# Patient Record
Sex: Female | Born: 1935 | Race: White | Hispanic: No | Smoking: Never smoker
Health system: Southern US, Community
[De-identification: ages and names within clinical notes are randomized; demographics above are authoritative.]

## PROBLEM LIST (undated history)

## (undated) DIAGNOSIS — R928 Other abnormal and inconclusive findings on diagnostic imaging of breast: Secondary | ICD-10-CM

## (undated) DIAGNOSIS — M1990 Unspecified osteoarthritis, unspecified site: Secondary | ICD-10-CM

## (undated) DIAGNOSIS — I219 Acute myocardial infarction, unspecified: Secondary | ICD-10-CM

## (undated) DIAGNOSIS — M810 Age-related osteoporosis without current pathological fracture: Secondary | ICD-10-CM

## (undated) HISTORY — DX: Other abnormal and inconclusive findings on diagnostic imaging of breast: R92.8

## (undated) HISTORY — PX: APPENDECTOMY: SHX54

## (undated) HISTORY — DX: Unspecified osteoarthritis, unspecified site: M19.90

## (undated) HISTORY — PX: CARDIAC CATHETERIZATION: SHX172

## (undated) HISTORY — DX: Age-related osteoporosis without current pathological fracture: M81.0

---

## 2004-08-12 ENCOUNTER — Ambulatory Visit: Payer: Self-pay | Admitting: Internal Medicine

## 2005-09-19 ENCOUNTER — Ambulatory Visit: Payer: Self-pay | Admitting: Internal Medicine

## 2006-11-15 ENCOUNTER — Ambulatory Visit: Payer: Self-pay | Admitting: Internal Medicine

## 2007-11-19 ENCOUNTER — Ambulatory Visit: Payer: Self-pay | Admitting: Internal Medicine

## 2008-11-19 ENCOUNTER — Ambulatory Visit: Payer: Self-pay | Admitting: Internal Medicine

## 2009-11-23 ENCOUNTER — Ambulatory Visit: Payer: Self-pay | Admitting: Internal Medicine

## 2010-06-26 DIAGNOSIS — R928 Other abnormal and inconclusive findings on diagnostic imaging of breast: Secondary | ICD-10-CM

## 2010-06-26 HISTORY — DX: Other abnormal and inconclusive findings on diagnostic imaging of breast: R92.8

## 2010-08-31 ENCOUNTER — Ambulatory Visit: Payer: Self-pay | Admitting: Ophthalmology

## 2010-12-07 ENCOUNTER — Ambulatory Visit: Payer: Self-pay | Admitting: Internal Medicine

## 2010-12-08 ENCOUNTER — Ambulatory Visit: Payer: Self-pay | Admitting: Internal Medicine

## 2011-06-12 ENCOUNTER — Ambulatory Visit: Payer: Self-pay | Admitting: Surgery

## 2011-06-27 HISTORY — PX: BREAST BIOPSY: SHX20

## 2011-07-03 ENCOUNTER — Ambulatory Visit: Payer: Self-pay | Admitting: Surgery

## 2011-07-04 LAB — PATHOLOGY REPORT

## 2012-02-01 ENCOUNTER — Ambulatory Visit: Payer: Self-pay | Admitting: Internal Medicine

## 2013-02-03 ENCOUNTER — Ambulatory Visit: Payer: Self-pay | Admitting: Internal Medicine

## 2013-11-07 ENCOUNTER — Emergency Department: Payer: Self-pay | Admitting: Emergency Medicine

## 2014-02-24 DIAGNOSIS — M1990 Unspecified osteoarthritis, unspecified site: Secondary | ICD-10-CM | POA: Insufficient documentation

## 2015-03-04 ENCOUNTER — Other Ambulatory Visit: Payer: Self-pay | Admitting: Family Medicine

## 2015-03-04 DIAGNOSIS — Z1231 Encounter for screening mammogram for malignant neoplasm of breast: Secondary | ICD-10-CM

## 2015-03-04 DIAGNOSIS — M810 Age-related osteoporosis without current pathological fracture: Secondary | ICD-10-CM | POA: Insufficient documentation

## 2015-03-10 ENCOUNTER — Other Ambulatory Visit: Payer: Self-pay | Admitting: Family Medicine

## 2015-03-10 ENCOUNTER — Ambulatory Visit
Admission: RE | Admit: 2015-03-10 | Discharge: 2015-03-10 | Disposition: A | Discharge status: Home / Self Care | Payer: Medicare Other | Source: Ambulatory Visit | Attending: Family Medicine | Admitting: Family Medicine

## 2015-03-10 DIAGNOSIS — Z1231 Encounter for screening mammogram for malignant neoplasm of breast: Secondary | ICD-10-CM | POA: Diagnosis not present

## 2015-03-11 HISTORY — PX: SKIN BIOPSY: SHX1

## 2015-03-24 ENCOUNTER — Encounter: Payer: Self-pay | Admitting: General Surgery

## 2015-03-24 ENCOUNTER — Telehealth: Payer: Self-pay | Admitting: General Surgery

## 2015-03-24 ENCOUNTER — Ambulatory Visit (INDEPENDENT_AMBULATORY_CARE_PROVIDER_SITE_OTHER): Payer: Medicare Other | Admitting: General Surgery

## 2015-03-24 VITALS — BP 194/80 | HR 88 | Temp 88.0°F | Ht 66.0 in | Wt 125.0 lb

## 2015-03-24 DIAGNOSIS — C439 Malignant melanoma of skin, unspecified: Secondary | ICD-10-CM

## 2015-03-24 DIAGNOSIS — Z01818 Encounter for other preprocedural examination: Secondary | ICD-10-CM

## 2015-03-24 NOTE — Telephone Encounter (Signed)
Pt advised of pre op date/time and sx date in the office by the Nurse. Sx: 03/26/15 with Dr Faythe Ghee excision right upper back. Pre op: 03/25/15 @11 :00am.--office.

## 2015-03-24 NOTE — Progress Notes (Signed)
Patient ID: Tanya Roy, female   DOB: 1928/12/09, 81 y.o.   MRN: [id number] CC: Melanoma HPI Tanya Roy is a 81 y.o. female presents to clinic for evaluation of a left upper back melanoma. Patient reports that she is in a usual state of good health until her annual follow-up with her primary care doctor discovered a area of concern of her left upper back. 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 She is otherwise in excellent health. She denies any chronic medications other than over-the-counter multivitamin. She's never had any heart disease. Her only recent medical misadventure was a hip fracture which did not require surgery. She denies any fevers, chills, nausea, vomiting, chest pain, shortness of breath, or any other malady. She tolerated the office excisional biopsy without any difficulty.  HPI  Past Medical History  Diagnosis Date  . Osteoporosis   . Arthritis   . Abnormal mammogram of right breast 2012    Microcalcifications    Past Surgical History  Procedure Laterality Date  . Skin biopsy  03/11/2015    Back  . Breast biopsy Right 2013    stereo, neg  . Appendectomy      Family History  Problem Relation Age of Onset  . Tuberculosis Mother   . Heart disease Father     Social History Social History  Substance Use Topics  . Smoking status: Never Smoker   . Smokeless tobacco: None  . Alcohol Use: No    No Known Allergies  Current Outpatient Prescriptions  Medication Sig Dispense Refill  . Multiple Vitamin (MULTIVITAMIN) tablet Take 1 tablet by mouth daily.     No current facility-administered medications for this visit.     Review of Systems A multi-point review of systems was asked and was negative except for the following positive findings: See history of present illness  Physical Exam Blood pressure 194/80, pulse 88, temperature 88  F (31.1 C), temperature source Oral, height 5\' 6"  (1.676 m), weight 56.7 kg (125 lb). CONSTITUTIONAL: No acute distress. EYES: Pupils are equal, round, and reactive to light, Sclera are non-icteric. EARS, NOSE, MOUTH AND THROAT: The oropharynx is clear. The oral mucosa is pink and moist. Hearing is intact to voice. LYMPH NODES:  Lymph nodes in the neck are normal. RESPIRATORY:  Lungs are clear. There is normal respiratory effort, with equal breath sounds bilaterally, and without pathologic use of accessory muscles. CARDIOVASCULAR: Heart is regular without murmurs, gallops, or rubs. GI: The abdomen is soft, nontender, and nondistended. There are no palpable masses. There is no hepatosplenomegaly. There are normal bowel sounds in all quadrants. GU: Rectal deferred.   MUSCULOSKELETAL: Normal muscle strength and tone. No cyanosis or edema.   SKIN: Left upper back with an obvious prior excisional biopsy site. There is a large scab and no evidence of prior attempted closure. The margins around the scab are a pigmented brown in every direction. There are multiple other moles on her back that he does not have any concerning features. They are round, small, with consistent coloring and borders. NEUROLOGIC: Motor and sensation is grossly normal. Cranial nerves are grossly intact. PSYCH:  Oriented to person, place and time. Affect is normal.  Data Reviewed I personally reviewed the pathology report provided by the primary care doctor shows a melanoma in situ with a positive lateral margin. The specimen was not orientated per the pathologist there for  which margin is impossible to distract. The maximum depth of invasion is 0.16 mm. I have personally reviewed the patient's imaging, laboratory findings and medical records.    Assessment    81 year old female with melanoma in situ and a positive margin.    Plan    Discussed with the patient and her niece diagnosis of melanoma in situ and the positive  margin from her excisional biopsy. Discussed given that we cannot say laterality the entirety of the previous excision sites to be reexcised. The procedure was discussed in detail including that it would be safer and easier the patient be performed in the operating room rather than clinic. There is open and available operating time on this Friday is also an opening in my clinic to allow to do this.  The procedure of wide local excision for margins was discussed in detail to include the risks, benefits, alternatives. Patient and her niece both voiced understanding wished proceed. We will perform preoperative workup today with plans for excisional biopsy on Friday.     Time spent with the patient was 30 minutes, with more than 50% of the time spent in face-to-face education, counseling and care coordination.     Clayburn Pert 03/24/2015, 11:14 AM

## 2015-03-24 NOTE — Patient Instructions (Signed)
Angie will be contacting to let you know when your surgery will be. If you have any questions or concerns before your surgery, please give [place] a call.

## 2015-03-25 ENCOUNTER — Encounter
Admission: RE | Admit: 2015-03-25 | Discharge: 2015-03-25 | Disposition: A | Discharge status: Home / Self Care | Payer: Medicare Other | Source: Ambulatory Visit | Attending: General Surgery | Admitting: General Surgery

## 2015-03-25 DIAGNOSIS — M1990 Unspecified osteoarthritis, unspecified site: Secondary | ICD-10-CM | POA: Diagnosis not present

## 2015-03-25 DIAGNOSIS — D0361 Melanoma in situ of right upper limb, including shoulder: Secondary | ICD-10-CM | POA: Diagnosis not present

## 2015-03-25 DIAGNOSIS — Z8249 Family history of ischemic heart disease and other diseases of the circulatory system: Secondary | ICD-10-CM | POA: Diagnosis not present

## 2015-03-25 DIAGNOSIS — C439 Malignant melanoma of skin, unspecified: Secondary | ICD-10-CM | POA: Diagnosis present

## 2015-03-25 DIAGNOSIS — Z79899 Other long term (current) drug therapy: Secondary | ICD-10-CM | POA: Diagnosis not present

## 2015-03-25 DIAGNOSIS — D2261 Melanocytic nevi of right upper limb, including shoulder: Secondary | ICD-10-CM | POA: Diagnosis not present

## 2015-03-25 DIAGNOSIS — M810 Age-related osteoporosis without current pathological fracture: Secondary | ICD-10-CM | POA: Diagnosis not present

## 2015-03-25 DIAGNOSIS — Z836 Family history of other diseases of the respiratory system: Secondary | ICD-10-CM | POA: Diagnosis not present

## 2015-03-25 DIAGNOSIS — R920 Mammographic microcalcification found on diagnostic imaging of breast: Secondary | ICD-10-CM | POA: Diagnosis not present

## 2015-03-25 LAB — PROTIME-INR
INR: 1.06
Prothrombin Time: 14 seconds (ref 11.4–15.0)

## 2015-03-25 LAB — CBC WITH DIFFERENTIAL/PLATELET
BASOS ABS: 0 10*3/uL (ref 0–0.1)
BASOS PCT: 1 %
Eosinophils Absolute: 0.1 10*3/uL (ref 0–0.7)
Eosinophils Relative: 2 %
HEMATOCRIT: 41.3 % (ref 35.0–47.0)
HEMOGLOBIN: 13.9 g/dL (ref 12.0–16.0)
Lymphocytes Relative: 19 %
Lymphs Abs: 1.2 10*3/uL (ref 1.0–3.6)
MCH: 32.9 pg (ref 26.0–34.0)
MCHC: 33.7 g/dL (ref 32.0–36.0)
MCV: 97.7 fL (ref 80.0–100.0)
Monocytes Absolute: 0.5 10*3/uL (ref 0.2–0.9)
Monocytes Relative: 9 %
NEUTROS ABS: 4.6 10*3/uL (ref 1.4–6.5)
NEUTROS PCT: 71 %
Platelets: 177 10*3/uL (ref 150–440)
RBC: 4.23 MIL/uL (ref 3.80–5.20)
RDW: 12.9 % (ref 11.5–14.5)
WBC: 6.5 10*3/uL (ref 3.6–11.0)

## 2015-03-25 LAB — BASIC METABOLIC PANEL
ANION GAP: 5 (ref 5–15)
BUN: 20 mg/dL (ref 6–20)
CALCIUM: 8.9 mg/dL (ref 8.9–10.3)
CO2: 27 mmol/L (ref 22–32)
Chloride: 105 mmol/L (ref 101–111)
Creatinine, Ser: 0.86 mg/dL (ref 0.44–1.00)
Glucose, Bld: 102 mg/dL — ABNORMAL HIGH (ref 65–99)
Potassium: 3.9 mmol/L (ref 3.5–5.1)
Sodium: 137 mmol/L (ref 135–145)

## 2015-03-25 NOTE — OR Nursing (Signed)
Ekg called to Dr Ronelle Nigh and ok to proceed

## 2015-03-25 NOTE — Patient Instructions (Signed)
  Your procedure is scheduled on: 03/26/15 Report to Day Surgery. [facility]OND FLOOR To find out your arrival time please call [phone] between 1PM - 3PM on 03/25/15.  Remember: Instructions that are not followed completely may result in serious medical risk, up to and including death, or upon the discretion of your surgeon and anesthesiologist your surgery may need to be rescheduled.    __X__ 1. Do not eat food or drink liquids after midnight. No gum chewing or hard candies.     _X___ 2. No Alcohol for 24 hours before or after surgery.   ____ 3. Bring all medications with you on the day of surgery if instructed.    __X__ 4. Notify your doctor if there is any change in your medical condition     (cold, fever, infections).     Do not wear jewelry, make-up, hairpins, clips or nail polish.  Do not wear lotions, powders, or perfumes. You may wear deodorant.  Do not shave 48 hours prior to surgery. Men may shave face and neck.  Do not bring valuables to the hospital.    [facility] is not responsible for any belongings or valuables.               Contacts, dentures or bridgework may not be worn into surgery.  Leave your suitcase in the car. After surgery it may be brought to your room.  For patients admitted to the hospital, discharge time is determined by your                treatment team.   Patients discharged the day of surgery will not be allowed to drive home.   Please read over the following fact sheets that you were given:   Surgical Site Infection Prevention   ____ Take these medicines the morning of surgery with A SIP OF WATER:    1.   2.   3.   4.  5.  6.  ____ Fleet Enema (as directed)   __X__ Use CHG Soap as directed  ____ Use inhalers on the day of surgery  ____ Stop metformin 2 days prior to surgery    ____ Take 1/2 of usual insulin dose the night before surgery and none on the morning of surgery.   ____ Stop Coumadin/Plavix/aspirin on  ____  Stop Anti-inflammatories on    ____ Stop supplements until after surgery.    ____ Bring C-Pap to the hospital.

## 2015-03-26 ENCOUNTER — Ambulatory Visit
Admission: RE | Admit: 2015-03-26 | Discharge: 2015-03-26 | Disposition: A | Discharge status: Home / Self Care | Payer: Medicare Other | Source: Ambulatory Visit | Attending: General Surgery | Admitting: General Surgery

## 2015-03-26 ENCOUNTER — Ambulatory Visit: Payer: Medicare Other | Admitting: *Deleted

## 2015-03-26 ENCOUNTER — Encounter: Payer: Self-pay | Admitting: *Deleted

## 2015-03-26 ENCOUNTER — Encounter
Admission: RE | Disposition: A | Discharge status: Home / Self Care | Payer: Self-pay | Source: Ambulatory Visit | Attending: General Surgery

## 2015-03-26 DIAGNOSIS — D0361 Melanoma in situ of right upper limb, including shoulder: Secondary | ICD-10-CM | POA: Diagnosis not present

## 2015-03-26 DIAGNOSIS — R920 Mammographic microcalcification found on diagnostic imaging of breast: Secondary | ICD-10-CM | POA: Insufficient documentation

## 2015-03-26 DIAGNOSIS — M1990 Unspecified osteoarthritis, unspecified site: Secondary | ICD-10-CM | POA: Insufficient documentation

## 2015-03-26 DIAGNOSIS — Z8249 Family history of ischemic heart disease and other diseases of the circulatory system: Secondary | ICD-10-CM | POA: Insufficient documentation

## 2015-03-26 DIAGNOSIS — D2261 Melanocytic nevi of right upper limb, including shoulder: Secondary | ICD-10-CM | POA: Insufficient documentation

## 2015-03-26 DIAGNOSIS — M810 Age-related osteoporosis without current pathological fracture: Secondary | ICD-10-CM | POA: Insufficient documentation

## 2015-03-26 DIAGNOSIS — Z79899 Other long term (current) drug therapy: Secondary | ICD-10-CM | POA: Insufficient documentation

## 2015-03-26 DIAGNOSIS — D0362 Melanoma in situ of left upper limb, including shoulder: Secondary | ICD-10-CM | POA: Diagnosis not present

## 2015-03-26 DIAGNOSIS — Z836 Family history of other diseases of the respiratory system: Secondary | ICD-10-CM | POA: Insufficient documentation

## 2015-03-26 HISTORY — PX: MELANOMA EXCISION: SHX5266

## 2015-03-26 SURGERY — EXCISION, MELANOMA
Anesthesia: Monitor Anesthesia Care | Site: Back | Laterality: Right | Wound class: Clean

## 2015-03-26 MED ORDER — CHLORHEXIDINE GLUCONATE 4 % EX LIQD: 1.0000 "application " | Freq: Once | CUTANEOUS | Status: DC

## 2015-03-26 MED ORDER — BUPIVACAINE HCL 0.5 % IJ SOLN
INTRAMUSCULAR | Status: DC | PRN
  Administered 2015-03-26: 10 mL

## 2015-03-26 MED ORDER — LACTATED RINGERS IV SOLN
INTRAVENOUS | Status: DC
  Administered 2015-03-26: 13:00:00 via INTRAVENOUS

## 2015-03-26 MED ORDER — BUPIVACAINE HCL (PF) 0.5 % IJ SOLN
INTRAMUSCULAR | Status: AC
Start: 2015-03-26 — End: 2015-03-26
  Filled 2015-03-26: qty 30

## 2015-03-26 MED ORDER — CEFAZOLIN SODIUM-DEXTROSE 2-3 GM-% IV SOLR: 2.0000 g | INTRAVENOUS | Status: DC

## 2015-03-26 MED ORDER — BUPIVACAINE-EPINEPHRINE (PF) 0.25% -1:200000 IJ SOLN
INTRAMUSCULAR | Status: AC
  Filled 2015-03-26: qty 30

## 2015-03-26 MED ORDER — CEFAZOLIN SODIUM-DEXTROSE 2-3 GM-% IV SOLR
INTRAVENOUS | Status: AC
  Filled 2015-03-26: qty 50

## 2015-03-26 MED ORDER — LIDOCAINE HCL 1 % IJ SOLN
INTRAMUSCULAR | Status: DC | PRN
  Administered 2015-03-26: 10 mL

## 2015-03-26 MED ORDER — FENTANYL CITRATE (PF) 100 MCG/2ML IJ SOLN: 25.0000 ug | INTRAMUSCULAR | Status: DC | PRN

## 2015-03-26 MED ORDER — CEFAZOLIN SODIUM-DEXTROSE 2-3 GM-% IV SOLR
2.0000 g | INTRAVENOUS | Status: AC
  Administered 2015-03-26: 2 g via INTRAVENOUS

## 2015-03-26 MED ORDER — HYDROCODONE-ACETAMINOPHEN 5-325 MG PO TABS: 1.0000 | ORAL_TABLET | Freq: Four times a day (QID) | ORAL | Status: DC | PRN

## 2015-03-26 MED ORDER — FAMOTIDINE 20 MG PO TABS
20.0000 mg | ORAL_TABLET | Freq: Once | ORAL | Status: AC
  Administered 2015-03-26: 20 mg via ORAL

## 2015-03-26 MED ORDER — DEXAMETHASONE SODIUM PHOSPHATE 4 MG/ML IJ SOLN
8.0000 mg | Freq: Once | INTRAMUSCULAR | Status: DC | PRN
  Filled 2015-03-26: qty 2

## 2015-03-26 MED ORDER — LIDOCAINE HCL (PF) 1 % IJ SOLN
INTRAMUSCULAR | Status: AC
  Filled 2015-03-26: qty 30

## 2015-03-26 MED ORDER — FAMOTIDINE 20 MG PO TABS
ORAL_TABLET | ORAL | Status: AC
  Administered 2015-03-26: 20 mg via ORAL
  Filled 2015-03-26: qty 1

## 2015-03-26 MED ORDER — MIDAZOLAM HCL 2 MG/2ML IJ SOLN
INTRAMUSCULAR | Status: DC | PRN
  Administered 2015-03-26 (×2): 1 mg via INTRAVENOUS

## 2015-03-26 MED ORDER — FENTANYL CITRATE (PF) 100 MCG/2ML IJ SOLN
INTRAMUSCULAR | Status: DC | PRN
  Administered 2015-03-26 (×2): 50 ug via INTRAVENOUS

## 2015-03-26 SURGICAL SUPPLY — 27 items
BLADE SURG 15 STRL LF DISP TIS (BLADE) ×1 IMPLANT
BLADE SURG 15 STRL SS (BLADE) ×2
CHLORAPREP W/TINT 26ML (MISCELLANEOUS) ×3 IMPLANT
DRAIN PENROSE 1/4X12 LTX (DRAIN) IMPLANT
DRAPE LAPAROTOMY 100X77 ABD (DRAPES) ×3 IMPLANT
DRAPE SHEET LG 3/4 BI-LAMINATE (DRAPES) ×3 IMPLANT
ELECT CAUTERY BLADE 6.4 (BLADE) ×3 IMPLANT
GLOVE BIO SURGEON STRL SZ8 (GLOVE) ×3 IMPLANT
GOWN STRL REUS W/ TWL LRG LVL3 (GOWN DISPOSABLE) ×2 IMPLANT
GOWN STRL REUS W/TWL LRG LVL3 (GOWN DISPOSABLE) ×4
LABEL OR SOLS (LABEL) IMPLANT
MARGIN MAP 10MM (MISCELLANEOUS) IMPLANT
NEEDLE HYPO 22GX1.5 SAFETY (NEEDLE) ×3 IMPLANT
NS IRRIG 500ML POUR BTL (IV SOLUTION) ×3 IMPLANT
PACK BASIN MINOR ARMC (MISCELLANEOUS) ×3 IMPLANT
PAD ABD DERMACEA PRESS 5X9 (GAUZE/BANDAGES/DRESSINGS) IMPLANT
PAD GROUND ADULT SPLIT (MISCELLANEOUS) ×3 IMPLANT
SPONGE LAP 18X18 5 PK (GAUZE/BANDAGES/DRESSINGS) ×3 IMPLANT
SUT ETHILON 2 0 FS 18 (SUTURE) ×6 IMPLANT
SUT ETHILON 3-0 FS-10 30 BLK (SUTURE)
SUT SILK 2 0 SH (SUTURE) ×3 IMPLANT
SUT VIC AB 2-0 CT2 27 (SUTURE) IMPLANT
SUT VIC AB 3-0 SH 27 (SUTURE)
SUT VIC AB 3-0 SH 27X BRD (SUTURE) IMPLANT
SUTURE EHLN 3-0 FS-10 30 BLK (SUTURE) IMPLANT
SYR BULB EAR ULCER 3OZ GRN STR (SYRINGE) IMPLANT
SYRINGE 10CC LL (SYRINGE) ×3 IMPLANT

## 2015-03-26 NOTE — Anesthesia Preprocedure Evaluation (Signed)
Anesthesia Evaluation  Patient identified by MRN, date of birth, ID band Patient awake    Reviewed: Allergy & Precautions, NPO status , Patient's Chart, lab work & pertinent test results  Airway Mallampati: II  TM Distance: >3 FB Neck ROM: Limited    Dental  (+) Partial Lower   Pulmonary    Pulmonary exam normal        Cardiovascular Exercise Tolerance: Good negative cardio ROS Normal cardiovascular exam     Neuro/Psych    GI/Hepatic negative GI ROS,   Endo/Other    Renal/GU      Musculoskeletal  (+) Arthritis , Osteoarthritis,  Hx of osteoporosis.   Abdominal (+)  Abdomen: soft.    Peds  Hematology   Anesthesia Other Findings   Reproductive/Obstetrics                             Anesthesia Physical Anesthesia Plan  ASA: II  Anesthesia Plan: MAC   Post-op Pain Management:    Induction: Intravenous  Airway Management Planned: Nasal Cannula and Simple Face Mask  Additional Equipment:   Intra-op Plan:   Post-operative Plan:   Informed Consent:   Plan Discussed with: CRNA  Anesthesia Plan Comments: (If excision is not wide appropriate for MAC local, prone--if needed we can provide GA.)        Anesthesia Quick Evaluation

## 2015-03-26 NOTE — Anesthesia Postprocedure Evaluation (Signed)
  Anesthesia Post-op Note  Patient: Tanya Roy  Procedure(s) Performed: Procedure(s): MELANOMA EXCISION (Right)  Anesthesia type:MAC  Patient location: PACU  Post pain: Pain level controlled  Post assessment: Post-op Vital signs reviewed, Patient's Cardiovascular Status Stable, Respiratory Function Stable, Patent Airway and No signs of Nausea or vomiting  Post vital signs: Reviewed and stable  Last Vitals:  Filed Vitals:   03/26/15 1417  BP: 148/67  Pulse: 65  Temp:   Resp: 16    Level of consciousness: awake, alert  and patient cooperative  Complications: No apparent anesthesia complications

## 2015-03-26 NOTE — Transfer of Care (Signed)
Immediate Anesthesia Transfer of Care Note  Patient: Tanya Roy  Procedure(s) Performed: Procedure(s): MELANOMA EXCISION (Right)  Patient Location: PACU  Anesthesia Type:MAC  Level of Consciousness: awake, alert  and oriented  Airway & Oxygen Therapy: Patient Spontanous Breathing and Patient connected to nasal cannula oxygen  Post-op Assessment: Report given to RN and Post -op Vital signs reviewed and stable  Post vital signs: Reviewed and stable  Last Vitals:  Filed Vitals:   03/26/15 1400  BP: 140/87  Pulse: 70  Temp: 36.9 C  Resp: 15    Complications: No apparent anesthesia complications

## 2015-03-26 NOTE — Op Note (Signed)
Wide Local Excision of Melanoma Site  Pre-operative Diagnosis: Melanoma In-situ  Post-operative Diagnosis: Same  Surgeon: Juanda Crumble T. Adonis Huguenin, MD FACS  Anesthesia: Moderate Sedation with Local Anesthetic  Assistant:none  Procedure Details  The patient was seen again in the Holding Room. The benefits, complications, treatment options, and expected outcomes were discussed with the patient. The risks of bleeding, infection, and possible need for further surgery were reviewed with the patient. The likelihood of improving the patient's symptoms with return to their baseline status is good.  The patient and/or family concurred with the proposed plan, giving informed consent.  The patient was taken to Operating Room, identified as Tanya Roy and the procedure verified.  A Time Out was held and the above information confirmed.  Prior to the induction of anesthesia, antibiotic prophylaxis was administered. VTE prophylaxis was in place. Moderate Sedation was then administered and tolerated well. After the induction, the back was prepped with Chloraprep and draped in the sterile fashion. The patient was positioned in the prone position.   The prior melanoma excision site was marked with a 5 mm margin in all directions. Once the entirety of the specimen was measured the transverse direction was then doubled and with for a 2-1 ratio. Once this was done the entire area was localized with a 50-50 mixture of 1% lidocaine and 0.5% Marcaine then using a 15 blade scalpel marked skin site was incised down to the subcutaneous fat and sharply removed from all the deeper tissues. The specimen was then marked with a silk suture long lateral and short superior. The area was made hemostatic with direct electrocautery. The entire operative site was then re-localized the aforementioned local anesthetic. Subcutaneous flaps were then created at the center of the incision site to ensure that the skin edges would meet in the  middle. Then using interrupted 2-0 nylon suture in a vertical mattress fashion the skin was closed with ease.  A sterile dressing of gauze and tape was placed over this. Patient tolerated procedure well was awoken from monitored anesthesia and transferred to the PACU in good condition.  Findings:   Prior Melanoma site   Estimated Blood Loss: 20         Drains: none         Specimens: left shoulder melanoma           Complications: none              Charles T. Adonis Huguenin, MD, FACS

## 2015-03-26 NOTE — Interval H&P Note (Signed)
History and Physical Interval Note:  03/26/2015 12:46 PM  Tanya Roy  has presented today for surgery, with the diagnosis of Melanoma In-Situ  The various methods of treatment have been discussed with the patient and family. After consideration of risks, benefits and other options for treatment, the patient has consented to  Procedure(s): MELANOMA EXCISION (N/A) as a surgical intervention .  The patient's history has been reviewed, patient examined, no change in status, stable for surgery.  I have reviewed the patient's chart and labs.  Questions were answered to the patient's satisfaction.     Clayburn Pert

## 2015-03-26 NOTE — H&P (View-Only) (Signed)
Patient ID: [name], female   DOB: 1929-06-09, 81 y.o.   MRN: [id number] CC: Melanoma HPI Tanya Roy is a 81 y.o. female presents to clinic for evaluation of a left upper back melanoma. Patient reports that she is in a usual state of good health until her annual follow-up with her primary care doctor discovered a area of concern of her left upper back. 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 She is otherwise in excellent health. She denies any chronic medications other than over-the-counter multivitamin. She's never had any heart disease. Her only recent medical misadventure was a hip fracture which did not require surgery. She denies any fevers, chills, nausea, vomiting, chest pain, shortness of breath, or any other malady. She tolerated the office excisional biopsy without any difficulty.  HPI  Past Medical History  Diagnosis Date  . Osteoporosis   . Arthritis   . Abnormal mammogram of right breast 2012    Microcalcifications    Past Surgical History  Procedure Laterality Date  . Skin biopsy  03/11/2015    Back  . Breast biopsy Right 2013    stereo, neg  . Appendectomy      Family History  Problem Relation Age of Onset  . Tuberculosis Mother   . Heart disease Father     Social History Social History  Substance Use Topics  . Smoking status: Never Smoker   . Smokeless tobacco: None  . Alcohol Use: No    No Known Allergies  Current Outpatient Prescriptions  Medication Sig Dispense Refill  . Multiple Vitamin (MULTIVITAMIN) tablet Take 1 tablet by mouth daily.     No current facility-administered medications for this visit.     Review of Systems A multi-point review of systems was asked and was negative except for the following positive findings: See history of present illness  Physical Exam Blood pressure 194/80, pulse 88, temperature 88  F (31.1 C), temperature source Oral, height 5\' 6"  (1.676 m), weight 56.7 kg (125 lb). CONSTITUTIONAL: No acute distress. EYES: Pupils are equal, round, and reactive to light, Sclera are non-icteric. EARS, NOSE, MOUTH AND THROAT: The oropharynx is clear. The oral mucosa is pink and moist. Hearing is intact to voice. LYMPH NODES:  Lymph nodes in the neck are normal. RESPIRATORY:  Lungs are clear. There is normal respiratory effort, with equal breath sounds bilaterally, and without pathologic use of accessory muscles. CARDIOVASCULAR: Heart is regular without murmurs, gallops, or rubs. GI: The abdomen is soft, nontender, and nondistended. There are no palpable masses. There is no hepatosplenomegaly. There are normal bowel sounds in all quadrants. GU: Rectal deferred.   MUSCULOSKELETAL: Normal muscle strength and tone. No cyanosis or edema.   SKIN: Left upper back with an obvious prior excisional biopsy site. There is a large scab and no evidence of prior attempted closure. The margins around the scab are a pigmented brown in every direction. There are multiple other moles on her back that he does not have any concerning features. They are round, small, with consistent coloring and borders. NEUROLOGIC: Motor and sensation is grossly normal. Cranial nerves are grossly intact. PSYCH:  Oriented to person, place and time. Affect is normal.  Data Reviewed I personally reviewed the pathology report provided by the primary care doctor shows a melanoma in situ with a positive lateral margin. The specimen was not orientated per the pathologist there for  which margin is impossible to distract. The maximum depth of invasion is 0.16 mm. I have personally reviewed the patient's imaging, laboratory findings and medical records.    Assessment    81 year old female with melanoma in situ and a positive margin.    Plan    Discussed with the patient and her niece diagnosis of melanoma in situ and the positive  margin from her excisional biopsy. Discussed given that we cannot say laterality the entirety of the previous excision sites to be reexcised. The procedure was discussed in detail including that it would be safer and easier the patient be performed in the operating room rather than clinic. There is open and available operating time on this Friday is also an opening in my clinic to allow to do this.  The procedure of wide local excision for margins was discussed in detail to include the risks, benefits, alternatives. Patient and her niece both voiced understanding wished proceed. We will perform preoperative workup today with plans for excisional biopsy on Friday.     Time spent with the patient was 30 minutes, with more than 50% of the time spent in face-to-face education, counseling and care coordination.     Tanya Roy 03/24/2015, 11:14 AM

## 2015-03-26 NOTE — Brief Op Note (Signed)
03/26/2015  1:54 PM  PATIENT:  [name]  81 y.o. female  PRE-OPERATIVE DIAGNOSIS:  Melanoma In-Situ  POST-OPERATIVE DIAGNOSIS:  Melanoma In-Situ  PROCEDURE:  Procedure(s): MELANOMA EXCISION (Right)  SURGEON:  Surgeon(s) and Role:    * Clayburn Pert, MD - Primary  PHYSICIAN ASSISTANT:   ASSISTANTS: none   ANESTHESIA:   MAC  EBL:  Total I/O In: 50 [I.V.:50] Out: -   BLOOD ADMINISTERED:none  DRAINS: none   LOCAL MEDICATIONS USED:  MARCAINE   , XYLOCAINE  and Amount: 20 ml  SPECIMEN:  Excision of prior melanoma site  DISPOSITION OF SPECIMEN:  PATHOLOGY  COUNTS:  YES  TOURNIQUET:  * No tourniquets in log *  DICTATION: .Dragon Dictation  PLAN OF CARE: Discharge to home after PACU  PATIENT DISPOSITION:  PACU - hemodynamically stable.   Delay start of Pharmacological VTE agent (>24hrs) due to surgical blood loss or risk of bleeding: not applicable

## 2015-04-01 LAB — SURGICAL PATHOLOGY

## 2015-04-06 ENCOUNTER — Telehealth: Payer: Self-pay

## 2015-04-06 NOTE — Telephone Encounter (Addendum)
Received phone call from patient's niece upset that she has not heard results or patient's pathology from Melanoma surgery. I explained that this was reviewed by the physician and will be reviewed with patient at follow-up appointment. She states that she will be attending her follow-up appointment alone.   Reviewed pathology over the phone but explained that I am not a physician and cannot give any recommendations for anything further needed.  I explained that I would like to move up her appointment with Dr. Adonis Huguenin tomorrow so that she can speak with him, she is unable to get patient to an appointment in [facility] office at all and this is why she is seeing Dr. Marina Gravel in [place] on 04/14/15.  She would like for Dr. Adonis Huguenin to know "that we dropped the ball on this and he needs to call her to speak with her about results and recommendations." I explained that I would get in touch with Dr. Adonis Huguenin and the Physician or someone from our office would be returning the phone call.

## 2015-04-06 NOTE — Telephone Encounter (Signed)
Notified Dr. Adonis Huguenin. He will call patient's niece tomorrow when he is in clinic.

## 2015-04-07 ENCOUNTER — Telehealth: Payer: Self-pay | Admitting: General Surgery

## 2015-04-07 NOTE — Telephone Encounter (Signed)
Called niece and apologized for not calling last week due to the delay in results from the biopsy.  Discussed pathology results in detail in that it showed melanoma in situ with a 4-55mm clear margin in all directions. Discussed that all that remains to do is to remove the sutures and then to continue observation of all of her skin to watch for more melanoma formations or local recurrence.  She voiced understanding and states that she will bring her Aunt to clinic next week for a wound check and suture removal.  Clayburn Pert, MD [facility]

## 2015-04-09 ENCOUNTER — Telehealth: Payer: Self-pay | Admitting: Surgery

## 2015-04-09 NOTE — Telephone Encounter (Signed)
Patient left voice message that she had a phone call from our office.

## 2015-04-09 NOTE — Telephone Encounter (Signed)
Called patient back and asked how I could help her. She stated that she had seen that somebody from our office had contacted her. I told her that she might of received a call to remind her of her appointment for Wednesday 04/14/2015 with Dr. Marina Gravel at 1:45 PM. Patient repeated the appointment information and stated that she will come to her appointment. I also confirmed with her that it was in the [place] office at the [employer] building. Patient understood.

## 2015-04-14 ENCOUNTER — Encounter: Payer: Self-pay | Admitting: Surgery

## 2015-04-14 ENCOUNTER — Encounter (INDEPENDENT_AMBULATORY_CARE_PROVIDER_SITE_OTHER): Payer: Self-pay

## 2015-04-14 ENCOUNTER — Ambulatory Visit (INDEPENDENT_AMBULATORY_CARE_PROVIDER_SITE_OTHER): Payer: Medicare Other | Admitting: Surgery

## 2015-04-14 VITALS — BP 187/81 | HR 64 | Temp 97.5°F | Ht 66.0 in | Wt 128.0 lb

## 2015-04-14 DIAGNOSIS — C439 Malignant melanoma of skin, unspecified: Secondary | ICD-10-CM

## 2015-04-14 NOTE — Progress Notes (Signed)
Surgery clinic.  She is here for suture removal following wide local excision of right upper back melanoma in situ. Pathology was discussed with her by Dr. Adonis Huguenin over the telephone.  Physical examination her incision is healed nicely. Sutures were removed.  We will see her back in the office as needed I encouraged her to continue routine dermatological evaluations.

## 2015-04-14 NOTE — Patient Instructions (Signed)
Please give [place] a call if you have any questions or concerns.  Please continue to see your Dermatologist for your yearly check-ups.

## 2016-03-06 ENCOUNTER — Other Ambulatory Visit: Payer: Self-pay | Admitting: Family Medicine

## 2016-03-06 DIAGNOSIS — Z1231 Encounter for screening mammogram for malignant neoplasm of breast: Secondary | ICD-10-CM

## 2016-03-23 ENCOUNTER — Ambulatory Visit
Admission: RE | Admit: 2016-03-23 | Discharge: 2016-03-23 | Disposition: A | Discharge status: Home / Self Care | Payer: Medicare Other | Source: Ambulatory Visit | Attending: Family Medicine | Admitting: Family Medicine

## 2016-03-23 DIAGNOSIS — Z1231 Encounter for screening mammogram for malignant neoplasm of breast: Secondary | ICD-10-CM | POA: Diagnosis present

## 2016-08-29 ENCOUNTER — Inpatient Hospital Stay
Admission: EM | Admit: 2016-08-29 | Discharge: 2016-09-01 | DRG: 481 | Disposition: A | Discharge status: Skilled Nursing Facility | Payer: Medicare Other | Attending: Internal Medicine | Admitting: Internal Medicine

## 2016-08-29 ENCOUNTER — Emergency Department: Payer: Medicare Other

## 2016-08-29 ENCOUNTER — Inpatient Hospital Stay: Payer: Medicare Other | Admitting: Anesthesiology

## 2016-08-29 ENCOUNTER — Inpatient Hospital Stay: Payer: Medicare Other

## 2016-08-29 ENCOUNTER — Encounter
Admission: EM | Disposition: A | Discharge status: Skilled Nursing Facility | Payer: Self-pay | Source: Home / Self Care | Attending: Internal Medicine

## 2016-08-29 ENCOUNTER — Encounter: Payer: Self-pay | Admitting: Emergency Medicine

## 2016-08-29 DIAGNOSIS — M810 Age-related osteoporosis without current pathological fracture: Secondary | ICD-10-CM | POA: Diagnosis present

## 2016-08-29 DIAGNOSIS — W19XXXA Unspecified fall, initial encounter: Secondary | ICD-10-CM

## 2016-08-29 DIAGNOSIS — D62 Acute posthemorrhagic anemia: Secondary | ICD-10-CM | POA: Diagnosis not present

## 2016-08-29 DIAGNOSIS — Z8781 Personal history of (healed) traumatic fracture: Secondary | ICD-10-CM

## 2016-08-29 DIAGNOSIS — S72141A Displaced intertrochanteric fracture of right femur, initial encounter for closed fracture: Principal | ICD-10-CM | POA: Diagnosis present

## 2016-08-29 DIAGNOSIS — Z419 Encounter for procedure for purposes other than remedying health state, unspecified: Secondary | ICD-10-CM

## 2016-08-29 DIAGNOSIS — S52501A Unspecified fracture of the lower end of right radius, initial encounter for closed fracture: Secondary | ICD-10-CM | POA: Diagnosis present

## 2016-08-29 DIAGNOSIS — M1990 Unspecified osteoarthritis, unspecified site: Secondary | ICD-10-CM | POA: Diagnosis present

## 2016-08-29 DIAGNOSIS — Z79899 Other long term (current) drug therapy: Secondary | ICD-10-CM | POA: Diagnosis not present

## 2016-08-29 DIAGNOSIS — W010XXA Fall on same level from slipping, tripping and stumbling without subsequent striking against object, initial encounter: Secondary | ICD-10-CM | POA: Diagnosis present

## 2016-08-29 DIAGNOSIS — Y92014 Private driveway to single-family (private) house as the place of occurrence of the external cause: Secondary | ICD-10-CM | POA: Diagnosis not present

## 2016-08-29 DIAGNOSIS — Z7982 Long term (current) use of aspirin: Secondary | ICD-10-CM

## 2016-08-29 DIAGNOSIS — R030 Elevated blood-pressure reading, without diagnosis of hypertension: Secondary | ICD-10-CM | POA: Diagnosis not present

## 2016-08-29 DIAGNOSIS — Z9889 Other specified postprocedural states: Secondary | ICD-10-CM

## 2016-08-29 DIAGNOSIS — S72001A Fracture of unspecified part of neck of right femur, initial encounter for closed fracture: Secondary | ICD-10-CM

## 2016-08-29 DIAGNOSIS — S72009A Fracture of unspecified part of neck of unspecified femur, initial encounter for closed fracture: Secondary | ICD-10-CM

## 2016-08-29 HISTORY — PX: FEMUR IM NAIL: SHX1597

## 2016-08-29 HISTORY — PX: ORIF WRIST FRACTURE: SHX2133

## 2016-08-29 LAB — CBC
HEMATOCRIT: 40.3 % (ref 35.0–47.0)
HEMOGLOBIN: 13.9 g/dL (ref 12.0–16.0)
MCH: 33.1 pg (ref 26.0–34.0)
MCHC: 34.6 g/dL (ref 32.0–36.0)
MCV: 95.7 fL (ref 80.0–100.0)
Platelets: 178 10*3/uL (ref 150–440)
RBC: 4.21 MIL/uL (ref 3.80–5.20)
RDW: 13.2 % (ref 11.5–14.5)
WBC: 7.1 10*3/uL (ref 3.6–11.0)

## 2016-08-29 LAB — BASIC METABOLIC PANEL
Anion gap: 7 (ref 5–15)
BUN: 22 mg/dL — AB (ref 6–20)
CHLORIDE: 105 mmol/L (ref 101–111)
CO2: 24 mmol/L (ref 22–32)
Calcium: 8.7 mg/dL — ABNORMAL LOW (ref 8.9–10.3)
Creatinine, Ser: 0.92 mg/dL (ref 0.44–1.00)
GFR calc Af Amer: >60 mL/min (ref >60)
GFR calc non Af Amer: 55 mL/min — ABNORMAL LOW (ref >60)
GLUCOSE: 112 mg/dL — AB (ref 65–99)
POTASSIUM: 3.9 mmol/L (ref 3.5–5.1)
SODIUM: 136 mmol/L (ref 135–145)

## 2016-08-29 LAB — PROTIME-INR
INR: 1.01
PROTHROMBIN TIME: 13.3 s (ref 11.4–15.2)

## 2016-08-29 LAB — TYPE AND SCREEN
ABO/RH(D): O POS
ANTIBODY SCREEN: NEGATIVE

## 2016-08-29 LAB — APTT: aPTT: 27 seconds (ref 24–36)

## 2016-08-29 SURGERY — INSERTION, INTRAMEDULLARY ROD, FEMUR
Anesthesia: General | Laterality: Right

## 2016-08-29 MED ORDER — ADULT MULTIVITAMIN W/MINERALS CH
1.0000 | ORAL_TABLET | Freq: Every day | ORAL | Status: DC
  Administered 2016-08-30 – 2016-09-01 (×3): 1 via ORAL
  Filled 2016-08-29 (×4): qty 1

## 2016-08-29 MED ORDER — CEFAZOLIN IN D5W 1 GM/50ML IV SOLN
1.0000 g | Freq: Once | INTRAVENOUS | Status: AC
  Administered 2016-08-29: 1 g via INTRAVENOUS

## 2016-08-29 MED ORDER — ROCURONIUM BROMIDE 100 MG/10ML IV SOLN
INTRAVENOUS | Status: DC | PRN
  Administered 2016-08-29: 5 mg via INTRAVENOUS

## 2016-08-29 MED ORDER — LIDOCAINE HCL (PF) 2 % IJ SOLN
INTRAMUSCULAR | Status: AC
  Filled 2016-08-29: qty 2

## 2016-08-29 MED ORDER — NEOMYCIN-POLYMYXIN B GU 40-200000 IR SOLN
Status: DC | PRN
  Administered 2016-08-29: 4 mL

## 2016-08-29 MED ORDER — FENTANYL CITRATE (PF) 100 MCG/2ML IJ SOLN
INTRAMUSCULAR | Status: DC | PRN
  Administered 2016-08-29 (×4): 25 ug via INTRAVENOUS

## 2016-08-29 MED ORDER — SODIUM CHLORIDE 0.9 % IV SOLN
INTRAVENOUS | Status: DC | PRN
  Administered 2016-08-29: 16:00:00 via INTRAVENOUS

## 2016-08-29 MED ORDER — LIDOCAINE HCL (CARDIAC) 20 MG/ML IV SOLN
INTRAVENOUS | Status: DC | PRN
  Administered 2016-08-29: 60 mg via INTRAVENOUS

## 2016-08-29 MED ORDER — ONDANSETRON HCL 4 MG/2ML IJ SOLN
INTRAMUSCULAR | Status: DC | PRN
  Administered 2016-08-29: 4 mg via INTRAVENOUS

## 2016-08-29 MED ORDER — SUCCINYLCHOLINE CHLORIDE 20 MG/ML IJ SOLN
INTRAMUSCULAR | Status: DC | PRN
  Administered 2016-08-29: 100 mg via INTRAVENOUS

## 2016-08-29 MED ORDER — EPHEDRINE SULFATE 50 MG/ML IJ SOLN
INTRAMUSCULAR | Status: DC | PRN
  Administered 2016-08-29: 10 mg via INTRAVENOUS
  Administered 2016-08-29: 5 mg via INTRAVENOUS
  Administered 2016-08-29: 10 mg via INTRAVENOUS

## 2016-08-29 MED ORDER — PHENYLEPHRINE HCL 10 MG/ML IJ SOLN
INTRAMUSCULAR | Status: DC | PRN
  Administered 2016-08-29: 100 ug via INTRAVENOUS

## 2016-08-29 MED ORDER — ONDANSETRON HCL 4 MG/2ML IJ SOLN: 4.0000 mg | Freq: Four times a day (QID) | INTRAMUSCULAR | Status: DC | PRN

## 2016-08-29 MED ORDER — SODIUM CHLORIDE 0.9 % IV SOLN
Freq: Once | INTRAVENOUS | Status: AC
  Administered 2016-08-29: 15:00:00 via INTRAVENOUS

## 2016-08-29 MED ORDER — SUCCINYLCHOLINE CHLORIDE 20 MG/ML IJ SOLN
INTRAMUSCULAR | Status: AC
  Filled 2016-08-29: qty 1

## 2016-08-29 MED ORDER — BISACODYL 10 MG RE SUPP: 10.0000 mg | Freq: Every day | RECTAL | Status: DC | PRN

## 2016-08-29 MED ORDER — HEPARIN SODIUM (PORCINE) 5000 UNIT/ML IJ SOLN: 5000.0000 [IU] | Freq: Three times a day (TID) | INTRAMUSCULAR | Status: DC

## 2016-08-29 MED ORDER — FENTANYL CITRATE (PF) 100 MCG/2ML IJ SOLN
25.0000 ug | Freq: Once | INTRAMUSCULAR | Status: AC
  Administered 2016-08-29: 25 ug via INTRAVENOUS
  Filled 2016-08-29: qty 2

## 2016-08-29 MED ORDER — PROPOFOL 10 MG/ML IV BOLUS
INTRAVENOUS | Status: DC | PRN
  Administered 2016-08-29: 90 mg via INTRAVENOUS

## 2016-08-29 MED ORDER — ONDANSETRON HCL 4 MG PO TABS: 4.0000 mg | ORAL_TABLET | Freq: Four times a day (QID) | ORAL | Status: DC | PRN

## 2016-08-29 MED ORDER — CEFAZOLIN SODIUM-DEXTROSE 2-4 GM/100ML-% IV SOLN
2.0000 g | Freq: Four times a day (QID) | INTRAVENOUS | Status: AC
  Administered 2016-08-29 – 2016-08-30 (×3): 2 g via INTRAVENOUS
  Filled 2016-08-29 (×4): qty 100

## 2016-08-29 MED ORDER — ACETAMINOPHEN 325 MG PO TABS
650.0000 mg | ORAL_TABLET | Freq: Four times a day (QID) | ORAL | Status: DC | PRN
Start: 2016-08-29 — End: 2016-09-01

## 2016-08-29 MED ORDER — ONDANSETRON HCL 4 MG/2ML IJ SOLN
INTRAMUSCULAR | Status: AC
  Filled 2016-08-29: qty 2

## 2016-08-29 MED ORDER — ENOXAPARIN SODIUM 40 MG/0.4ML ~~LOC~~ SOLN
40.0000 mg | SUBCUTANEOUS | Status: DC
  Administered 2016-08-30 – 2016-09-01 (×3): 40 mg via SUBCUTANEOUS
  Filled 2016-08-29 (×3): qty 0.4

## 2016-08-29 MED ORDER — PROPOFOL 10 MG/ML IV BOLUS
INTRAVENOUS | Status: AC
  Filled 2016-08-29: qty 20

## 2016-08-29 MED ORDER — OXYCODONE-ACETAMINOPHEN 5-325 MG PO TABS: 1.0000 | ORAL_TABLET | ORAL | Status: DC | PRN

## 2016-08-29 MED ORDER — MENTHOL 3 MG MT LOZG
1.0000 | LOZENGE | OROMUCOSAL | Status: DC | PRN
  Filled 2016-08-29: qty 9

## 2016-08-29 MED ORDER — SODIUM CHLORIDE 0.9 % IV SOLN
INTRAVENOUS | Status: DC
  Administered 2016-08-29 – 2016-08-30 (×2): via INTRAVENOUS

## 2016-08-29 MED ORDER — MAGNESIUM CITRATE PO SOLN
1.0000 | Freq: Once | ORAL | Status: DC | PRN
  Filled 2016-08-29 (×2): qty 296

## 2016-08-29 MED ORDER — DOCUSATE SODIUM 100 MG PO CAPS
100.0000 mg | ORAL_CAPSULE | Freq: Two times a day (BID) | ORAL | Status: DC
  Administered 2016-08-29 – 2016-09-01 (×6): 100 mg via ORAL
  Filled 2016-08-29 (×5): qty 1

## 2016-08-29 MED ORDER — METHOCARBAMOL 1000 MG/10ML IJ SOLN
500.0000 mg | Freq: Four times a day (QID) | INTRAVENOUS | Status: DC | PRN
  Filled 2016-08-29: qty 5

## 2016-08-29 MED ORDER — MAGNESIUM HYDROXIDE 400 MG/5ML PO SUSP
30.0000 mL | Freq: Every day | ORAL | Status: DC | PRN
  Administered 2016-08-30 (×2): 30 mL via ORAL
  Filled 2016-08-29 (×3): qty 30

## 2016-08-29 MED ORDER — EPHEDRINE SULFATE 50 MG/ML IJ SOLN
INTRAMUSCULAR | Status: AC
  Filled 2016-08-29: qty 1

## 2016-08-29 MED ORDER — ROCURONIUM BROMIDE 50 MG/5ML IV SOLN
INTRAVENOUS | Status: AC
  Filled 2016-08-29: qty 1

## 2016-08-29 MED ORDER — ASPIRIN EC 81 MG PO TBEC
81.0000 mg | DELAYED_RELEASE_TABLET | Freq: Every day | ORAL | Status: DC
  Administered 2016-08-29 – 2016-09-01 (×4): 81 mg via ORAL
  Filled 2016-08-29 (×4): qty 1

## 2016-08-29 MED ORDER — METOCLOPRAMIDE HCL 10 MG PO TABS: 5.0000 mg | ORAL_TABLET | Freq: Three times a day (TID) | ORAL | Status: DC | PRN

## 2016-08-29 MED ORDER — ONDANSETRON HCL 4 MG/2ML IJ SOLN: 4.0000 mg | Freq: Once | INTRAMUSCULAR | Status: DC | PRN

## 2016-08-29 MED ORDER — FENTANYL CITRATE (PF) 100 MCG/2ML IJ SOLN
INTRAMUSCULAR | Status: AC
  Filled 2016-08-29: qty 2

## 2016-08-29 MED ORDER — ACETAMINOPHEN 650 MG RE SUPP: 650.0000 mg | Freq: Four times a day (QID) | RECTAL | Status: DC | PRN

## 2016-08-29 MED ORDER — FENTANYL CITRATE (PF) 100 MCG/2ML IJ SOLN: 25.0000 ug | INTRAMUSCULAR | Status: DC | PRN

## 2016-08-29 MED ORDER — PHENOL 1.4 % MT LIQD
1.0000 | OROMUCOSAL | Status: DC | PRN
  Filled 2016-08-29: qty 177

## 2016-08-29 MED ORDER — SEVOFLURANE IN SOLN
RESPIRATORY_TRACT | Status: AC
  Filled 2016-08-29: qty 250

## 2016-08-29 MED ORDER — DEXAMETHASONE SODIUM PHOSPHATE 10 MG/ML IJ SOLN
INTRAMUSCULAR | Status: AC
  Filled 2016-08-29: qty 1

## 2016-08-29 MED ORDER — MORPHINE SULFATE (PF) 2 MG/ML IV SOLN: 1.0000 mg | INTRAVENOUS | Status: DC | PRN

## 2016-08-29 MED ORDER — VITAMIN D (ERGOCALCIFEROL) 1.25 MG (50000 UNIT) PO CAPS
50000.0000 [IU] | ORAL_CAPSULE | ORAL | Status: DC
  Administered 2016-08-29: 50000 [IU] via ORAL
  Filled 2016-08-29: qty 1

## 2016-08-29 MED ORDER — DEXAMETHASONE SODIUM PHOSPHATE 10 MG/ML IJ SOLN
INTRAMUSCULAR | Status: DC | PRN
  Administered 2016-08-29: 5 mg via INTRAVENOUS

## 2016-08-29 MED ORDER — ALUM & MAG HYDROXIDE-SIMETH 200-200-20 MG/5ML PO SUSP: 30.0000 mL | ORAL | Status: DC | PRN

## 2016-08-29 MED ORDER — METOCLOPRAMIDE HCL 5 MG/ML IJ SOLN: 5.0000 mg | Freq: Three times a day (TID) | INTRAMUSCULAR | Status: DC | PRN

## 2016-08-29 MED ORDER — ZOLPIDEM TARTRATE 5 MG PO TABS: 5.0000 mg | ORAL_TABLET | Freq: Every evening | ORAL | Status: DC | PRN

## 2016-08-29 MED ORDER — METHOCARBAMOL 500 MG PO TABS
500.0000 mg | ORAL_TABLET | Freq: Four times a day (QID) | ORAL | Status: DC | PRN
  Administered 2016-08-30 – 2016-08-31 (×3): 500 mg via ORAL
  Filled 2016-08-29 (×3): qty 1

## 2016-08-29 MED ORDER — OXYCODONE HCL 5 MG PO TABS
5.0000 mg | ORAL_TABLET | ORAL | Status: DC | PRN
  Administered 2016-08-29 – 2016-08-30 (×2): 5 mg via ORAL
  Administered 2016-08-30: 10 mg via ORAL
  Administered 2016-08-30 – 2016-08-31 (×2): 5 mg via ORAL
  Administered 2016-09-01: 10 mg via ORAL
  Filled 2016-08-29: qty 2
  Filled 2016-08-29: qty 1
  Filled 2016-08-29: qty 2
  Filled 2016-08-29 (×3): qty 1

## 2016-08-29 MED ORDER — DOCUSATE SODIUM 100 MG PO CAPS
100.0000 mg | ORAL_CAPSULE | Freq: Two times a day (BID) | ORAL | Status: DC | PRN
  Administered 2016-08-31: 100 mg via ORAL
  Filled 2016-08-29: qty 1

## 2016-08-29 MED ORDER — CEFAZOLIN SODIUM 1 G IJ SOLR
INTRAMUSCULAR | Status: AC
  Filled 2016-08-29: qty 10

## 2016-08-29 SURGICAL SUPPLY — 64 items
BANDAGE ACE 4X5 VEL STRL LF (GAUZE/BANDAGES/DRESSINGS) ×3 IMPLANT
BANDAGE ELASTIC 4 LF NS (GAUZE/BANDAGES/DRESSINGS) ×3 IMPLANT
BIT DRILL 2.5X4 QC (BIT) ×3 IMPLANT
BIT DRILL 4.3MMS DISTAL GRDTED (BIT) ×1 IMPLANT
BNDG ESMARK 4X12 TAN STRL LF (GAUZE/BANDAGES/DRESSINGS) ×3 IMPLANT
CANISTER SUCT 1200ML W/VALVE (MISCELLANEOUS) ×3 IMPLANT
CHLORAPREP W/TINT 26ML (MISCELLANEOUS) ×3 IMPLANT
CUFF TOURN 18 STER (MISCELLANEOUS) ×3 IMPLANT
DRAPE FLUOR MINI C-ARM 54X84 (DRAPES) ×3 IMPLANT
DRAPE SHEET LG 3/4 BI-LAMINATE (DRAPES) ×3 IMPLANT
DRAPE SURG 17X11 SM STRL (DRAPES) ×3 IMPLANT
DRAPE U-SHAPE 47X51 STRL (DRAPES) ×3 IMPLANT
DRILL 4.3MMS DISTAL GRADUATED (BIT) ×3
DRSG OPSITE POSTOP 3X4 (GAUZE/BANDAGES/DRESSINGS) ×9 IMPLANT
ELECT CAUTERY BLADE 6.4 (BLADE) ×3 IMPLANT
ELECT REM PT RETURN 9FT ADLT (ELECTROSURGICAL) ×3
ELECTRODE REM PT RTRN 9FT ADLT (ELECTROSURGICAL) ×1 IMPLANT
GAUZE PETRO XEROFOAM 1X8 (MISCELLANEOUS) ×6 IMPLANT
GAUZE SPONGE 4X4 12PLY STRL (GAUZE/BANDAGES/DRESSINGS) ×3 IMPLANT
GLOVE BIOGEL PI IND STRL 9 (GLOVE) ×1 IMPLANT
GLOVE BIOGEL PI INDICATOR 9 (GLOVE) ×2
GLOVE SURG SYN 9.0  PF PI (GLOVE) ×4
GLOVE SURG SYN 9.0 PF PI (GLOVE) ×2 IMPLANT
GOWN SRG 2XL LVL 4 RGLN SLV (GOWNS) ×1 IMPLANT
GOWN STRL NON-REIN 2XL LVL4 (GOWNS) ×2
GOWN STRL REUS W/ TWL LRG LVL3 (GOWN DISPOSABLE) ×1 IMPLANT
GOWN STRL REUS W/TWL LRG LVL3 (GOWN DISPOSABLE) ×2
GUIDEPIN VERSANAIL DSP 3.2X444 ×3 IMPLANT
GUIDEWIRE BALL NOSE 80CM (WIRE) ×3 IMPLANT
HFN RH 130 DEG 9MM X 360MM (Nail) ×3 IMPLANT
IV NS 500ML (IV SOLUTION) ×2
IV NS 500ML BAXH (IV SOLUTION) ×1 IMPLANT
K-WIRE 1.6 (WIRE) ×2
K-WIRE FX5X1.6XNS BN SS (WIRE) ×1
KIT RM TURNOVER STRD PROC AR (KITS) ×3 IMPLANT
KWIRE FX5X1.6XNS BN SS (WIRE) ×1 IMPLANT
MAT BLUE FLOOR 46X72 FLO (MISCELLANEOUS) ×3 IMPLANT
NEEDLE FILTER BLUNT 18X 1/2SAF (NEEDLE) ×2
NEEDLE FILTER BLUNT 18X1 1/2 (NEEDLE) ×1 IMPLANT
NS IRRIG 500ML POUR BTL (IV SOLUTION) ×3 IMPLANT
PACK EXTREMITY ARMC (MISCELLANEOUS) ×3 IMPLANT
PACK HIP COMPR (MISCELLANEOUS) ×3 IMPLANT
PAD CAST CTTN 4X4 STRL (SOFTGOODS) ×1 IMPLANT
PADDING CAST COTTON 4X4 STRL (SOFTGOODS) ×2
PEG SUBCHONDRAL SMOOTH 2.0X14 (Peg) ×3 IMPLANT
PEG SUBCHONDRAL SMOOTH 2.0X16 (Peg) ×3 IMPLANT
PEG SUBCHONDRAL SMOOTH 2.0X20 (Peg) ×6 IMPLANT
PEG SUBCHONDRAL SMOOTH 2.0X22 (Peg) ×6 IMPLANT
PLATE SHORT 21.6X48.9 NRRW RT (Plate) ×3 IMPLANT
SCREW BONE CORTICAL 5.0X50 (Screw) ×3 IMPLANT
SCREW CORT 3.5X10 LNG (Screw) ×9 IMPLANT
SCREW LAG 10.5MMX105MM HFN (Screw) ×3 IMPLANT
SPLINT CAST 1 STEP 3X12 (MISCELLANEOUS) ×3 IMPLANT
STAPLER SKIN PROX 35W (STAPLE) ×3 IMPLANT
STOCKINETTE STRL 4IN 9604848 (GAUZE/BANDAGES/DRESSINGS) ×3 IMPLANT
SUT ETHILON 4-0 (SUTURE) ×2
SUT ETHILON 4-0 FS2 18XMFL BLK (SUTURE) ×1
SUT VIC AB 1 CT1 36 (SUTURE) ×3 IMPLANT
SUT VIC AB 2-0 CT1 (SUTURE) ×3 IMPLANT
SUT VICRYL 3-0 27IN (SUTURE) ×3 IMPLANT
SUTURE ETHLN 4-0 FS2 18XMF BLK (SUTURE) ×1 IMPLANT
SYR 3ML LL SCALE MARK (SYRINGE) ×3 IMPLANT
SYRINGE 10CC LL (SYRINGE) ×3 IMPLANT
TAPE MICROFOAM 4IN (TAPE) ×3 IMPLANT

## 2016-08-29 NOTE — ED Triage Notes (Signed)
Fell in yard while picking up trash cans just prior to coming in.  Says she laid there for about 20 minutes.  She is wet from rain and skin feels cold .  She is alert and oriented.

## 2016-08-29 NOTE — Op Note (Signed)
08/29/2016  6:16 PM  PATIENT:  Tanya Roy  83 y.o. female  PRE-OPERATIVE DIAGNOSIS:  Right hip fracture, and right wrist fracture intertrochanteric right hip fracture, extra-articular right distal radius fracture  POST-OPERATIVE DIAGNOSIS:  Right hip fracture, and right wrist fracture  PROCEDURE:  Procedure(s): INTRAMEDULLARY (IM) NAIL FEMORAL (Right) OPEN REDUCTION INTERNAL FIXATION (ORIF) WRIST FRACTURE (Right)  SURGEON: Laurene Footman, MD  ASSISTANTS: None  ANESTHESIA:   general  EBL:  Total I/O In: 800 [I.V.:800] Out: 400 [Urine:250; Blood:150]  BLOOD ADMINISTERED:none  DRAINS: none   LOCAL MEDICATIONS USED:  NONE  SPECIMEN:  No Specimen  DISPOSITION OF SPECIMEN:  N/A  COUNTS:  YES  TOURNIQUET:   22 minutes at 250 mmHg for right upper extremity  IMPLANTS: Biomet affixes 9 x 3 60 right rod with 105 mm lag screw and 50 mm interlocking screw, short narrow right DVR plate with multiple pegs and screws  DICTATION: .Dragon Dictation patient was brought the operating room and after adequate anesthesia was obtained the right foot was placed in the traction boot with gentle traction applied left leg in the well-leg holder on the fracture table C-arm was brought in and good reduction was obtained with this maneuver. The hip was then prepped and draped using a Barrier drape method and after appropriate patient identification and timeout procedures, a small proximal incision was made and a guide were inserted into the tip the trochanter. Proximal reaming was carried out followed by placement of a long guidewire and reaming at 11 mm gave chatter in the mid shaft. A 9 x 3 60 rod was then obtained and inserted down the canal to the appropriate depth. A small lateral incision was made and the guidewires inserted into the center center position into the head measurements made off of this were 105 and it was drilled to 105 followed by the placement of 105 mm lag screw. At this point  traction was released and compression applied. The locking screw proximally was then tightened and a quarter turn loosening to allow for further compression. Insertion handle was removed and AP lateral imaging saved. Going distally with appropriate laterals obtained of the distal screw holes through the slotted hole a small Sigel drill holes made through a small incision followed by measuring and placing the 5.0 cortical screw for distal interlocking of the wounds were then thoroughly irrigated the deep fascia repaired with #1 Vicryl 2-0 Vicryl subcutaneously and skin staples Xeroform and honeycomb dressings applied. The left leg was taken out of the well-leg holder and the right foot out of traction with post removed and the patient repositioned for ORIF of the wrist. A tourniquet was applied to the upper arm and the arm prepped draped in sterile fashion with repeat timeout procedure being carried out. Tourniquet was raised to fingertrap traction applied to the index and middle fingers. Incision was carried out over the FCR tendon sheath and after incising the tendon sheath the tendon was retracted radially protect the radial artery and vessels. The pronator was then elevated and the fracture reduced with use of a freer elevator. With the fracture in near anatomic alignment a short narrow DVR plate was applied and 3 cortical screws placed. With the wrist held in a flexed position the distal peg holes were filled drilling measuring and placing the smooth pegs this gave essentially anatomic alignment to the distal radius without penetration into the joint. After all peg holes were filled the tourniquet was let down and the wound irrigated  the wound closed with 3-0 Vicryl subcutaneously and 4-0 nylon Xeroform 4 x 4's web roll and a ulnar gutter splint were then applied followed by an Ace wrap  PLAN OF CARE: Continue as inpatient  PATIENT DISPOSITION:  PACU - hemodynamically stable.

## 2016-08-29 NOTE — Anesthesia Preprocedure Evaluation (Signed)
Anesthesia Evaluation  Patient identified by MRN, date of birth, ID band Patient awake    Reviewed: Allergy & Precautions, H&P , NPO status , Patient's Chart, lab work & pertinent test results, reviewed documented beta blocker date and time   Airway Mallampati: II  TM Distance: >3 FB Neck ROM: full    Dental  (+) Teeth Intact   Pulmonary neg pulmonary ROS,    Pulmonary exam normal        Cardiovascular negative cardio ROS Normal cardiovascular exam Rhythm:regular Rate:Normal     Neuro/Psych negative neurological ROS  negative psych ROS   GI/Hepatic negative GI ROS, Neg liver ROS,   Endo/Other  negative endocrine ROS  Renal/GU negative Renal ROS  negative genitourinary   Musculoskeletal   Abdominal   Peds  Hematology negative hematology ROS (+)   Anesthesia Other Findings Past Medical History: 2012: Abnormal mammogram of right breast     Comment: Microcalcifications No date: Arthritis No date: Osteoporosis Past Surgical History: No date: APPENDECTOMY 2013: BREAST BIOPSY Right     Comment: stereo, neg 03/26/2015: MELANOMA EXCISION Right     Comment: Procedure: MELANOMA EXCISION;  Surgeon:               Clayburn Pert, MD;  Location: [facility] ORS;                Service: General;  Laterality: Right; 03/11/2015: SKIN BIOPSY     Comment: Back BMI    Body Mass Index:  22.60 kg/m     Reproductive/Obstetrics negative OB ROS                             Anesthesia Physical Anesthesia Plan  ASA: II and emergent  Anesthesia Plan: General ETT   Post-op Pain Management:    Induction:   Airway Management Planned:   Additional Equipment:   Intra-op Plan:   Post-operative Plan:   Informed Consent: I have reviewed the patients History and Physical, chart, labs and discussed the procedure including the risks, benefits and alternatives for the proposed anesthesia with the patient or  authorized representative who has indicated his/her understanding and acceptance.   Dental Advisory Given  Plan Discussed with: CRNA  Anesthesia Plan Comments:         Anesthesia Quick Evaluation

## 2016-08-29 NOTE — ED Provider Notes (Signed)
[facility] Emergency Department Provider Note  ____________________________________________  Time seen: Approximately 12:02 PM  I have reviewed the triage vital signs and the nursing notes.   HISTORY  Chief Complaint Fall; Hip Pain; and Wrist Pain   HPI AFTIN LA is a 83 y.o. female the history of osteoporosis who presents for evaluation of right wrist and hip pain status post mechanical fall. Patient reports that she was moving her trashcan when she slipped and fell  As it is wet outside due to rain. She was unable to stand up due to pain in her right hip. Patient does not take any blood thinners. No prior hip fractures. She was on the floor for 20 minutes until her neighbor saw her and called the ambulance. Patient is complaining of pain in her right wrist and right hip worse with movement and palpation that has been constant since her fall. Pain is mild at rest. Patient does not remember if she hit her head. She denies LOC. Patient denies headache, chest pain, palpitations, shortness of breath, dizziness, back pain, abdominal pain both preceding or after the fall.  Past Medical History:  Diagnosis Date  . Abnormal mammogram of right breast 2012   Microcalcifications  . Arthritis   . Osteoporosis     Patient Active Problem List   Diagnosis Date Noted  . Melanoma of skin ([place]) 03/24/2015  . OP (osteoporosis) 03/04/2015  . Arthritis, degenerative 02/24/2014    Past Surgical History:  Procedure Laterality Date  . APPENDECTOMY    . BREAST BIOPSY Right 2013   stereo, neg  . MELANOMA EXCISION Right 03/26/2015   Procedure: MELANOMA EXCISION;  Surgeon: Clayburn Pert, MD;  Location: [facility] ORS;  Service: General;  Laterality: Right;  . SKIN BIOPSY  03/11/2015   Back    Prior to Admission medications   Medication Sig Start Date End Date Taking? Authorizing Provider  aspirin 325 MG EC tablet Take 325 mg by mouth daily as needed for pain.   Yes  Historical Provider, MD  Vitamin D, Ergocalciferol, (DRISDOL) 50000 units CAPS capsule Take 50,000 Units by mouth every 7 (seven) days.   Yes Historical Provider, MD  aspirin EC 81 MG tablet Take 81 mg by mouth daily.    Historical Provider, MD  Multiple Vitamin (MULTIVITAMIN) tablet Take 1 tablet by mouth daily.    Historical Provider, MD    Allergies Patient has no known allergies.  Family History  Problem Relation Age of Onset  . Tuberculosis Mother   . Heart disease Father   . Breast cancer Neg Hx     Social History Social History  Substance Use Topics  . Smoking status: Never Smoker  . Smokeless tobacco: Never Used  . Alcohol use No    Review of Systems Constitutional: Negative for fever. Eyes: Negative for visual changes. ENT: Negative for facial injury or neck injury Cardiovascular: Negative for chest injury. Respiratory: Negative for shortness of breath. Negative for chest wall injury. Gastrointestinal: Negative for abdominal pain or injury. Genitourinary: Negative for dysuria. Musculoskeletal: Negative for back injury. + R wrist and R hip pain Skin: Negative for laceration/abrasions. Neurological: Negative for head injury.   ____________________________________________   PHYSICAL EXAM:  VITAL SIGNS: ED Triage Vitals [08/29/16 1140]  Enc Vitals Group     BP (!) 193/83     Pulse Rate 74     Resp 16     Temp 97.4 F (36.3 C)     Temp Source Oral  SpO2 96 %     Weight 140 lb (63.5 kg)     Height 5\' 6"  (1.676 m)     Head Circumference      Peak Flow      Pain Score      Pain Loc      Pain Edu?      Excl. in [place]?    Full spinal precautions maintained throughout the trauma exam. Constitutional: Alert and oriented. No acute distress. Does not appear intoxicated. HEENT Head: Normocephalic and atraumatic. Face: No facial bony tenderness. Stable midface Ears: No hemotympanum bilaterally. No Battle sign Eyes: No eye injury. PERRL. No raccoon  eyes Nose: Nontender. No epistaxis. No rhinorrhea Mouth/Throat: Mucous membranes are moist. No oropharyngeal blood. No dental injury. Airway patent without stridor. Normal voice. Neck: no C-collar in place. No midline c-spine tenderness.  Cardiovascular: Normal rate, regular rhythm. Normal and symmetric distal pulses are present in all extremities. Pulmonary/Chest: Chest wall is stable and nontender to palpation/compression. Normal respiratory effort. Breath sounds are normal. No crepitus.  Abdominal: Soft, nontender, non distended. Musculoskeletal: Obvious deformity and tenderness to palpation of the right wrist. Nontender with full range of motion of right elbow and right shoulder. Patient is tender to palpation on the proximal lateral right femur with no obvious deformity. Nontender with normal full range of motion in left extremities. No thoracic or lumbar midline spinal tenderness. Pelvis is stable. Skin: Skin is warm, dry and intact. No abrasions or contutions. Psychiatric: Speech and behavior are appropriate. Neurological: Normal speech and language. Moves all extremities to command. No gross focal neurologic deficits are appreciated.  Glascow Coma Score: 4 - Opens eyes on own 6 - Follows simple motor commands 5 - Alert and oriented GCS: 15   ____________________________________________   LABS (all labs ordered are listed, but only abnormal results are displayed)  Labs Reviewed  BASIC METABOLIC PANEL - Abnormal; Notable for the following:       Result Value   Glucose, Bld 112 (*)    BUN 22 (*)    Calcium 8.7 (*)    GFR calc non Af Amer 55 (*)    All other components within normal limits  CBC  PROTIME-INR  APTT  TYPE AND SCREEN  TYPE AND SCREEN   ____________________________________________  EKG  none  ____________________________________________  RADIOLOGY  CT head and c-spine: Negative  XR R wrist: 1. Nondisplaced fracture of the distal radial metaphysis. Mild  apex volar angulation with minimal dorsal displacement. 2. Nondisplaced fracture of the distal ulnar metaphysis.   XR hip: 1. Acute and comminuted intertrochanteric fracture affects the proximal right femur. ____________________________________________   PROCEDURES  Procedure(s) performed: None Procedures Critical Care performed:  None ____________________________________________   INITIAL IMPRESSION / ASSESSMENT AND PLAN / ED COURSE  83 y.o. female the history of osteoporosis who presents for evaluation of right wrist and hip pain status post mechanical fall. She has an obvious deformity and tenderness to palpation of her right wrist and also tenderness to palpation of the right proximal femur. We'll pursue with x-rays and CT head and cervical spine. No other injuries seen on exam and history. Patient will given IM fentanyl for the pain.  Clinical Course as of Aug 30 1403  Tue Aug 29, 2016  1402 X-ray concerning for distal radius fracture and right hip fracture. Discussed with Dr. Rudene Christians who will admit patient to his service. Patient and her family have been updated. Surgical labs have been done. Foley catheter has been placed.  [  CV]    Clinical Course User Index [CV] Rudene Re, MD    Pertinent labs & imaging results that were available during my care of the patient were reviewed by me and considered in my medical decision making (see chart for details).    ____________________________________________   FINAL CLINICAL IMPRESSION(S) / ED DIAGNOSES  Final diagnoses:  Fall, initial encounter  Closed fracture of right hip, initial encounter ([place])  Closed fracture of distal end of right radius, unspecified fracture morphology, initial encounter      NEW MEDICATIONS STARTED DURING THIS VISIT:  New Prescriptions   No medications on file     Note:  This document was prepared using Dragon voice recognition software and may include unintentional dictation  errors.    Rudene Re, MD 08/29/16 [id number]

## 2016-08-29 NOTE — ED Notes (Signed)
Patient has obvious deformity to right wrist and obvious shortening and rotation to right leg.

## 2016-08-29 NOTE — Consult Note (Signed)
Active 83 year old suffered a fall in her home earlier today. Denies loss of consciousness. 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 She is known to have osteoporosis. She fell onto her right side and fracturing her right hip and distal radius  On exam her right leg is slightly shortened and actually rotated with poor pulses with skin intact. She is able flex extend the toes. Skin around the hip is normal  On right wrist exam she has a silver-fork deformity with intact circumflex radial artery pulse and sensation.   Radiographs show comminuted distal radius fracture significant dorsal displacement, right intertrochanteric fracture with varus deformity and apparent osteopenia without osteoarthritis  Impression is right intertrochanteric hip fracture and right distal radius fracture  Plan is for ORIF later today since she has been nothing by mouth,  both wrist and hip with IM nail for hip and volar plate for wrist. Risks benefits possible complications discussed

## 2016-08-29 NOTE — Anesthesia Postprocedure Evaluation (Signed)
Anesthesia Post Note  Patient: GLORY WANN  Procedure(s) Performed: Procedure(s) (LRB): INTRAMEDULLARY (IM) NAIL FEMORAL (Right) OPEN REDUCTION INTERNAL FIXATION (ORIF) WRIST FRACTURE (Right)  Patient location during evaluation: PACU Anesthesia Type: General Level of consciousness: awake and alert Pain management: pain level controlled Vital Signs Assessment: post-procedure vital signs reviewed and stable Respiratory status: spontaneous breathing, nonlabored ventilation, respiratory function stable and patient connected to nasal cannula oxygen Cardiovascular status: blood pressure returned to baseline and stable Postop Assessment: no signs of nausea or vomiting Anesthetic complications: no     Last Vitals:  Vitals:   08/29/16 1820 08/29/16 1830  BP: 122/63   Pulse: 74 87  Resp: (!) 21 14  Temp: 36.8 C     Last Pain:  Vitals:   08/29/16 1820  TempSrc:   PainSc: Asleep                 Molli Barrows

## 2016-08-29 NOTE — Anesthesia Post-op Follow-up Note (Cosign Needed)
Anesthesia QCDR form completed.        

## 2016-08-29 NOTE — Transfer of Care (Signed)
Immediate Anesthesia Transfer of Care Note  Patient: Tanya Roy  Procedure(s) Performed: Procedure(s): INTRAMEDULLARY (IM) NAIL FEMORAL (Right) OPEN REDUCTION INTERNAL FIXATION (ORIF) WRIST FRACTURE (Right)  Patient Location: PACU  Anesthesia Type:General  Level of Consciousness: sedated and patient cooperative  Airway & Oxygen Therapy: Patient Spontanous Breathing and Patient connected to face mask oxygen  Post-op Assessment: Report given to RN and Post -op Vital signs reviewed and stable  Post vital signs: Reviewed and stable  Last Vitals:  Vitals:   08/29/16 1430 08/29/16 1500  BP: (!) 165/87 (!) 170/79  Pulse: 74 75  Resp: 16 20  Temp:      Last Pain:  Vitals:   08/29/16 1140  TempSrc: Oral         Complications: No apparent anesthesia complications

## 2016-08-29 NOTE — H&P (Signed)
[place] [facility] [place] NAME: Tanya Roy    MR#:  [id number]  DATE OF BIRTH:  [birth year]  DATE OF ADMISSION:  08/29/2016  PRIMARY CARE PHYSICIAN: BABAOFF, Caryl Bis, MD   REQUESTING/REFERRING PHYSICIAN: Alfred Levins  CHIEF COMPLAINT:   Chief Complaint  Patient presents with  . Fall  . Hip Pain  . Wrist Pain    HISTORY OF PRESENT ILLNESS: Tanya Roy  is a 83 y.o. female with a known history of Osteoporosis, arthritis-lives at home alone and walks without any support unable to take care of herself. She walks around the house and when she go for the grocery shopping's and she denies any associated shortness of breath or chest pain while doing that. Today while taking her trash can out because it was a rainy day she slept on her driveway and fell down with injuries to her right wrist and hip and so came to emergency room, noted to have fracture on her right wrist and her right hip. Orthopedics have seen her and planning to take her ORIF for later today.  PAST MEDICAL HISTORY:   Past Medical History:  Diagnosis Date  . Abnormal mammogram of right breast 2012   Microcalcifications  . Arthritis   . Osteoporosis     PAST SURGICAL HISTORY: Past Surgical History:  Procedure Laterality Date  . APPENDECTOMY    . BREAST BIOPSY Right 2013   stereo, neg  . MELANOMA EXCISION Right 03/26/2015   Procedure: MELANOMA EXCISION;  Surgeon: Clayburn Pert, MD;  Location: [facility] ORS;  Service: General;  Laterality: Right;  . SKIN BIOPSY  03/11/2015   Back    SOCIAL HISTORY:  Social History  Substance Use Topics  . Smoking status: Never Smoker  . Smokeless tobacco: Never Used  . Alcohol use No    FAMILY HISTORY:  Family History  Problem Relation Age of Onset  . Tuberculosis Mother   . Heart disease Father   . Breast cancer Neg Hx     DRUG ALLERGIES: No Known Allergies  REVIEW OF SYSTEMS:   CONSTITUTIONAL: No fever, fatigue or weakness.   EYES: No blurred or double vision.  EARS, NOSE, AND THROAT: No tinnitus or ear pain.  RESPIRATORY: No cough, shortness of breath, wheezing or hemoptysis.  CARDIOVASCULAR: No chest pain, orthopnea, edema.  GASTROINTESTINAL: No nausea, vomiting, diarrhea or abdominal pain.  GENITOURINARY: No dysuria, hematuria.  ENDOCRINE: No polyuria, nocturia,  HEMATOLOGY: No anemia, easy bruising or bleeding SKIN: No rash or lesion. MUSCULOSKELETAL: Pain in right wrist and hip joints. NEUROLOGIC: No tingling, numbness, weakness.  PSYCHIATRY: No anxiety or depression.   MEDICATIONS AT HOME:  Prior to Admission medications   Medication Sig Start Date End Date Taking? Authorizing Provider  aspirin 325 MG EC tablet Take 325 mg by mouth daily as needed for pain.   Yes Historical Provider, MD  Vitamin D, Ergocalciferol, (DRISDOL) 50000 units CAPS capsule Take 50,000 Units by mouth every 7 (seven) days.   Yes Historical Provider, MD  aspirin EC 81 MG tablet Take 81 mg by mouth daily.    Historical Provider, MD  Multiple Vitamin (MULTIVITAMIN) tablet Take 1 tablet by mouth daily.    Historical Provider, MD      PHYSICAL EXAMINATION:   VITAL SIGNS: Blood pressure (!) 170/79, pulse 75, temperature 97.4 F (36.3 C), temperature source Oral, resp. rate 20, height 5\' 6"  (1.676 m), weight 63.5 kg (140 lb), SpO2 98 %.  GENERAL:  83 y.o.-year-old patient  lying in the bed with no acute distress.  EYES: Pupils equal, round, reactive to light and accommodation. No scleral icterus. Extraocular muscles intact.  HEENT: Head atraumatic, normocephalic. Oropharynx and nasopharynx clear.  NECK:  Supple, no jugular venous distention. No thyroid enlargement, no tenderness.  LUNGS: Normal breath sounds bilaterally, no wheezing, rales,rhonchi or crepitation. No use of accessory muscles of respiration.  CARDIOVASCULAR: S1, S2 normal. No murmurs, rubs, or gallops.  ABDOMEN: Soft, nontender, nondistended. Bowel sounds present.  No organomegaly or mass.  EXTREMITIES: No pedal edema, cyanosis, or clubbing. He nebulizer dressing present in right wrist and tender right hip on movement. NEUROLOGIC: Cranial nerves II through XII are intact. Muscle strength 5/5 in all extremities except right lower extremity where she has pain. Sensation intact. Gait not checked.  PSYCHIATRIC: The patient is alert and oriented x 3.  SKIN: No obvious rash, lesion, or ulcer.   LABORATORY PANEL:   CBC  Recent Labs Lab 08/29/16 1228  WBC 7.1  HGB 13.9  HCT 40.3  PLT 178  MCV 95.7  MCH 33.1  MCHC 34.6  RDW 13.2   ------------------------------------------------------------------------------------------------------------------  Chemistries   Recent Labs Lab 08/29/16 1228  NA 136  K 3.9  CL 105  CO2 24  GLUCOSE 112*  BUN 22*  CREATININE 0.92  CALCIUM 8.7*   ------------------------------------------------------------------------------------------------------------------ estimated creatinine clearance is 41.9 mL/min (by C-G formula based on SCr of 0.92 mg/dL). ------------------------------------------------------------------------------------------------------------------ No results for input(s): TSH, T4TOTAL, T3FREE, THYROIDAB in the last 72 hours.  Invalid input(s): FREET3   Coagulation profile  Recent Labs Lab 08/29/16 1228  INR 1.01   ------------------------------------------------------------------------------------------------------------------- No results for input(s): DDIMER in the last 72 hours. -------------------------------------------------------------------------------------------------------------------  Cardiac Enzymes No results for input(s): CKMB, TROPONINI, MYOGLOBIN in the last 168 hours.  Invalid input(s): CK ------------------------------------------------------------------------------------------------------------------ Invalid input(s):  POCBNP  ---------------------------------------------------------------------------------------------------------------  Urinalysis No results found for: COLORURINE, APPEARANCEUR, LABSPEC, PHURINE, GLUCOSEU, HGBUR, BILIRUBINUR, KETONESUR, PROTEINUR, UROBILINOGEN, NITRITE, LEUKOCYTESUR   RADIOLOGY: Dg Chest 1 View  Result Date: 08/29/2016 CLINICAL DATA:  Post fall, fell in yard while picking the trash can EXAM: CHEST 1 VIEW COMPARISON:  None. FINDINGS: Cardiomediastinal silhouette is unremarkable. Mild thoracic dextroscoliosis. Diffuse osteopenia. Probable chronic mild interstitial prominence bilaterally. No infiltrate or pulmonary edema. IMPRESSION: No active disease. Mild hyperinflation. Probable chronic mild interstitial prominence. Mild thoracic dextroscoliosis. Diffuse osteopenia. Electronically Signed   By: Lahoma Crocker M.D.   On: 08/29/2016 13:28   Dg Wrist Complete Right  Result Date: 08/29/2016 CLINICAL DATA:  Status post fall. EXAM: RIGHT WRIST - COMPLETE 3+ VIEW COMPARISON:  None. FINDINGS: Nondisplaced fracture of the distal radial metaphysis. Mild apex volar angulation with minimal dorsal displacement. Nondisplaced fracture of the distal ulnar metaphysis. No other fracture or dislocation. Generalized osteopenia. Soft tissue swelling along the volar aspect of the right wrist. IMPRESSION: 1. Nondisplaced fracture of the distal radial metaphysis. Mild apex volar angulation with minimal dorsal displacement. 2. Nondisplaced fracture of the distal ulnar metaphysis. Electronically Signed   By: Kathreen Devoid   On: 08/29/2016 13:22   Ct Head Wo Contrast  Result Date: 08/29/2016 CLINICAL DATA:  Fall in yard today.  Initial encounter. EXAM: CT HEAD WITHOUT CONTRAST CT CERVICAL SPINE WITHOUT CONTRAST TECHNIQUE: Multidetector CT imaging of the head and cervical spine was performed following the standard protocol without intravenous contrast. Multiplanar CT image reconstructions of the cervical spine were  also generated. COMPARISON:  None. FINDINGS: CT HEAD FINDINGS Brain: No evidence of acute infarction, hemorrhage, hydrocephalus, extra-axial collection or mass  lesion/mass effect. Vascular: No hyperdense vessel or unexpected calcification. Skull: Negative for fracture. Lucency in the high left parietal bone is likely hemangioma. Sinuses/Orbits: No acute finding. CT CERVICAL SPINE FINDINGS Alignment: No traumatic malalignment. Skull base and vertebrae: Negative for fracture Soft tissues and spinal canal: No prevertebral fluid or swelling. No visible canal hematoma. Disc levels: Diffuse cervical spine degeneration. Multilevel facet spurring. C3-4 mild to moderate spinal stenosis. Upper chest: Negative IMPRESSION: No evidence of acute intracranial or cervical spine injury. Electronically Signed   By: Monte Fantasia M.D.   On: 08/29/2016 12:56   Ct Cervical Spine Wo Contrast  Result Date: 08/29/2016 CLINICAL DATA:  Fall in yard today.  Initial encounter. EXAM: CT HEAD WITHOUT CONTRAST CT CERVICAL SPINE WITHOUT CONTRAST TECHNIQUE: Multidetector CT imaging of the head and cervical spine was performed following the standard protocol without intravenous contrast. Multiplanar CT image reconstructions of the cervical spine were also generated. COMPARISON:  None. FINDINGS: CT HEAD FINDINGS Brain: No evidence of acute infarction, hemorrhage, hydrocephalus, extra-axial collection or mass lesion/mass effect. Vascular: No hyperdense vessel or unexpected calcification. Skull: Negative for fracture. Lucency in the high left parietal bone is likely hemangioma. Sinuses/Orbits: No acute finding. CT CERVICAL SPINE FINDINGS Alignment: No traumatic malalignment. Skull base and vertebrae: Negative for fracture Soft tissues and spinal canal: No prevertebral fluid or swelling. No visible canal hematoma. Disc levels: Diffuse cervical spine degeneration. Multilevel facet spurring. C3-4 mild to moderate spinal stenosis. Upper chest:  Negative IMPRESSION: No evidence of acute intracranial or cervical spine injury. Electronically Signed   By: Monte Fantasia M.D.   On: 08/29/2016 12:56   Dg Hip Unilat W Or Wo Pelvis 2-3 Views Right  Result Date: 08/29/2016 CLINICAL DATA:  Fall. EXAM: DG HIP (WITH OR WITHOUT PELVIS) 2-3V RIGHT COMPARISON:  None. FINDINGS: There is an acute and comminuted intertrochanteric fracture involving the proximal right femur. Medial angulation of the distal fracture fragments noted. Chronic fracture deformity involving the left pubic bone identified. IMPRESSION: 1. Acute and comminuted intertrochanteric fracture affects the proximal right femur. Electronically Signed   By: Kerby Moors M.D.   On: 08/29/2016 13:25    EKG: Orders placed or performed during the hospital encounter of 03/25/15  . EKG test  . EKG test    IMPRESSION AND PLAN:  * Right hip fracture   Patient does not have any other medical issues and she has very good functional status without any chest pain or shortness of breath at her baseline.   She is medically stable with low cardiovascular risk for hip surgery.   Pain management and DVT prophylaxis as per orthopedic surgery.  * Right wrist fracture   Management as per orthopedic surgery.  * Osteoporosis   Continue calcium and vitamin D.  * Hypertension   This is secondary to pain, continue monitoring.  All the records are reviewed and case discussed with ED provider. Management plans discussed with the patient, family and they are in agreement.  CODE STATUS: Full code Code Status History    This patient does not have a recorded code status. Please follow your organizational policy for patients in this situation.     Multiple family members were present in the room.  TOTAL TIME TAKING CARE OF THIS PATIENT: 45 minutes.    Vaughan Basta M.D on 08/29/2016   Between 7am to 6pm - Pager - [phone]  After 6pm go to [email or web address] - password [email or web address] [facility]  Office  [phone]  CC:  Primary care physician; BABAOFF, Caryl Bis, MD   Note: This dictation was prepared with Dragon dictation along with smaller phrase technology. Any transcriptional errors that result from this process are unintentional.

## 2016-08-30 ENCOUNTER — Encounter: Payer: Self-pay | Admitting: Orthopedic Surgery

## 2016-08-30 LAB — CBC
HCT: 34.4 % — ABNORMAL LOW (ref 35.0–47.0)
Hemoglobin: 11.6 g/dL — ABNORMAL LOW (ref 12.0–16.0)
MCH: 32.4 pg (ref 26.0–34.0)
MCHC: 33.7 g/dL (ref 32.0–36.0)
MCV: 96.2 fL (ref 80.0–100.0)
PLATELETS: 181 10*3/uL (ref 150–440)
RBC: 3.58 MIL/uL — AB (ref 3.80–5.20)
RDW: 13 % (ref 11.5–14.5)
WBC: 12.5 10*3/uL — AB (ref 3.6–11.0)

## 2016-08-30 LAB — BASIC METABOLIC PANEL
Anion gap: 8 (ref 5–15)
BUN: 22 mg/dL — ABNORMAL HIGH (ref 6–20)
CALCIUM: 8.1 mg/dL — AB (ref 8.9–10.3)
CO2: 23 mmol/L (ref 22–32)
CREATININE: 0.84 mg/dL (ref 0.44–1.00)
Chloride: 106 mmol/L (ref 101–111)
GFR calc non Af Amer: >60 mL/min (ref >60)
Glucose, Bld: 170 mg/dL — ABNORMAL HIGH (ref 65–99)
Potassium: 4.4 mmol/L (ref 3.5–5.1)
SODIUM: 137 mmol/L (ref 135–145)

## 2016-08-30 NOTE — NC FL2 (Signed)
[place] LEVEL OF CARE SCREENING TOOL     IDENTIFICATION  Patient Name: Tanya Roy Birthdate: 1929/02/20 Sex: female Admission Date (Current Location): 08/29/2016  [place] and [place] Number:  Engineering geologist and Address:  [facility], [address], [place], [place] [address]      Provider Number: [id number]  Attending Physician Name and Address:  Epifanio Lesches, MD  Relative Name and Phone Number:       Current Level of Care: Hospital Recommended Level of Care: [place] Prior Approval Number:    Date Approved/Denied:   PASRR Number:  [id number])  Discharge Plan: SNF    Current Diagnoses: Patient Active Problem List   Diagnosis Date Noted  . Hip fracture ([place]) 08/29/2016  . Melanoma of skin ([place]) 03/24/2015  . OP (osteoporosis) 03/04/2015  . Arthritis, degenerative 02/24/2014    Orientation RESPIRATION BLADDER Height & Weight     Self, Time, Situation, Place  Normal Continent Weight: 140 lb (63.5 kg) Height:  5\' 6"  (167.6 cm)  BEHAVIORAL SYMPTOMS/MOOD NEUROLOGICAL BOWEL NUTRITION STATUS   (none)  (none) Continent Diet (Regular Diet )  AMBULATORY STATUS COMMUNICATION OF NEEDS Skin   Extensive Assist Verbally Surgical wounds (Incision: right hip and right wrist. )                       Personal Care Assistance Level of Assistance  Bathing, Feeding, Dressing Bathing Assistance: Limited assistance Feeding assistance: Independent Dressing Assistance: Limited assistance     Functional Limitations Info  Sight, Hearing, Speech Sight Info: Adequate Hearing Info: Adequate Speech Info: Adequate    SPECIAL CARE FACTORS FREQUENCY  PT (By licensed PT), OT (By licensed OT)     PT Frequency:  (5) OT Frequency:  (5)            Contractures      Additional Factors Info  Code Status, Allergies Code Status Info:  (Full Code. ) Allergies Info:  (No Known Allergies. )            Current Medications (08/30/2016):  This is the current hospital active medication list Current Facility-Administered Medications  Medication Dose Route Frequency Provider Last Rate Last Dose  . acetaminophen (TYLENOL) tablet 650 mg  650 mg Oral Q6H PRN Hessie Knows, MD       Or  . acetaminophen (TYLENOL) suppository 650 mg  650 mg Rectal Q6H PRN Hessie Knows, MD      . alum & mag hydroxide-simeth (MAALOX/MYLANTA) 200-200-20 MG/5ML suspension 30 mL  30 mL Oral Q4H PRN Hessie Knows, MD      . aspirin EC tablet 81 mg  81 mg Oral Daily Vaughan Basta, MD   81 mg at 08/30/16 0859  . bisacodyl (DULCOLAX) suppository 10 mg  10 mg Rectal Daily PRN Hessie Knows, MD      . docusate sodium (COLACE) capsule 100 mg  100 mg Oral BID PRN Vaughan Basta, MD      . docusate sodium (COLACE) capsule 100 mg  100 mg Oral BID Hessie Knows, MD   100 mg at 08/30/16 0859  . enoxaparin (LOVENOX) injection 40 mg  40 mg Subcutaneous Q24H Hessie Knows, MD   40 mg at 08/30/16 0858  . magnesium citrate solution 1 Bottle  1 Bottle Oral Once PRN Hessie Knows, MD      . magnesium hydroxide (MILK OF MAGNESIA) suspension 30 mL  30 mL Oral Daily PRN Hessie Knows, MD  30 mL at 08/30/16 0859  . menthol-cetylpyridinium (CEPACOL) lozenge 3 mg  1 lozenge Oral PRN Hessie Knows, MD       Or  . phenol [facility]) mouth spray 1 spray  1 spray Mouth/Throat PRN Hessie Knows, MD      . methocarbamol (ROBAXIN) tablet 500 mg  500 mg Oral Q6H PRN Hessie Knows, MD       Or  . methocarbamol (ROBAXIN) 500 mg in dextrose 5 % 50 mL IVPB  500 mg Intravenous Q6H PRN Hessie Knows, MD      . metoCLOPramide (REGLAN) tablet 5-10 mg  5-10 mg Oral Q8H PRN Hessie Knows, MD       Or  . metoCLOPramide (REGLAN) injection 5-10 mg  5-10 mg Intravenous Q8H PRN Hessie Knows, MD      . morphine 2 MG/ML injection 1 mg  1 mg Intravenous Q1H PRN Hessie Knows, MD      . multivitamin with minerals tablet 1 tablet  1 tablet Oral Daily Vaughan Basta, MD   1 tablet at 08/30/16 0946  . ondansetron (ZOFRAN) tablet 4 mg  4 mg Oral Q6H PRN Hessie Knows, MD       Or  . ondansetron [facility]) injection 4 mg  4 mg Intravenous Q6H PRN Hessie Knows, MD      . oxyCODONE (Oxy IR/ROXICODONE) immediate release tablet 5-10 mg  5-10 mg Oral Q4H PRN Hessie Knows, MD   5 mg at 08/30/16 1100  . Vitamin D (Ergocalciferol) (DRISDOL) capsule 50,000 Units  50,000 Units Oral Q7 days Vaughan Basta, MD   50,000 Units at 08/29/16 2209  . zolpidem (AMBIEN) tablet 5 mg  5 mg Oral QHS PRN Hessie Knows, MD         Discharge Medications: Please see discharge summary for a list of discharge medications.  Relevant Imaging Results:  Relevant Lab Results:   Additional Information  (SSN: [id number])  Kaisyn Reinhold, Veronia Beets, LCSW

## 2016-08-30 NOTE — Progress Notes (Signed)
Physical Therapy Treatment Patient Details Name: Tanya Roy MRN: [id number] DOB: Jan 21, 1929 Today's Date: 08/30/2016    History of Present Illness Pt is a pleasant 83 yo female, admitted to [facility] following a fall when taking out her trash, imaging showed a R distal radius fracture and R intertrochanteric hip fracture, s/p ORIF of R distal radius and R  femur (08/29/16), PMH includes Osteoporosis and arthritis.     PT Comments    Pt awake and willing to participate in PT treatment. She demonstrated some minor cognitive concerns, displayed some difficulty recalling precautions and events, required constant reminders for NWBing for R wrist and cuing for standing up. Worked on improving transfers and bed mobility this afternoon but pt was limited in activity this due to increased  R hip pain and drop in blood pressure with positioning. Blood pressure assessed in supine (131/67), sitting (145/85) and after standing (104/63); she became nauseas when transferring to standing and requested to return to sitting. Pt currently requires mod to max assist for functional mobility and requires frequent cuing and reminders to follow NWBing precautions of the R wrist. Pt will benefit from skilled PT to correct deficits and improve overall function, recommend she transition to STR following hospital stay.   Follow Up Recommendations  SNF     Equipment Recommendations  Rolling walker with 5" wheels;Other (comment) (R platform RW)    Recommendations for Other Services       Precautions / Restrictions Precautions Precautions: Fall Restrictions Weight Bearing Restrictions: Yes RUE Weight Bearing: Non weight bearing RLE Weight Bearing: Weight bearing as tolerated Other Position/Activity Restrictions: NWBing of the R wrist, WBAT R hip    Mobility  Bed Mobility Overal bed mobility: Needs Assistance Bed Mobility: Supine to Sit;Sit to Supine     Supine to sit: Mod assist Sit to supine: Max  assist;+2 for physical assistance   General bed mobility comments: frequent cuing for following NWBing precuations for R Wrist during bed mobility, assistance advancing R LE and scooting to EOB   Transfers Overall transfer level: Needs assistance Equipment used: Rolling walker (2 wheeled) Transfers: Sit to/from Stand Sit to Stand: Max assist         General transfer comment: Pt required frequent cuing for hand placement and NWBing of R wrist, max assist to stand, complained of nausea when standing, increased pain in R hip on second attempt w/ increased symptoms of nausea   Ambulation/Gait             General Gait Details: not attempted pt displayed nausea and dizziness in standing    Stairs            Wheelchair Mobility    Modified Rankin (Stroke Patients Only)       Balance Overall balance assessment: Needs assistance Sitting-balance support: Feet supported;Single extremity supported Sitting balance-Leahy Scale: Fair Sitting balance - Comments: sitting limited secondary to dizziness, required intermittent min to mod assist to maintain upright posture  Postural control: Posterior lean;Left lateral lean Standing balance support: Bilateral upper extremity supported Standing balance-Leahy Scale: Poor Standing balance comment: max assist to maintain standing and cuing for NWBing of R wrist, poor standing tolerance secondary to pain and nausea                     Cognition Arousal/Alertness: Awake/alert Behavior During Therapy: WFL for tasks assessed/performed Overall Cognitive Status: Impaired/Different from baseline Area of Impairment: Following commands;Memory     Memory: Decreased recall of precautions Following  Commands: Follows one step commands consistently;Follows multi-step commands inconsistently       General Comments: pt requires frequent cuing and reminders for maintaining precautions and technique for transfers, tends to easily forget      Exercises General Exercises - Lower Extremity Ankle Circles/Pumps: AROM;Strengthening;Both;10 reps;Supine Quad Sets: AROM;Strengthening;Right;10 reps;Supine Gluteal Sets: AROM;Strengthening;Both;10 reps;Supine Long Arc Quad: AROM;Right;Strengthening;Seated Heel Slides: Strengthening;Right;AAROM;10 reps;Supine (min assist to perform w/ decreased range) Hip ABduction/ADduction: Strengthening;Both;10 reps;AAROM;Supine;Right (min assist for hip abd, ) Straight Leg Raises: AAROM;Strengthening;Right;10 reps;Supine (min assist required)    General Comments        Pertinent Vitals/Pain Pain Assessment: Faces Pain Score: 2  Faces Pain Scale: Hurts whole lot Pain Location: R hip  Pain Descriptors / Indicators: Aching;Grimacing Pain Intervention(s): Monitored during session;Limited activity within patient's tolerance;Repositioned    Home Living Family/patient expects to be discharged to:: Private residence Living Arrangements: Alone Available Help at Discharge: Family;Available PRN/intermittently Type of Home: House Home Access: Stairs to enter Entrance Stairs-Rails: Right;Left Home Layout: One level Home Equipment: Cane - single point;Hand held Tourist information centre manager - 2 wheels;Bedside commode Additional Comments: home equipment was her husband's who pased away approx 2 years ago    Prior Function Level of Independence: Independent      Comments: Pt driving at baseline, independent in ADLs and IADLs, housekeeper for cleaning floors and hires person for yard work otherwise indep with home mgt tasks; no additional falls reports    PT Goals (current goals can now be found in the care plan section) Acute Rehab PT Goals Patient Stated Goal: To get stronger and return home PT Goal Formulation: With patient Time For Goal Achievement: 09/13/16 Potential to Achieve Goals: Fair Progress towards PT goals: Progressing toward goals    Frequency    BID      PT Plan Current plan  remains appropriate    Co-evaluation             End of Session Equipment Utilized During Treatment: Gait belt Activity Tolerance: Patient limited by pain;Other (comment) (nausea and dizziness) Patient left: in bed;with call bell/phone within reach;with SCD's reapplied;with bed alarm set;with family/visitor present Nurse Communication: Mobility status;Other (comment) (nausea and drop in blood pressure) PT Visit Diagnosis: Unsteadiness on feet (R26.81);Muscle weakness (generalized) (M62.81);History of falling (Z91.81);Difficulty in walking, not elsewhere classified (R26.2);Pain Pain - Right/Left: Right Pain - part of body: Hip;Hand     Time: 1353-1419 PT Time Calculation (min) (ACUTE ONLY): 26 min  Charges:                       G Codes:       [employer] Student PT 08/30/2016, 2:37 PM

## 2016-08-30 NOTE — Clinical Social Work Placement (Signed)
   CLINICAL SOCIAL WORK PLACEMENT  NOTE  Date:  08/30/2016  Patient Details  Name: Tanya Roy MRN: [id number] Date of Birth: 19-Apr-1929  Clinical Social Work is seeking post-discharge placement for this patient at the [place] level of care (*CSW will initial, date and re-position this form in  chart as items are completed):  Yes   Patient/family provided with [facility] Work Department's list of facilities offering this level of care within the geographic area requested by the patient (or if unable, by the patient's family).  Yes   Patient/family informed of their freedom to choose among providers that offer the needed level of care, that participate in Medicare, Medicaid or managed care program needed by the patient, have an available bed and are willing to accept the patient.  Yes   Patient/family informed of [facility]'s ownership interest in [facility] and [facility], as well as of the fact that they are under no obligation to receive care at these facilities.  PASRR submitted to EDS on 08/30/16     PASRR number received on 08/30/16     Existing PASRR number confirmed on       FL2 transmitted to all facilities in geographic area requested by pt/family on 08/30/16     FL2 transmitted to all facilities within larger geographic area on       Patient informed that his/her managed care company has contracts with or will negotiate with certain facilities, including the following:            Patient/family informed of bed offers received.  Patient chooses bed at       Physician recommends and patient chooses bed at      Patient to be transferred to   on  .  Patient to be transferred to facility by       Patient family notified on   of transfer.  Name of family member notified:        PHYSICIAN       Additional Comment:    _______________________________________________ Annia Gomm, Veronia Beets, LCSW 08/30/2016, 12:03 PM

## 2016-08-30 NOTE — Clinical Social Work Note (Signed)
Clinical Social Work Assessment  Patient Details  Name: Tanya Roy MRN: [id number] Date of Birth: Sep 28, 1928  Date of referral:  08/30/16               Reason for consult:  Facility Placement                Permission sought to share information with:  Chartered certified accountant granted to share information::  Yes, Verbal Permission Granted  Name::      [place]::   [place]   Relationship::     Contact Information:     Housing/Transportation Living arrangements for the past 2 months:  Single Family Home Source of Information:  Patient, Other (Comment Required) (Sister ) Patient Interpreter Needed:  None Criminal Activity/Legal Involvement Pertinent to Current Situation/Hospitalization:  No - Comment as needed Significant Relationships:  Siblings Lives with:  Self Do you feel safe going back to the place where you live?  Yes Need for family participation in patient care:  Yes (Comment)  Care giving concerns:  Patient lives alone in [place].    Social Worker assessment / plan:  Holiday representative (CSW) received SNF consult. PT is recommending SNF. CSW met with patient and her sister [name] and nephew were at bedside. Patient was alert and oriented X4 and was sitting up in the bed. CSW introduced self and explained role of CSW department. Patient reported that she lives alone in [place]. CSW explained SNF process and that medicare requires a 3 night qualifying inpatient stay in order to pay for SNF. Patient verbalized her understanding and is agreeable to SNF search in [place]. Patient prefers [employer] FL2 complete and faxed out. CSW will continue to follow and assist as needed.   Employment status:  Retired Forensic scientist:  [employer] PT Recommendations:  [place] / Referral to community resources:  [place]  Patient/Family's Response to care:  Patient is  agreeable to [facility] in [place].   Patient/Family's Understanding of and Emotional Response to Diagnosis, Current Treatment, and Prognosis:  Patient was very pleasant and thanked CSW for assistance.   Emotional Assessment Appearance:  Appears stated age Attitude/Demeanor/Rapport:    Affect (typically observed):  Accepting, Adaptable, Pleasant Orientation:  Oriented to Self, Oriented to Place, Oriented to  Time, Oriented to Situation Alcohol / Substance use:  Not Applicable Psych involvement (Current and /or in the community):  No (Comment)  Discharge Needs  Concerns to be addressed:  Discharge Planning Concerns Readmission within the last 30 days:  No Current discharge risk:  Dependent with Mobility Barriers to Discharge:  Continued Medical Work up   [employer], Veronia Beets, LCSW 08/30/2016, 12:04 PM

## 2016-08-30 NOTE — Evaluation (Signed)
Occupational Therapy Evaluation Patient Details Name: Tanya Roy MRN: [id number] DOB: [birth year] Today's Date: 08/30/2016    History of Present Illness Pt is a pleasant 83 yo female, admitted to [facility] following a fall when taking out her trash, imaging showed a R distal radius fracture and R intertrochanteric hip fracture, s/p ORIF of R distal radius and R  femur (08/29/16), PMH includes Osteoporosis and arthritis.    Clinical Impression   Pt is an 83 year old female s/p ORIF of R distal radius and R femur who lives at home alone with 3 steps to enter. Pt was independent in all ADLs (hired help for cleaning floors and yard work only) prior to surgery and is eager to return to [facility].  Pt is currently limited in functional ADLs due to pain, decreased ROM, slight dizziness/nausea, and decreased safety awareness and recall of R wrist NWB'ing status.  Pt requires mod-maximum assist for dressing and bathing skills due to pain and impaired RUE functional use as she is right hand dominant. Pt would benefit from continued skilled OT services for education in assistive devices, functional mobility, and education in recommendations for home and routines modifications to increase safety and prevent falls.  Pt is a good candidate for SNF to continue rehabilitation.      Follow Up Recommendations  SNF    Equipment Recommendations  None recommended by OT    Recommendations for Other Services       Precautions / Restrictions Precautions Precautions: Fall Restrictions Weight Bearing Restrictions: Yes RUE Weight Bearing: Non weight bearing (through R wrist) RLE Weight Bearing: Weight bearing as tolerated Other Position/Activity Restrictions: NWBing of the R wrist, WBAT R hip      Mobility Bed Mobility Overal bed mobility: Needs Assistance Bed Mobility: Supine to Sit;Sit to Supine     Supine to sit: Min assist;Mod assist;HOB elevated Sit to supine: Mod assist   General bed mobility  comments: frequent verbal cues for NWB'ing through R wrist, min-mod A to support RLE, mod A for moving trunk into upright position EOB for sup>sit  Transfers Overall transfer level: Needs assistance Equipment used: Rolling walker (2 wheeled) Transfers: Sit to/from Stand Sit to Stand: Mod assist;Min assist         General transfer comment: initial min assist to initiate sit to stand transfer with verbal cues for R wrist NWB'ing and min-mod assist upon standing to correct posterior lean and verbal cues for hand placement on RW    Balance Overall balance assessment: Needs assistance Sitting-balance support: Feet supported;Single extremity supported Sitting balance-Leahy Scale: Good Sitting balance - Comments: sitting limited secondary to dizziness  Postural control: Posterior lean Standing balance support: Bilateral upper extremity supported Standing balance-Leahy Scale: Fair Standing balance comment: requires use of R platform RW to improve stability secondary to R hip pain, limited in standing tolerance secondary to slight dizziness after standing 1 min                            ADL Overall ADL's : Needs assistance/impaired Eating/Feeding: Bed level;Set up   Grooming: Bed level;Set up;Supervision/safety   Upper Body Bathing: Moderate assistance;Sitting   Lower Body Bathing: Moderate assistance;Sitting/lateral leans;Maximal assistance   Upper Body Dressing : Sitting;Set up;Cueing for compensatory techniques;Moderate assistance;Cueing for UE precautions   Lower Body Dressing: Maximal assistance;Sitting/lateral leans;Sit to/from stand;Cueing for compensatory techniques;With adaptive equipment     Toilet Transfer Details (indicate cue type and reason): did not attempt  due to safety this session           General ADL Comments: generally mod-max A for LB ADL, mod A for UB ADL with verbal cues required to maintain R wrist NWB status     Vision Baseline  Vision/History: Wears glasses Wears Glasses: Reading only Patient Visual Report: No change from baseline Vision Assessment?: No apparent visual deficits     Perception     Praxis Praxis Praxis tested?: Within functional limits    Pertinent Vitals/Pain Pain Assessment: 0-10 Pain Score: 2  Faces Pain Scale: Hurts little more Pain Location: R hip  Pain Descriptors / Indicators: Aching Pain Intervention(s): Limited activity within patient's tolerance;Monitored during session;Repositioned     Hand Dominance Right   Extremity/Trunk Assessment Upper Extremity Assessment Upper Extremity Assessment: RUE deficits/detail;LUE deficits/detail;Generalized weakness RUE Deficits / Details: NWB through wrist, grossly 4-/5, shoulder/elbow ROM WFL, unable to assess fully due to R wrist precautions LUE Deficits / Details: grossly 4-/5   Lower Extremity Assessment Lower Extremity Assessment: Defer to PT evaluation;RLE deficits/detail RLE Deficits / Details: decreased gross strength at least 3-/5       Communication Communication Communication: No difficulties   Cognition Arousal/Alertness: Awake/alert Behavior During Therapy: WFL for tasks assessed/performed Overall Cognitive Status: Within Functional Limits for tasks assessed                 General Comments: generally WFL, required slight increase in time to understand simple instructions, continue to assess   General Comments       Exercises       Shoulder Instructions      Home Living Family/patient expects to be discharged to:: Private residence Living Arrangements: Alone Available Help at Discharge: Family;Available PRN/intermittently Type of Home: House Home Access: Stairs to enter [employer] of Steps: 3 Entrance Stairs-Rails: Right;Left Home Layout: One level     Bathroom Shower/Tub: Tub/shower unit Shower/tub characteristics: Curtain Biochemist, clinical: Handicapped height Bathroom Accessibility:  Yes How Accessible: Accessible via walker Home Equipment: Cane - single point;Hand held Tourist information centre manager - 2 wheels;Bedside commode   Additional Comments: home equipment was her husband's who pased away approx 2 years ago      Prior Functioning/Environment Level of Independence: Independent        Comments: Pt driving at baseline, independent in ADLs and IADLs, housekeeper for cleaning floors and hires person for yard work otherwise indep with home mgt tasks; no additional falls reports         OT Problem List: Decreased strength;Pain;Decreased range of motion;Impaired UE functional use;Decreased knowledge of precautions;Decreased knowledge of use of DME or AE;Decreased safety awareness;Decreased activity tolerance      OT Treatment/Interventions: Self-care/ADL training;Therapeutic exercise;Therapeutic activities;Energy conservation;Patient/family education;DME and/or AE instruction    OT Goals(Current goals can be found in the care plan section) Acute Rehab OT Goals Patient Stated Goal: To get stronger and return home OT Goal Formulation: With patient/family Time For Goal Achievement: 09/13/16 Potential to Achieve Goals: Good ADL Goals Pt Will Perform Upper Body Dressing: with set-up;with supervision;sitting (while maintaining RUE NWB) Pt Will Perform Lower Body Dressing: with adaptive equipment;sit to/from stand;sitting/lateral leans;with min assist (while maintaining NWB RUE) Pt Will Transfer to Toilet: with min assist;ambulating;bedside commode (RW for ambulation, NWB RUE)  OT Frequency: Min 2X/week   Barriers to D/C: Inaccessible home environment;Decreased caregiver support          Co-evaluation              End of Session Equipment Utilized  During Treatment: Gait belt;Rolling walker  Activity Tolerance: Other (comment);Patient limited by fatigue (pt limited by slight dizziness) Patient left: in bed;with call bell/phone within reach;with bed alarm set;with  family/visitor present;with SCD's reapplied  OT Visit Diagnosis: Other abnormalities of gait and mobility (R26.89);History of falling (Z91.81);Muscle weakness (generalized) (M62.81);Pain;Dizziness and giddiness (R42) Pain - Right/Left: Right Pain - part of body: Hand;Hip                ADL either performed or assessed with clinical judgement  Time: 8833-7445 OT Time Calculation (min): 43 min Charges:  OT General Charges $OT Visit: 1 Procedure OT Evaluation $OT Eval Moderate Complexity: 1 Procedure OT Treatments $Self Care/Home Management : 23-37 mins G-Codes:     Jeni Salles, MPH, MS, OTR/L ascom [phone] 08/30/16, 12:36 PM

## 2016-08-30 NOTE — Progress Notes (Signed)
[place] [facility] [place] NAME: Tanya Roy    MR#:  [id number]  DATE OF BIRTH:  12-05-1928  SUBJECTIVE: Admitted for right hip fracture, right wrist fracture status post ORIF for both. Seen by physical therapy. Having pain issues but other than that no other complaints. No fever.   CHIEF COMPLAINT:   Chief Complaint  Patient presents with  . Fall  . Hip Pain  . Wrist Pain    REVIEW OF SYSTEMS:   ROS CONSTITUTIONAL: No fever, fatigue or weakness.  EYES: No blurred or double vision.  EARS, NOSE, AND THROAT: No tinnitus or ear pain.  RESPIRATORY: No cough, shortness of breath, wheezing or hemoptysis.  CARDIOVASCULAR: No chest pain, orthopnea, edema.  GASTROINTESTINAL: No nausea, vomiting, diarrhea or abdominal pain.  GENITOURINARY: No dysuria, hematuria.  ENDOCRINE: No polyuria, nocturia,  HEMATOLOGY: No anemia, easy bruising or bleeding SKIN: No rash or lesion. MUSCULOSKELETAL:  Pain in right hand, right leg. NEUROLOGIC: No tingling, numbness, weakness.  PSYCHIATRY: No anxiety or depression.   DRUG ALLERGIES:  No Known Allergies  VITALS:  Blood pressure (!) 118/52, pulse 77, temperature 97.9 F (36.6 C), temperature source Oral, resp. rate 14, height 5\' 6"  (1.676 m), weight 63.5 kg (140 lb), SpO2 100 %.  PHYSICAL EXAMINATION:  GENERAL:  83 y.o.-year-old patient lying in the bed with no acute distress.  EYES: Pupils equal, round, reactive to light and accommodation. No scleral icterus. Extraocular muscles intact.  HEENT: Head atraumatic, normocephalic. Oropharynx and nasopharynx clear.  NECK:  Supple, no jugular venous distention. No thyroid enlargement, no tenderness.  LUNGS: Normal breath sounds bilaterally, no wheezing, rales,rhonchi or crepitation. No use of accessory muscles of respiration.  CARDIOVASCULAR: S1, S2 normal. No murmurs, rubs, or gallops.  ABDOMEN: Soft, nontender, nondistended. Bowel sounds present. No  organomegaly or mass.  EXTREMITIES: No pedal edema, cyanosis, or clubbing.  NEUROLOGIC: Cranial nerves II through XII are intact. Muscle strength 5/5 in all extremities. Sensation intact. Gait not checked.  PSYCHIATRIC: The patient is alert and oriented x 3.  SKIN: No obvious rash, lesion, or ulcer.  atient has dressing intact for right upper extremity, right lower extremity without swelling, drainage. Intact in ROM for fingers , foot and toes. on exam. Neurovascularly intact.  LABORATORY PANEL:   CBC  Recent Labs Lab 08/30/16 0413  WBC 12.5*  HGB 11.6*  HCT 34.4*  PLT 181   ------------------------------------------------------------------------------------------------------------------  Chemistries   Recent Labs Lab 08/30/16 0413  NA 137  K 4.4  CL 106  CO2 23  GLUCOSE 170*  BUN 22*  CREATININE 0.84  CALCIUM 8.1*   ------------------------------------------------------------------------------------------------------------------  Cardiac Enzymes No results for input(s): TROPONINI in the last 168 hours. ------------------------------------------------------------------------------------------------------------------  RADIOLOGY:  Dg Chest 1 View  Result Date: 08/29/2016 CLINICAL DATA:  Post fall, fell in yard while picking the trash can EXAM: CHEST 1 VIEW COMPARISON:  None. FINDINGS: Cardiomediastinal silhouette is unremarkable. Mild thoracic dextroscoliosis. Diffuse osteopenia. Probable chronic mild interstitial prominence bilaterally. No infiltrate or pulmonary edema. IMPRESSION: No active disease. Mild hyperinflation. Probable chronic mild interstitial prominence. Mild thoracic dextroscoliosis. Diffuse osteopenia. Electronically Signed   By: Lahoma Crocker M.D.   On: 08/29/2016 13:28   Dg Wrist 2 Views Right  Result Date: 08/29/2016 CLINICAL DATA:  Post op fracture EXAM: RIGHT WRIST - 2 VIEW COMPARISON:  08/29/2016 FINDINGS: Casting material obscures bone detail. Interval  surgical plate and multiple screw fixation of the distal radius across a distal radius fracture  with restoration of anatomic alignment. Nondisplaced distal ulna fracture. IMPRESSION: Interval surgical plate and screw fixation of distal radius fracture with restoration of alignment. No change in alignment of nondisplaced distal ulna fracture Electronically Signed   By: Donavan Foil M.D.   On: 08/29/2016 19:09   Dg Wrist Complete Right  Result Date: 08/29/2016 CLINICAL DATA:  Status post fall. EXAM: RIGHT WRIST - COMPLETE 3+ VIEW COMPARISON:  None. FINDINGS: Nondisplaced fracture of the distal radial metaphysis. Mild apex volar angulation with minimal dorsal displacement. Nondisplaced fracture of the distal ulnar metaphysis. No other fracture or dislocation. Generalized osteopenia. Soft tissue swelling along the volar aspect of the right wrist. IMPRESSION: 1. Nondisplaced fracture of the distal radial metaphysis. Mild apex volar angulation with minimal dorsal displacement. 2. Nondisplaced fracture of the distal ulnar metaphysis. Electronically Signed   By: Kathreen Devoid   On: 08/29/2016 13:22   Ct Head Wo Contrast  Result Date: 08/29/2016 CLINICAL DATA:  Fall in yard today.  Initial encounter. EXAM: CT HEAD WITHOUT CONTRAST CT CERVICAL SPINE WITHOUT CONTRAST TECHNIQUE: Multidetector CT imaging of the head and cervical spine was performed following the standard protocol without intravenous contrast. Multiplanar CT image reconstructions of the cervical spine were also generated. COMPARISON:  None. FINDINGS: CT HEAD FINDINGS Brain: No evidence of acute infarction, hemorrhage, hydrocephalus, extra-axial collection or mass lesion/mass effect. Vascular: No hyperdense vessel or unexpected calcification. Skull: Negative for fracture. Lucency in the high left parietal bone is likely hemangioma. Sinuses/Orbits: No acute finding. CT CERVICAL SPINE FINDINGS Alignment: No traumatic malalignment. Skull base and vertebrae:  Negative for fracture Soft tissues and spinal canal: No prevertebral fluid or swelling. No visible canal hematoma. Disc levels: Diffuse cervical spine degeneration. Multilevel facet spurring. C3-4 mild to moderate spinal stenosis. Upper chest: Negative IMPRESSION: No evidence of acute intracranial or cervical spine injury. Electronically Signed   By: Monte Fantasia M.D.   On: 08/29/2016 12:56   Ct Cervical Spine Wo Contrast  Result Date: 08/29/2016 CLINICAL DATA:  Fall in yard today.  Initial encounter. EXAM: CT HEAD WITHOUT CONTRAST CT CERVICAL SPINE WITHOUT CONTRAST TECHNIQUE: Multidetector CT imaging of the head and cervical spine was performed following the standard protocol without intravenous contrast. Multiplanar CT image reconstructions of the cervical spine were also generated. COMPARISON:  None. FINDINGS: CT HEAD FINDINGS Brain: No evidence of acute infarction, hemorrhage, hydrocephalus, extra-axial collection or mass lesion/mass effect. Vascular: No hyperdense vessel or unexpected calcification. Skull: Negative for fracture. Lucency in the high left parietal bone is likely hemangioma. Sinuses/Orbits: No acute finding. CT CERVICAL SPINE FINDINGS Alignment: No traumatic malalignment. Skull base and vertebrae: Negative for fracture Soft tissues and spinal canal: No prevertebral fluid or swelling. No visible canal hematoma. Disc levels: Diffuse cervical spine degeneration. Multilevel facet spurring. C3-4 mild to moderate spinal stenosis. Upper chest: Negative IMPRESSION: No evidence of acute intracranial or cervical spine injury. Electronically Signed   By: Monte Fantasia M.D.   On: 08/29/2016 12:56   Dg Hip Operative Unilat W Or W/o Pelvis Right  Result Date: 08/29/2016 CLINICAL DATA:  Right hip fracture EXAM: OPERATIVE RIGHT HIP WITH PELVIS COMPARISON:  08/29/2016 FLUOROSCOPY TIME:  Radiation Exposure Index (as provided by the fluoroscopic device): 20.8 mGy If the device does not provide the  exposure index: Fluoroscopy Time:  1 minutes 42 seconds Number of Acquired Images:  3 FINDINGS: Medullary rod is noted within the right femur with a compression screw traversing the femoral neck. Distal fixation screw is noted as well. The  fracture fragments are in near anatomic alignment. IMPRESSION: Status post ORIF of proximal right femoral fracture. Electronically Signed   By: Inez Catalina M.D.   On: 08/29/2016 17:14   Dg Hip Unilat W Or Wo Pelvis 2-3 Views Right  Result Date: 08/29/2016 CLINICAL DATA:  Fall. EXAM: DG HIP (WITH OR WITHOUT PELVIS) 2-3V RIGHT COMPARISON:  None. FINDINGS: There is an acute and comminuted intertrochanteric fracture involving the proximal right femur. Medial angulation of the distal fracture fragments noted. Chronic fracture deformity involving the left pubic bone identified. IMPRESSION: 1. Acute and comminuted intertrochanteric fracture affects the proximal right femur. Electronically Signed   By: Kerby Moors M.D.   On: 08/29/2016 13:25    EKG:   Orders placed or performed during the hospital encounter of 03/25/15  . EKG test  . EKG test    ASSESSMENT AND PLAN:  #1 right hip fracture, right wrist fracture status post ORIF for  Right wrist ,, intramedullary nail for the right hip. Patient will be seen by physical therapy, continue Lovenox, pain control.  #2. Acute postop blood loss anemia: Recheck CBC tomorrow, #3.Continue incentive spirometry. D/w family CM consult, All the records are reviewed and case discussed with Care Management/Social Workerr. Management plans discussed with the patient, family and they are in agreement.  CODE STATUS: full  TOTAL TIME TAKING CARE OF THIS PATIENT:35  minutes.   POSSIBLE D/C IN 1-2 DAYS, DEPENDING ON CLINICAL CONDITION.   Epifanio Lesches M.D on 08/30/2016 at 10:20 AM  Between 7am to 6pm - Pager - [phone]  After 6pm go to [email or web address] - password [email or web address]  Office   [phone]  CC: Primary care physician; BABAOFF, Caryl Bis, MD   Note: This dictation was prepared with Dragon dictation along with smaller phrase technology. Any transcriptional errors that result from this process are unintentional.

## 2016-08-30 NOTE — Progress Notes (Signed)
   Subjective: 1 Day Post-Op Procedure(s) (LRB): INTRAMEDULLARY (IM) NAIL FEMORAL (Right) OPEN REDUCTION INTERNAL FIXATION (ORIF) WRIST FRACTURE (Right) Patient reports pain as mild.   Patient is well, and has had no acute complaints or problems Denies any CP, SOB, ABD pain. We will continue therapy today.  Plan is to go Rehab after hospital stay.  Objective: Vital signs in last 24 hours: Temp:  [97.4 F (36.3 C)-98.3 F (36.8 C)] 97.9 F (36.6 C) (03/07 0734) Pulse Rate:  [64-89] 77 (03/07 0734) Resp:  [13-21] 14 (03/07 0734) BP: (114-193)/(49-90) 118/52 (03/07 0734) SpO2:  [95 %-100 %] 100 % (03/07 0734) FiO2 (%):  [21 %] 21 % (03/06 1920) Weight:  [63.5 kg (140 lb)] 63.5 kg (140 lb) (03/06 1140)  Intake/Output from previous day: 03/06 0701 - 03/07 0700 In: 2048.8 [P.O.:480; I.V.:1368.8; IV Piggyback:200] Out: 795 [Urine:645; Blood:150] Intake/Output this shift: No intake/output data recorded.   Recent Labs  08/29/16 1228 08/30/16 0413  HGB 13.9 11.6*    Recent Labs  08/29/16 1228 08/30/16 0413  WBC 7.1 12.5*  RBC 4.21 3.58*  HCT 40.3 34.4*  PLT 178 181    Recent Labs  08/29/16 1228 08/30/16 0413  NA 136 137  K 3.9 4.4  CL 105 106  CO2 24 23  BUN 22* 22*  CREATININE 0.92 0.84  GLUCOSE 112* 170*  CALCIUM 8.7* 8.1*    Recent Labs  08/29/16 1228  INR 1.01    EXAM General - Patient is Alert, Appropriate and Oriented Extremity - Neurovascular intact Sensation intact distally Intact pulses distally Incision: dressing C/D/I and no drainage No cellulitis present Compartment soft  RUE and RLE NVI with out swelling Dressing - dressing C/D/I and no drainage Motor Function - intact, moving fingers, foot and toes well on exam.   Past Medical History:  Diagnosis Date  . Abnormal mammogram of right breast 2012   Microcalcifications  . Arthritis   . Osteoporosis     Assessment/Plan:   1 Day Post-Op Procedure(s) (LRB): INTRAMEDULLARY (IM)  NAIL FEMORAL (Right) OPEN REDUCTION INTERNAL FIXATION (ORIF) WRIST FRACTURE (Right) Active Problems:   Hip fracture (HCC)   Acute post op blood loss anemia   Estimated body mass index is 22.6 kg/m as calculated from the following:   Height as of this encounter: 5\' 6"  (1.676 m).   Weight as of this encounter: 63.5 kg (140 lb). Advance diet Up with therapy Acute post op blood loss anemia - recheck labs in the am CM to assist with discharge  DVT Prophylaxis - Lovenox, Foot Pumps and TED hose Weight-Bearing as tolerated to right leg, NWB [place], PA-C [place] 08/30/2016, 8:14 AM

## 2016-08-30 NOTE — Progress Notes (Signed)
Bladder scan showed 313ml of urine. Patient stated the need to void, nurse assisted patient on the bedpan and patient was able to void on her own. Urine measured 262ml.

## 2016-08-30 NOTE — Evaluation (Signed)
Physical Therapy Evaluation Patient Details Name: Tanya Roy MRN: [id number] DOB: 04/25/1929 Today's Date: 08/30/2016   History of Present Illness  Pt is a pleasant 83 yo female, admitted to [facility] following a fall when taking out her trash, imaging showed a R distal radius fracture and R intertrochanteric hip fracture, s/p ORIF of R distal radius and R  femur (08/29/16), PMH includes Osteoporosis and arthritis.     Clinical Impression  Pt awake, alert with pain under control, was willing to participate in PT evaluation. Stated she had some discomfort in her R hip that increased w/ movement. Pt displays strength deficits of the R LE grossly at least 3-/5 secondary to pain and recent surgery. Did not assess R UE formally due to R wrist NWBing precautions, patient does have active movement of the R fingers and thumb and is able to actively perform shoulder and elbow motions. Pt requires moderate assistance for bed mobility due to decreased strength and increased pain, she required frequent cuing to maintain NWBing status of the R wrist. Pt able to move to sitting. Performed transfers to standing w/  Platform RW and required max assist and cuing for safety; patient complained of nausea and dizziness, so was returned to sitting, blood pressure was 101/70, she stated she has not eaten anything since yesterday morning. Attempted to stand again when symptoms resided but still experienced nausea so was returned to supine. Overall pt is severely limited in safe functional mobility due to strength deficits, Wbing precautions, and decreased activity tolerance. She will benefit from skilled PT to correct deficits, recommend she transition to STR following acute hospital stay.     Follow Up Recommendations SNF    Equipment Recommendations  Rolling walker with 5" wheels;Other (comment) (w/ platform)    Recommendations for Other Services       Precautions / Restrictions Precautions Precautions:  Fall Restrictions Weight Bearing Restrictions: Yes RUE Weight Bearing: Non weight bearing (NWBing through R wrist) RLE Weight Bearing: Weight bearing as tolerated Other Position/Activity Restrictions: NWBing of the R wrist, WBAT R hip      Mobility  Bed Mobility Overal bed mobility: Needs Assistance Bed Mobility: Supine to Sit;Sit to Supine     Supine to sit: Mod assist Sit to supine: Max assist;+2 for physical assistance   General bed mobility comments: Pt able to advance L LE and requires some assitance to move R LE, frequent cuing for no WBing through the R wrist, mod assist to move trunk into upright position  Transfers Overall transfer level: Needs assistance Equipment used: Rolling walker (2 wheeled) Transfers: Sit to/from Stand Sit to Stand: Max assist         General transfer comment: cuing for push off w/ L UE and no push off w/ R UE,  requires increased time to move into upright posture requiring max assist initially but able to support self in standing,   Ambulation/Gait             General Gait Details: not attempted pt displayed nausea and dizziness in standing   Stairs            Wheelchair Mobility    Modified Rankin (Stroke Patients Only)       Balance Overall balance assessment: Needs assistance Sitting-balance support: Feet supported;Single extremity supported Sitting balance-Leahy Scale: Good Sitting balance - Comments: sitting limited secondary to dizziness  Postural control: Posterior lean Standing balance support: Bilateral upper extremity supported Standing balance-Leahy Scale: Fair Standing balance comment: requires use  of R platform RW to improve stability secondary to R hip pain, limited in standing secondary to nausea and dizziness                              Pertinent Vitals/Pain Pain Assessment: Faces Faces Pain Scale: Hurts little more Pain Location: R hip  Pain Descriptors / Indicators: Aching Pain  Intervention(s): Monitored during session;Limited activity within patient's tolerance;Repositioned;Patient requesting pain meds-RN notified    Home Living Family/patient expects to be discharged to:: Private residence Living Arrangements: Alone   Type of Home: House Home Access: Stairs to enter Entrance Stairs-Rails: Right Entrance Stairs-Number of Steps: 3 Home Layout: One level Home Equipment: None      Prior Function Level of Independence: Independent         Comments: Pt driving at baseline, independent in ADLs and IADLs     Hand Dominance   Dominant Hand: Right    Extremity/Trunk Assessment   Upper Extremity Assessment Upper Extremity Assessment: Defer to OT evaluation    Lower Extremity Assessment Lower Extremity Assessment: RLE deficits/detail RLE Deficits / Details: decreased gross strength at least 3-/5       Communication   Communication: No difficulties  Cognition Arousal/Alertness: Awake/alert Behavior During Therapy: WFL for tasks assessed/performed Overall Cognitive Status: Within Functional Limits for tasks assessed                      General Comments      Exercises General Exercises - Lower Extremity Ankle Circles/Pumps: AROM;Strengthening;Both;10 reps;Supine Quad Sets: AROM;Strengthening;Right;10 reps;Supine Gluteal Sets: AROM;Strengthening;Both;10 reps;Supine Heel Slides: Strengthening;Right;AAROM;10 reps;Supine (min assist to perform w/ decreased range) Hip ABduction/ADduction: Strengthening;Both;10 reps;AAROM;Supine;Right (min assist for hip abd, ) Straight Leg Raises: AAROM;Strengthening;Right;10 reps;Supine (min assist required)   Assessment/Plan    PT Assessment Patient needs continued PT services  PT Problem List Decreased strength;Decreased range of motion;Decreased activity tolerance;Decreased balance;Decreased mobility;Decreased knowledge of use of DME;Decreased knowledge of precautions;Pain       PT Treatment  Interventions DME instruction;Gait training;Stair training;Functional mobility training;Balance training;Therapeutic exercise;Therapeutic activities;Patient/family education    PT Goals (Current goals can be found in the Care Plan section)  Acute Rehab PT Goals Patient Stated Goal: To get stronger and return home PT Goal Formulation: With patient Time For Goal Achievement: 09/13/16 Potential to Achieve Goals: Good    Frequency BID   Barriers to discharge Inaccessible home environment;Decreased caregiver support      Co-evaluation               End of Session Equipment Utilized During Treatment: Gait belt Activity Tolerance: Other (comment) (limited by nausea and dizziness ) Patient left: in bed;with call bell/phone within reach;with family/visitor present;with bed alarm set Nurse Communication: Mobility status;Other (comment) (nausea and orthostatic w/ positional changes) PT Visit Diagnosis: Unsteadiness on feet (R26.81);Muscle weakness (generalized) (M62.81);History of falling (Z91.81);Difficulty in walking, not elsewhere classified (R26.2);Pain Pain - Right/Left: Right Pain - part of body: Hip;Hand         Time: 4287-6811 PT Time Calculation (min) (ACUTE ONLY): 40 min   Charges:         PT G Codes:         [employer] Student PT 08/30/2016, 11:15 AM

## 2016-08-31 NOTE — Progress Notes (Signed)
Physical Therapy Treatment Patient Details Name: Tanya Roy MRN: [id number] DOB: [birth year] Today's Date: 08/31/2016    History of Present Illness Pt is a pleasant 83 yo female, admitted to [facility] following a fall when taking out her trash, imaging showed a R distal radius fracture and R intertrochanteric hip fracture, s/p ORIF of R distal radius and R  femur (08/29/16), PMH includes Osteoporosis and arthritis.     PT Comments    Pt stated she was feeling much better then yesterday and willing to participate in PT treatment. She demonstrated improved mobility and able to perform bed mobility and transfers with min assist and use of R platform RW. Patient's blood pressure dropped from 149/50 to 119/50 when transferring to standing on the first attempt with signs of nausea; symptoms improved in sitting and she was able to ambulate on the second attempt. Pt still displays some safety concerns and is forgetful of R Wrist precautions and required frequent reminders to not bear weight through the R wrist. Pt ambulated to chair with min assist +2 and was limited by R hip pain and fatigue. Overall patient is progressing and still presents with decreased strength, activity tolerance and safety awareness as well as increased pain that limits safe functional mobility. She will continue to benefit from skilled PT to correct deficits above.      Follow Up Recommendations  SNF     Equipment Recommendations       Recommendations for Other Services       Precautions / Restrictions Precautions Precautions: Fall Restrictions Weight Bearing Restrictions: Yes RUE Weight Bearing: Non weight bearing RLE Weight Bearing: Weight bearing as tolerated Other Position/Activity Restrictions: NWBing of the R wrist, WBAT R hip    Mobility  Bed Mobility Overal bed mobility: Needs Assistance Bed Mobility: Supine to Sit     Supine to sit: Min assist;HOB elevated     General bed mobility comments:  demonstrated improved bed mobility to sitting at EOB, requires min assist and cuing to advance LE and sit in upright posture   Transfers Overall transfer level: Needs assistance Equipment used: Rolling walker (2 wheeled) Transfers: Sit to/from Stand Sit to Stand: Mod assist;+2 physical assistance         General transfer comment: pt demonstrated increased time to move from sitting to standing and cuing for hand placement and NWBing precuations of the R wrist, increase in pain when weight bearing through the R hip but overall demonstrated improved standing   Ambulation/Gait Ambulation/Gait assistance: Min assist;+2 physical assistance Ambulation Distance (Feet): 2 Feet Assistive device: Right platform walker Gait Pattern/deviations: Step-to pattern;Antalgic;Decreased step length - left;Decreased stance time - right;Decreased weight shift to right;Trunk flexed;Shuffle;Decreased stride length;Decreased step length - right   Gait velocity interpretation: <1.8 ft/sec, indicative of risk for recurrent falls General Gait Details: pt required frequent cuing for stepping and safe use of the R platfrom RW, ambulated to chair, demonstrated increased pain when WBing throught the R side which decresed L step length, pt is somewhat hesitant to Weight bear throught the R hip   Stairs            Wheelchair Mobility    Modified Rankin (Stroke Patients Only)       Balance Overall balance assessment: Needs assistance Sitting-balance support: Feet supported;Single extremity supported Sitting balance-Leahy Scale: Good Sitting balance - Comments: improved sitting balance today, no dizziness, able to maintain seated posture w/ occasional hand support, required cuing for foot placement, able to self correct posterior  and lateral lean    Standing balance support: Bilateral upper extremity supported Standing balance-Leahy Scale: Fair Standing balance comment: min assist to maintain standing  posture, cuing to achieve more upright position w/ R platform RW, requires UE support at all times in standing for improved stability                    Cognition Arousal/Alertness: Awake/alert Behavior During Therapy: WFL for tasks assessed/performed Overall Cognitive Status: Impaired/Different from baseline Area of Impairment: Following commands;Memory     Memory: Decreased recall of precautions Following Commands: Follows one step commands consistently;Follows multi-step commands inconsistently       General Comments: pt requires frequent cuing and reminders for maintaining precautions and technique for transfers, tends to easily forget     Exercises Other Exercises Other Exercises: LE supine therex to improve strength for functional tasks: AROM; 1x10; ankle pumps, heel slides, SAQs, hip abd/add, SLR, cuing for safe technique, min assist for SLR     General Comments        Pertinent Vitals/Pain Pain Assessment: Faces Faces Pain Scale: Hurts even more Pain Location: R hip  Pain Descriptors / Indicators: Aching;Grimacing Pain Intervention(s): Monitored during session;Premedicated before session;Limited activity within patient's tolerance;Repositioned    Home Living                      Prior Function            PT Goals (current goals can now be found in the care plan section) Acute Rehab PT Goals Patient Stated Goal: To get stronger and return home PT Goal Formulation: With patient Time For Goal Achievement: 09/13/16 Potential to Achieve Goals: Fair Progress towards PT goals: Progressing toward goals    Frequency    BID      PT Plan Current plan remains appropriate    Co-evaluation             End of Session Equipment Utilized During Treatment: Gait belt Activity Tolerance: Patient limited by pain Patient left: in chair;with call bell/phone within reach;with family/visitor present;with chair alarm set;with SCD's reapplied Nurse  Communication: Mobility status PT Visit Diagnosis: Unsteadiness on feet (R26.81);Muscle weakness (generalized) (M62.81);History of falling (Z91.81);Difficulty in walking, not elsewhere classified (R26.2);Pain Pain - Right/Left: Right Pain - part of body: Hip;Hand     Time: 5449-2010 PT Time Calculation (min) (ACUTE ONLY): 31 min  Charges:                       G Codes:       [employer] Student PT 08/31/2016, 12:12 PM

## 2016-08-31 NOTE — Progress Notes (Signed)
OT Cancellation Note  Patient Details Name: Tanya Roy MRN: [id number] DOB: April 09, 1929   Cancelled Treatment:    Reason Eval/Treat Not Completed: Other (comment). Pt with family present in room, anticipated lunch to arrive shortly. Agreeable to OT session focused on ADL training with A/E after lunch. Will re-attempt OT treatment session this afternoon as pt is available.   Jeni Salles, MPH, MS, OTR/L ascom [phone] 08/31/16, 12:08 PM

## 2016-08-31 NOTE — Progress Notes (Signed)
   Subjective: 2 Days Post-Op Procedure(s) (LRB): INTRAMEDULLARY (IM) NAIL FEMORAL (Right) OPEN REDUCTION INTERNAL FIXATION (ORIF) WRIST FRACTURE (Right) Patient reports pain as mild.  Moderate with movement of RLE Patient is well, and has had no acute complaints or problems Denies any CP, SOB, ABD pain. We will continue therapy today.  Plan is to go Rehab after hospital stay.  Objective: Vital signs in last 24 hours: Temp:  [97.2 F (36.2 C)-98.5 F (36.9 C)] 97.2 F (36.2 C) (03/08 0726) Pulse Rate:  [87-96] 93 (03/08 0726) Resp:  [14-18] 14 (03/08 0726) BP: (115-151)/(51-72) 147/51 (03/08 0726) SpO2:  [96 %-98 %] 97 % (03/08 0726)  Intake/Output from previous day: 03/07 0701 - 03/08 0700 In: 240 [P.O.:240] Out: 250 [Urine:250] Intake/Output this shift: Total I/O In: -  Out: 125 [Urine:125]   Recent Labs  08/29/16 1228 08/30/16 0413  HGB 13.9 11.6*    Recent Labs  08/29/16 1228 08/30/16 0413  WBC 7.1 12.5*  RBC 4.21 3.58*  HCT 40.3 34.4*  PLT 178 181    Recent Labs  08/29/16 1228 08/30/16 0413  NA 136 137  K 3.9 4.4  CL 105 106  CO2 24 23  BUN 22* 22*  CREATININE 0.92 0.84  GLUCOSE 112* 170*  CALCIUM 8.7* 8.1*    Recent Labs  08/29/16 1228  INR 1.01    EXAM General - Patient is Alert, Appropriate and Oriented Extremity - Neurovascular intact Sensation intact distally Intact pulses distally Incision: dressing C/D/I and no drainage No cellulitis present Compartment soft  RUE and RLE NVI with out swelling Dressing - dressing C/D/I and no drainage Motor Function - intact, moving fingers, foot and toes well on exam.   Past Medical History:  Diagnosis Date  . Abnormal mammogram of right breast 2012   Microcalcifications  . Arthritis   . Osteoporosis     Assessment/Plan:   2 Days Post-Op Procedure(s) (LRB): INTRAMEDULLARY (IM) NAIL FEMORAL (Right) OPEN REDUCTION INTERNAL FIXATION (ORIF) WRIST FRACTURE (Right) Active Problems:  Hip fracture (HCC)   Acute post op blood loss anemia   Estimated body mass index is 22.6 kg/m as calculated from the following:   Height as of this encounter: 5\' 6"  (1.676 m).   Weight as of this encounter: 63.5 kg (140 lb). Advance diet Up with therapy Acute post op blood loss anemia  CM to assist with discharge  Follow up with [place] [facility] in 2 weeks for staple removal.   DVT Prophylaxis - Lovenox, Foot Pumps and TED hose Weight-Bearing as tolerated to right leg, NWB [place], PA-C [place] 08/31/2016, 8:20 AM

## 2016-08-31 NOTE — Progress Notes (Signed)
Clinical Education officer, museum (CSW) presented bed offers to patient and her family and they chose [facility]. Patient's sister Amanda Pea is aware of above. [facility] admissions coordinator at [facility] is aware of accepted bed offer. Plan is for patient to D/C to [facility] tomorrow pending medical clearance.   [employer], LCSW [phone]

## 2016-08-31 NOTE — Progress Notes (Signed)
Occupational Therapy Treatment Patient Details Name: Tanya Roy MRN: [id number] DOB: [birth year] Today's Date: 08/31/2016    History of present illness Pt is a pleasant 83 yo female, admitted to [facility] following a fall when taking out her trash, imaging showed a R distal radius fracture and R intertrochanteric hip fracture, s/p ORIF of R distal radius and R  femur (08/29/16), PMH includes Osteoporosis and arthritis.    OT comments  Pt seen for OT treatment this date. Session focused on LB ADL training with AE and UB ADL training. Pt required moderate verbal cues during ADL tasks for sequencing, technique, and to maintain R wrist precautions, as pt was at times forgetful and easily distracted. Pt required min assist for doff/donning socks using AE, mod assist for initiating dressing of pants over feet up to knees, and would require max assist for completing donning over hips once in standing, due to pt being unable to maintain standing without BUE support on walker. Pt progressing slowly towards functional goals and continues to be limited by pain, decreased strength/ROM/activity tolerance, as well as nausea at times when standing. Pt continues to benefit from skilled OT services to continue to address UB and LB dressing tasks with and without AE, education/training in ECS, and home/routines modifications to minimize falls risk in the home. Discharge plans and frequency remain appropriate.     Follow Up Recommendations  SNF    Equipment Recommendations  None recommended by OT    Recommendations for Other Services      Precautions / Restrictions Precautions Precautions: Fall Restrictions Weight Bearing Restrictions: Yes RUE Weight Bearing: Non weight bearing RLE Weight Bearing: Weight bearing as tolerated Other Position/Activity Restrictions: NWBing of the R wrist, WBAT R hip       Mobility Bed Mobility   General bed mobility comments: did not attempt, pt up in recliner for OT  session  Transfers Overall transfer level: Needs assistance Equipment used: Rolling walker (2 wheeled) Transfers: Sit to/from Stand Sit to Stand: Mod assist         General transfer comment: Increased time to perform, cueing for safety/sequencing/R wrist precautions and mod assist x1 required for standing with posterior lean    Balance Overall balance assessment: Needs assistance Sitting-balance support: Feet supported;Single extremity supported Sitting balance-Leahy Scale: Good Sitting balance - Comments: no dizziness or nausea reported in sitting for LB ADL tasks Postural control: Posterior lean;Left lateral lean Standing balance support: Bilateral upper extremity supported;During functional activity Standing balance-Leahy Scale: Poor Standing balance comment: min A to maintain standing posture for <1 minute, cueing for achieving more upright posture once in standing, unable to let go of walker to pull up pants during dressing tasks                   ADL Overall ADL's : Needs assistance/impaired Eating/Feeding: Sitting;Set up   Grooming: Sitting;Set up           Upper Body Dressing : Set up;Sitting;Minimal assistance;Cueing for safety Upper Body Dressing Details (indicate cue type and reason): Pt able to don stretchy sports bra overhead using 1 hand technique with occasional cueing for R wrist NWB'ing status and min assist to adjust back of bra Lower Body Dressing: Moderate assistance;Sitting/lateral leans;Sit to/from stand;Cueing for compensatory techniques;Cueing for safety;With adaptive equipment;Set up;Minimal assistance Lower Body Dressing Details (indicate cue type and reason): Pt educated in use of AE for LB ADL, pt able to demonstrate use of AE to doff/don socks with min assist and moderate  cueing for technique and occasional cueing for R wrist precautions; pt able to initiate donning of pants over feet up to knees using AE with mod assist and moderate cueing for  technique. Pt unable to complete donning pants over hips in standing due to poor balance, pain in RLE, and feeling slightly nausous so returned to sitting               General ADL Comments: pt continues to require moderate verbal cues for technique with AE and for NWB'ing status for R wrist, easily distracted when family member entered room      Vision                     Perception     [place] tested?: Within functional limits    Cognition   Behavior During Therapy: [facility] for tasks assessed/performed Overall Cognitive Status: Impaired/Different from baseline Area of Impairment: Following commands;Memory     Memory: Decreased recall of precautions  Following Commands: Follows one step commands consistently;Follows multi-step commands inconsistently       General Comments: frequent cueing for sequencing/techniques with AE and for maintaining R wrist NWB'ing status, easily forgetful, easily distracted when others are in the room      Exercises Other Exercises Other Exercises: Pt provided with ECS handout to review for strategies to support functional independence and minimize risk of fatigue and falls   Shoulder Instructions       General Comments      Pertinent Vitals/ Pain       Pain Assessment: Faces Faces Pain Scale: Hurts even more Pain Location: R hip  Pain Descriptors / Indicators: Aching;Grimacing Pain Intervention(s): Monitored during session;Limited activity within patient's tolerance;Premedicated before session;Repositioned  Home Living                                          Prior Functioning/Environment              Frequency  Min 2X/week        Progress Toward Goals  OT Goals(current goals can now be found in the care plan section)  Progress towards OT goals: Progressing toward goals  Acute Rehab OT Goals Patient Stated Goal: To get stronger and return home OT Goal Formulation: With  patient/family Time For Goal Achievement: 09/13/16 Potential to Achieve Goals: Good  Plan Discharge plan remains appropriate;Frequency remains appropriate    Co-evaluation                 End of Session Equipment Utilized During Treatment: Gait belt;Rolling walker  OT Visit Diagnosis: Other abnormalities of gait and mobility (R26.89);History of falling (Z91.81);Muscle weakness (generalized) (M62.81);Pain Pain - Right/Left: Right Pain - part of body: Hand;Hip   Activity Tolerance Patient limited by pain;Other (comment) (limited by slight nausea once in standing)   Patient Left in chair;with call bell/phone within reach;with chair alarm set;with SCD's reapplied   Nurse Communication          Time: 7001-7494 OT Time Calculation (min): 39 min  Charges: OT General Charges $OT Visit: 1 Procedure OT Treatments $Self Care/Home Management : 38-52 mins  Jeni Salles, MPH, MS, OTR/L ascom [phone] 08/31/16, 2:12 PM

## 2016-08-31 NOTE — Progress Notes (Signed)
Alert and oriented. Medicated for pain. Up to bedside commode once. VSS

## 2016-08-31 NOTE — Progress Notes (Signed)
[facility] [place] NAME: Tanya Roy    MR#:  [id number]  DATE OF BIRTH:  28-Aug-1928  Admitted for right hip fracture, right wrist fracture status post ORIF. Code 26 tomorrow, tolerating pain medicines, continue physical therapy.   CHIEF COMPLAINT:   Chief Complaint  Patient presents with  . Fall  . Hip Pain  . Wrist Pain    REVIEW OF SYSTEMS:   ROS CONSTITUTIONAL: No fever, fatigue or weakness.  EYES: No blurred or double vision.  EARS, NOSE, AND THROAT: No tinnitus or ear pain.  RESPIRATORY: No cough, shortness of breath, wheezing or hemoptysis.  CARDIOVASCULAR: No chest pain, orthopnea, edema.  GASTROINTESTINAL: No nausea, vomiting, diarrhea or abdominal pain.  GENITOURINARY: No dysuria, hematuria.  ENDOCRINE: No polyuria, nocturia,  HEMATOLOGY: No anemia, easy bruising or bleeding SKIN: No rash or lesion. MUSCULOSKELETAL:  Right hand coban band,right hip dressing. NEUROLOGIC: No tingling, numbness, weakness.  PSYCHIATRY: No anxiety or depression.   DRUG ALLERGIES:  No Known Allergies  VITALS:  Blood pressure (!) 147/51, pulse 93, temperature 97.2 F (36.2 C), temperature source Oral, resp. rate 14, height 5\' 6"  (1.676 m), weight 63.5 kg (140 lb), SpO2 97 %.  PHYSICAL EXAMINATION:  GENERAL:  83 y.o.-year-old patient lying in the bed with no acute distress.  EYES: Pupils equal, round, reactive to light and accommodation. No scleral icterus. Extraocular muscles intact.  HEENT: Head atraumatic, normocephalic. Oropharynx and nasopharynx clear.  NECK:  Supple, no jugular venous distention. No thyroid enlargement, no tenderness.  LUNGS: Normal breath sounds bilaterally, no wheezing, rales,rhonchi or crepitation. No use of accessory muscles of respiration.  CARDIOVASCULAR: S1, S2 normal. No murmurs, rubs, or gallops.  ABDOMEN: Soft, nontender, nondistended. Bowel sounds present. No organomegaly or mass.   EXTREMITIES: No pedal edema, cyanosis, or clubbing.  NEUROLOGIC: Cranial nerves II through XII are intact. Muscle strength 5/5 in all extremities. Sensation intact. Gait not checked.  PSYCHIATRIC: The patient is alert and oriented x 3.  SKIN: No obvious rash, lesion, or ulcer.  patient has dressing intact for right upper extremity, right lower extremity without swelling, drainage. Intact in ROM for fingers , foot and toes. on exam. Neurovascularly intact.  LABORATORY PANEL:   CBC  Recent Labs Lab 08/30/16 0413  WBC 12.5*  HGB 11.6*  HCT 34.4*  PLT 181   ------------------------------------------------------------------------------------------------------------------  Chemistries   Recent Labs Lab 08/30/16 0413  NA 137  K 4.4  CL 106  CO2 23  GLUCOSE 170*  BUN 22*  CREATININE 0.84  CALCIUM 8.1*   ------------------------------------------------------------------------------------------------------------------  Cardiac Enzymes No results for input(s): TROPONINI in the last 168 hours. ------------------------------------------------------------------------------------------------------------------  RADIOLOGY:  Dg Chest 1 View  Result Date: 08/29/2016 CLINICAL DATA:  Post fall, fell in yard while picking the trash can EXAM: CHEST 1 VIEW COMPARISON:  None. FINDINGS: Cardiomediastinal silhouette is unremarkable. Mild thoracic dextroscoliosis. Diffuse osteopenia. Probable chronic mild interstitial prominence bilaterally. No infiltrate or pulmonary edema. IMPRESSION: No active disease. Mild hyperinflation. Probable chronic mild interstitial prominence. Mild thoracic dextroscoliosis. Diffuse osteopenia. Electronically Signed   By: Lahoma Crocker M.D.   On: 08/29/2016 13:28   Dg Wrist 2 Views Right  Result Date: 08/29/2016 CLINICAL DATA:  Post op fracture EXAM: RIGHT WRIST - 2 VIEW COMPARISON:  08/29/2016 FINDINGS: Casting material obscures bone detail. Interval surgical plate and  multiple screw fixation of the distal radius across a distal radius fracture with restoration of anatomic alignment. Nondisplaced distal ulna fracture. IMPRESSION:  Interval surgical plate and screw fixation of distal radius fracture with restoration of alignment. No change in alignment of nondisplaced distal ulna fracture Electronically Signed   By: Donavan Foil M.D.   On: 08/29/2016 19:09   Dg Wrist Complete Right  Result Date: 08/29/2016 CLINICAL DATA:  Status post fall. EXAM: RIGHT WRIST - COMPLETE 3+ VIEW COMPARISON:  None. FINDINGS: Nondisplaced fracture of the distal radial metaphysis. Mild apex volar angulation with minimal dorsal displacement. Nondisplaced fracture of the distal ulnar metaphysis. No other fracture or dislocation. Generalized osteopenia. Soft tissue swelling along the volar aspect of the right wrist. IMPRESSION: 1. Nondisplaced fracture of the distal radial metaphysis. Mild apex volar angulation with minimal dorsal displacement. 2. Nondisplaced fracture of the distal ulnar metaphysis. Electronically Signed   By: Kathreen Devoid   On: 08/29/2016 13:22   Ct Head Wo Contrast  Result Date: 08/29/2016 CLINICAL DATA:  Fall in yard today.  Initial encounter. EXAM: CT HEAD WITHOUT CONTRAST CT CERVICAL SPINE WITHOUT CONTRAST TECHNIQUE: Multidetector CT imaging of the head and cervical spine was performed following the standard protocol without intravenous contrast. Multiplanar CT image reconstructions of the cervical spine were also generated. COMPARISON:  None. FINDINGS: CT HEAD FINDINGS Brain: No evidence of acute infarction, hemorrhage, hydrocephalus, extra-axial collection or mass lesion/mass effect. Vascular: No hyperdense vessel or unexpected calcification. Skull: Negative for fracture. Lucency in the high left parietal bone is likely hemangioma. Sinuses/Orbits: No acute finding. CT CERVICAL SPINE FINDINGS Alignment: No traumatic malalignment. Skull base and vertebrae: Negative for fracture  Soft tissues and spinal canal: No prevertebral fluid or swelling. No visible canal hematoma. Disc levels: Diffuse cervical spine degeneration. Multilevel facet spurring. C3-4 mild to moderate spinal stenosis. Upper chest: Negative IMPRESSION: No evidence of acute intracranial or cervical spine injury. Electronically Signed   By: Monte Fantasia M.D.   On: 08/29/2016 12:56   Ct Cervical Spine Wo Contrast  Result Date: 08/29/2016 CLINICAL DATA:  Fall in yard today.  Initial encounter. EXAM: CT HEAD WITHOUT CONTRAST CT CERVICAL SPINE WITHOUT CONTRAST TECHNIQUE: Multidetector CT imaging of the head and cervical spine was performed following the standard protocol without intravenous contrast. Multiplanar CT image reconstructions of the cervical spine were also generated. COMPARISON:  None. FINDINGS: CT HEAD FINDINGS Brain: No evidence of acute infarction, hemorrhage, hydrocephalus, extra-axial collection or mass lesion/mass effect. Vascular: No hyperdense vessel or unexpected calcification. Skull: Negative for fracture. Lucency in the high left parietal bone is likely hemangioma. Sinuses/Orbits: No acute finding. CT CERVICAL SPINE FINDINGS Alignment: No traumatic malalignment. Skull base and vertebrae: Negative for fracture Soft tissues and spinal canal: No prevertebral fluid or swelling. No visible canal hematoma. Disc levels: Diffuse cervical spine degeneration. Multilevel facet spurring. C3-4 mild to moderate spinal stenosis. Upper chest: Negative IMPRESSION: No evidence of acute intracranial or cervical spine injury. Electronically Signed   By: Monte Fantasia M.D.   On: 08/29/2016 12:56   Dg Hip Operative Unilat W Or W/o Pelvis Right  Result Date: 08/29/2016 CLINICAL DATA:  Right hip fracture EXAM: OPERATIVE RIGHT HIP WITH PELVIS COMPARISON:  08/29/2016 FLUOROSCOPY TIME:  Radiation Exposure Index (as provided by the fluoroscopic device): 20.8 mGy If the device does not provide the exposure index: Fluoroscopy  Time:  1 minutes 42 seconds Number of Acquired Images:  3 FINDINGS: Medullary rod is noted within the right femur with a compression screw traversing the femoral neck. Distal fixation screw is noted as well. The fracture fragments are in near anatomic alignment. IMPRESSION: Status post  ORIF of proximal right femoral fracture. Electronically Signed   By: Inez Catalina M.D.   On: 08/29/2016 17:14   Dg Hip Unilat W Or Wo Pelvis 2-3 Views Right  Result Date: 08/29/2016 CLINICAL DATA:  Fall. EXAM: DG HIP (WITH OR WITHOUT PELVIS) 2-3V RIGHT COMPARISON:  None. FINDINGS: There is an acute and comminuted intertrochanteric fracture involving the proximal right femur. Medial angulation of the distal fracture fragments noted. Chronic fracture deformity involving the left pubic bone identified. IMPRESSION: 1. Acute and comminuted intertrochanteric fracture affects the proximal right femur. Electronically Signed   By: Kerby Moors M.D.   On: 08/29/2016 13:25    EKG:   Orders placed or performed during the hospital encounter of 03/25/15  . EKG test  . EKG test    ASSESSMENT AND PLAN:  #1 right hip fracture, right wrist fracture status post ORIF for  Right wrist ,, intramedullary nail for the right hip. Patient will be seen by physical therapy, continue Lovenox, pain control.discharge to [facility] tomorrow. D/w patient  And her Niece/ #2. Acute postop blood loss anemia:  Hemoglobin  down from 13.9-11.6. No indicates and blood transfusion .  #3.Continue incentive spirometry. D/w family CM consult, All the records are reviewed and case discussed with Care Management/Social Workerr. Management plans discussed with the patient, family and they are in agreement.  CODE STATUS: full  TOTAL TIME TAKING CARE OF THIS PATIENT:35  minutes.   POSSIBLE D/C IN 1-2 DAYS, DEPENDING ON CLINICAL CONDITION.   Epifanio Lesches M.D on 08/31/2016 at 9:44 AM  Between 7am to 6pm - Pager - [phone]  After 6pm go to  [email or web address] - password [email or web address] [facility]  Office  [phone]  CC: Primary care physician; BABAOFF, Caryl Bis, MD   Note: This dictation was prepared with Dragon dictation along with smaller phrase technology. Any transcriptional errors that result from this process are unintentional.

## 2016-08-31 NOTE — Progress Notes (Signed)
Physical Therapy Treatment Patient Details Name: Tanya Roy MRN: [id number] DOB: [birth year] Today's Date: 08/31/2016    History of Present Illness Pt is a pleasant 83 yo female, admitted to [facility] following a fall when taking out her trash, imaging showed a R distal radius fracture and R intertrochanteric hip fracture, s/p ORIF of R distal radius and R  femur (08/29/16), PMH includes Osteoporosis and arthritis.     PT Comments    Pt willing to participate in PT treatment this afternoon with no new complaints. Pt's knowledge of precautions for NWBing in the R wrist is improving but still requires occasional reminders to not push weight through her R wrist. Pt able to ambulate 8' around her room w/ a chair follow, requires R platform RW for ambulation and min assist due to poor balance. Pt ambulated w/ increase pain during stance phase of the R Le, ambulated slowly with step to gait pattern. She was limited in activity tolerance secondary to pain and nausea that resolves after sitting for a minute. Pt is progressing but still displays decreased strength, and activity tolerance as well as increased pain that severely limit safe functional mobility. She will continue to benefit from skilled PT, recommend she transition to STR following acute hospital stay.    Follow Up Recommendations  SNF     Equipment Recommendations       Recommendations for Other Services       Precautions / Restrictions Precautions Precautions: Fall Restrictions Weight Bearing Restrictions: Yes RUE Weight Bearing: Non weight bearing RLE Weight Bearing: Weight bearing as tolerated Other Position/Activity Restrictions: NWBing of the R wrist, WBAT R hip    Mobility  Bed Mobility Overal bed mobility: Needs Assistance Bed Mobility: Sit to Supine     Supine to sit: Min assist;HOB elevated Sit to supine: Mod assist;+2 for physical assistance   General bed mobility comments: requires mod assist to lift R LE  back into bed and slide up to top of the bed   Transfers Overall transfer level: Needs assistance Equipment used: Rolling walker (2 wheeled) Transfers: Sit to/from Stand Sit to Stand: Mod assist         General transfer comment: Increased time to perform, cueing for safety/sequencing/R wrist precautions and mod assist required for standing with posterior lean  Ambulation/Gait Ambulation/Gait assistance: Min assist;+2 safety/equipment Ambulation Distance (Feet): 10 Feet Assistive device: Right platform walker Gait Pattern/deviations: Step-to pattern;Decreased stance time - right;Decreased step length - left;Decreased stride length;Shuffle;Antalgic;Narrow base of support   Gait velocity interpretation: <1.8 ft/sec, indicative of risk for recurrent falls General Gait Details: pt ambulated in room w/ chair follow for safety, increase pain w/ WBing in R LE that decreases R stance and L step length, R LE remained slightly externally rotated throughout w/ cuing to correct and ambulate w/ wider BOS, frequent cuing to promote stepping of the L LE   Stairs            Wheelchair Mobility    Modified Rankin (Stroke Patients Only)       Balance Overall balance assessment: Needs assistance Sitting-balance support: Feet supported;Single extremity supported Sitting balance-Leahy Scale: Good Sitting balance - Comments: no dizziness or nausea reported in sitting, able to maintian sitting posture w/o back support Postural control: Posterior lean Standing balance support: Bilateral upper extremity supported;During functional activity Standing balance-Leahy Scale: Poor Standing balance comment: Min A to maintain standing posture, has decreased standing tolerance secondary to nausea, able to ambulate with min assist  Cognition Arousal/Alertness: Awake/alert Behavior During Therapy: WFL for tasks assessed/performed Overall Cognitive Status: Impaired/Different  from baseline Area of Impairment: Following commands;Memory     Memory: Decreased recall of precautions Following Commands: Follows one step commands consistently;Follows multi-step commands inconsistently       General Comments: frequent cuing for maintaining NWBing precautions in R wrist, demonstrated some learning from this morning with hand placement during transfers     Exercises Other Exercises Other Exercises: Pt provided with ECS handout to review for strategies to support functional independence and minimize risk of fatigue and falls    General Comments        Pertinent Vitals/Pain Pain Assessment: Faces Faces Pain Scale: Hurts even more Pain Location: R hip  Pain Descriptors / Indicators: Aching;Grimacing Pain Intervention(s): Monitored during session;Premedicated before session;Repositioned;Limited activity within patient's tolerance    Home Living                      Prior Function            PT Goals (current goals can now be found in the care plan section) Acute Rehab PT Goals Patient Stated Goal: To get stronger and return home PT Goal Formulation: With patient Time For Goal Achievement: 09/13/16 Potential to Achieve Goals: Fair Progress towards PT goals: Progressing toward goals    Frequency    BID      PT Plan Current plan remains appropriate    Co-evaluation             End of Session Equipment Utilized During Treatment: Gait belt Activity Tolerance: Treatment limited secondary to agitation;Patient limited by pain;Other (comment) (nausea) Patient left: in bed;with call bell/phone within reach;with family/visitor present;with bed alarm set;with SCD's reapplied Nurse Communication: Mobility status PT Visit Diagnosis: Unsteadiness on feet (R26.81);Muscle weakness (generalized) (M62.81);History of falling (Z91.81);Difficulty in walking, not elsewhere classified (R26.2);Pain Pain - Right/Left: Right Pain - part of body: Hip;Hand      Time: 9643-8381 PT Time Calculation (min) (ACUTE ONLY): 17 min  Charges:  $Therapeutic Exercise: 8-22 mins $Therapeutic Activity: 8-22 mins                    G Codes:       [employer] Student PT 08/31/2016, 2:29 PM

## 2016-09-01 MED ORDER — DOCUSATE SODIUM 100 MG PO CAPS: 100.0000 mg | ORAL_CAPSULE | Freq: Two times a day (BID) | ORAL | 0 refills | Status: DC | PRN

## 2016-09-01 MED ORDER — ENOXAPARIN SODIUM 40 MG/0.4ML ~~LOC~~ SOLN: 40.0000 mg | SUBCUTANEOUS | 0 refills | Status: DC

## 2016-09-01 MED ORDER — OXYCODONE HCL 5 MG PO TABS: 5.0000 mg | ORAL_TABLET | ORAL | 0 refills | Status: DC | PRN

## 2016-09-01 MED ORDER — BISACODYL 10 MG RE SUPP: 10.0000 mg | Freq: Every day | RECTAL | 0 refills | Status: DC | PRN

## 2016-09-01 MED ORDER — METHOCARBAMOL 500 MG PO TABS: 500.0000 mg | ORAL_TABLET | Freq: Four times a day (QID) | ORAL | 0 refills | Status: DC | PRN

## 2016-09-01 NOTE — Discharge Summary (Signed)
Tanya Roy, is a 83 y.o. female  DOB 1928/07/06  MRN [id number].  Admission date:  08/29/2016  Admitting Physician  Vaughan Basta, MD  Discharge Date:  09/01/2016   Primary MD  BABAOFF, Caryl Bis, MD  Recommendations for primary care physician for things to follow:  Follow-up with Dr. Hessie Knows on March 21. Follow-up with primary doctor in 1 week    Admission Diagnosis  Surgery, elective [Z41.9] Fall, initial encounter [W19.XXXA] Closed fracture of right hip, initial encounter ([place]) [S72.001A] Closed fracture of distal end of right radius, unspecified fracture morphology, initial encounter [S52.501A]   Discharge Diagnosis  Surgery, elective [Z41.9] Fall, initial encounter [W19.XXXA] Closed fracture of right hip, initial encounter ([place]) [S72.001A] Closed fracture of distal end of right radius, unspecified fracture morphology, initial encounter [S52.501A]   Active Problems:   Hip fracture [facility])      Past Medical History:  Diagnosis Date  . Abnormal mammogram of right breast 2012   Microcalcifications  . Arthritis   . Osteoporosis     Past Surgical History:  Procedure Laterality Date  . APPENDECTOMY    . BREAST BIOPSY Right 2013   stereo, neg  . FEMUR IM NAIL Right 08/29/2016   Procedure: INTRAMEDULLARY (IM) NAIL FEMORAL;  Surgeon: Hessie Knows, MD;  Location: [facility] ORS;  Service: Orthopedics;  Laterality: Right;  . MELANOMA EXCISION Right 03/26/2015   Procedure: MELANOMA EXCISION;  Surgeon: Clayburn Pert, MD;  Location: [facility] ORS;  Service: General;  Laterality: Right;  . ORIF WRIST FRACTURE Right 08/29/2016   Procedure: OPEN REDUCTION INTERNAL FIXATION (ORIF) WRIST FRACTURE;  Surgeon: Hessie Knows, MD;  Location: [facility] ORS;  Service: Orthopedics;  Laterality: Right;  . SKIN BIOPSY  03/11/2015   Back        History of present illness and  Hospital Course:     Kindly see H&P for history of present illness and admission details, please review complete Labs, Consult reports and Test reports for all details in brief  HPI  from the history and physical done on the day of admission 83 year old female patient admitted because of the fall when taking out of her  trash,  Found RIGHT distal radius fracture and right intertrochanteric hip fracture   Hospital Course  1.right hip fracture, right distal radius fracture status post ORIF. And tolerated the procedure well. Physical therapy recommended rehabilitation, is going to [address] today. Continue Lovenox for 2 weeks, continue pain medicine and oxycodone especially 30 minutes before physical therapy. And stool softeners as needed. Follow up with Dr. Jeanmarie Hubert March 21.     Discharge Condition: stable   Follow UP   Contact information for follow-up providers    MENZ,MICHAEL, MD. Schedule an appointment as soon as possible for a visit in 2 week(s).   Specialty:  Orthopedic Surgery Contact information: [place] [address] [phone]            Contact information for after-discharge care    Destination    [facility] Follow up.   Specialty:  [place] information: [address] [place] [phone]                    Discharge Instructions  and  Discharge Medications      Allergies as of 09/01/2016   No Known Allergies     Medication List    TAKE these medications   aspirin EC  81 MG tablet Take 81 mg by mouth daily.   aspirin 325 MG EC tablet Take 325 mg by mouth daily as needed for pain.   bisacodyl 10 MG suppository Commonly known as:  DULCOLAX Place 1 suppository (10 mg total) rectally daily as needed for moderate constipation.   docusate sodium 100 MG capsule Commonly known as:   COLACE Take 1 capsule (100 mg total) by mouth 2 (two) times daily as needed for mild constipation.   enoxaparin 40 MG/0.4ML injection Commonly known as:  LOVENOX Inject 0.4 mLs (40 mg total) into the skin daily.   methocarbamol 500 MG tablet Commonly known as:  ROBAXIN Take 1 tablet (500 mg total) by mouth every 6 (six) hours as needed for muscle spasms.   multivitamin tablet Take 1 tablet by mouth daily.   oxyCODONE 5 MG immediate release tablet Commonly known as:  Oxy IR/ROXICODONE Take 1-2 tablets (5-10 mg total) by mouth every 4 (four) hours as needed for breakthrough pain ((for MODERATE breakthrough pain)).   Vitamin D (Ergocalciferol) 50000 units Caps capsule Commonly known as:  DRISDOL Take 50,000 Units by mouth every 7 (seven) days.         Diet and Activity recommendation: See Discharge Instructions above   Consults obtained -ortho   Major procedures and Radiology Reports - PLEASE review detailed and final reports for all details, in brief -      Dg Chest 1 View  Result Date: 08/29/2016 CLINICAL DATA:  Post fall, fell in yard while picking the trash can EXAM: CHEST 1 VIEW COMPARISON:  None. FINDINGS: Cardiomediastinal silhouette is unremarkable. Mild thoracic dextroscoliosis. Diffuse osteopenia. Probable chronic mild interstitial prominence bilaterally. No infiltrate or pulmonary edema. IMPRESSION: No active disease. Mild hyperinflation. Probable chronic mild interstitial prominence. Mild thoracic dextroscoliosis. Diffuse osteopenia. Electronically Signed   By: Lahoma Crocker M.D.   On: 08/29/2016 13:28   Dg Wrist 2 Views Right  Result Date: 08/29/2016 CLINICAL DATA:  Post op fracture EXAM: RIGHT WRIST - 2 VIEW COMPARISON:  08/29/2016 FINDINGS: Casting material obscures bone detail. Interval surgical plate and multiple screw fixation of the distal radius across a distal radius fracture with restoration of anatomic alignment. Nondisplaced distal ulna fracture.  IMPRESSION: Interval surgical plate and screw fixation of distal radius fracture with restoration of alignment. No change in alignment of nondisplaced distal ulna fracture Electronically Signed   By: Donavan Foil M.D.   On: 08/29/2016 19:09   Dg Wrist Complete Right  Result Date: 08/29/2016 CLINICAL DATA:  Status post fall. EXAM: RIGHT WRIST - COMPLETE 3+ VIEW COMPARISON:  None. FINDINGS: Nondisplaced fracture of the distal radial metaphysis. Mild apex volar angulation with minimal dorsal displacement. Nondisplaced fracture of the distal ulnar metaphysis. No other fracture or dislocation. Generalized osteopenia. Soft tissue swelling along the volar aspect of the right wrist. IMPRESSION: 1. Nondisplaced fracture of the distal radial metaphysis. Mild apex volar angulation with minimal dorsal displacement. 2. Nondisplaced fracture of the distal ulnar metaphysis. Electronically Signed   By: Kathreen Devoid   On: 08/29/2016 13:22   Ct Head Wo Contrast  Result Date: 08/29/2016 CLINICAL DATA:  Fall in yard today.  Initial encounter. EXAM: CT HEAD WITHOUT CONTRAST CT CERVICAL SPINE WITHOUT CONTRAST TECHNIQUE: Multidetector CT imaging of the head and cervical spine was performed following the standard protocol without intravenous contrast. Multiplanar CT image reconstructions of the cervical spine were also generated. COMPARISON:  None. FINDINGS: CT HEAD FINDINGS Brain: No evidence of acute infarction, hemorrhage, hydrocephalus, extra-axial collection  or mass lesion/mass effect. Vascular: No hyperdense vessel or unexpected calcification. Skull: Negative for fracture. Lucency in the high left parietal bone is likely hemangioma. Sinuses/Orbits: No acute finding. CT CERVICAL SPINE FINDINGS Alignment: No traumatic malalignment. Skull base and vertebrae: Negative for fracture Soft tissues and spinal canal: No prevertebral fluid or swelling. No visible canal hematoma. Disc levels: Diffuse cervical spine degeneration.  Multilevel facet spurring. C3-4 mild to moderate spinal stenosis. Upper chest: Negative IMPRESSION: No evidence of acute intracranial or cervical spine injury. Electronically Signed   By: Monte Fantasia M.D.   On: 08/29/2016 12:56   Ct Cervical Spine Wo Contrast  Result Date: 08/29/2016 CLINICAL DATA:  Fall in yard today.  Initial encounter. EXAM: CT HEAD WITHOUT CONTRAST CT CERVICAL SPINE WITHOUT CONTRAST TECHNIQUE: Multidetector CT imaging of the head and cervical spine was performed following the standard protocol without intravenous contrast. Multiplanar CT image reconstructions of the cervical spine were also generated. COMPARISON:  None. FINDINGS: CT HEAD FINDINGS Brain: No evidence of acute infarction, hemorrhage, hydrocephalus, extra-axial collection or mass lesion/mass effect. Vascular: No hyperdense vessel or unexpected calcification. Skull: Negative for fracture. Lucency in the high left parietal bone is likely hemangioma. Sinuses/Orbits: No acute finding. CT CERVICAL SPINE FINDINGS Alignment: No traumatic malalignment. Skull base and vertebrae: Negative for fracture Soft tissues and spinal canal: No prevertebral fluid or swelling. No visible canal hematoma. Disc levels: Diffuse cervical spine degeneration. Multilevel facet spurring. C3-4 mild to moderate spinal stenosis. Upper chest: Negative IMPRESSION: No evidence of acute intracranial or cervical spine injury. Electronically Signed   By: Monte Fantasia M.D.   On: 08/29/2016 12:56   Dg Hip Operative Unilat W Or W/o Pelvis Right  Result Date: 08/29/2016 CLINICAL DATA:  Right hip fracture EXAM: OPERATIVE RIGHT HIP WITH PELVIS COMPARISON:  08/29/2016 FLUOROSCOPY TIME:  Radiation Exposure Index (as provided by the fluoroscopic device): 20.8 mGy If the device does not provide the exposure index: Fluoroscopy Time:  1 minutes 42 seconds Number of Acquired Images:  3 FINDINGS: Medullary rod is noted within the right femur with a compression screw  traversing the femoral neck. Distal fixation screw is noted as well. The fracture fragments are in near anatomic alignment. IMPRESSION: Status post ORIF of proximal right femoral fracture. Electronically Signed   By: Inez Catalina M.D.   On: 08/29/2016 17:14   Dg Hip Unilat W Or Wo Pelvis 2-3 Views Right  Result Date: 08/29/2016 CLINICAL DATA:  Fall. EXAM: DG HIP (WITH OR WITHOUT PELVIS) 2-3V RIGHT COMPARISON:  None. FINDINGS: There is an acute and comminuted intertrochanteric fracture involving the proximal right femur. Medial angulation of the distal fracture fragments noted. Chronic fracture deformity involving the left pubic bone identified. IMPRESSION: 1. Acute and comminuted intertrochanteric fracture affects the proximal right femur. Electronically Signed   By: Kerby Moors M.D.   On: 08/29/2016 13:25    Micro Results     No results found for this or any previous visit (from the past 240 hour(s)).     Today   Subjective:   Tanya Roy todayNo chest pain or shortness of breath, pain well-controlled for the right hip and right arm. Stable for discharge, Objective:   Blood pressure (!) 155/81, pulse 91, temperature 97.9 F (36.6 C), temperature source Oral, resp. rate 18, height 5\' 6"  (1.676 m), weight 63.5 kg (140 lb), SpO2 95 %.   Intake/Output Summary (Last 24 hours) at 09/01/16 1046 Last data filed at 09/01/16 1004  Gross per 24 hour  Intake  960 ml  Output              200 ml  Net              760 ml    Exam Awake Alert, Oriented x 3, No new F.N deficits, Normal affect Pease.AT,PERRAL Supple Neck,No JVD, No cervical lymphadenopathy appriciated.  Symmetrical Chest wall movement, Good air movement bilaterally, CTAB RRR,No Gallops,Rubs or new Murmurs, No Parasternal Heave +ve B.Sounds, Abd Soft, Non tender, No organomegaly appriciated, No rebound -guarding or rigidity. No Cyanosis, Clubbing or edema, No new Rash or bruise Dressing  present for right  arm, Data Review   CBC w Diff: Lab Results  Component Value Date   WBC 12.5 (H) 08/30/2016   HGB 11.6 (L) 08/30/2016   HCT 34.4 (L) 08/30/2016   PLT 181 08/30/2016   LYMPHOPCT 19 03/25/2015   MONOPCT 9 03/25/2015   EOSPCT 2 03/25/2015   BASOPCT 1 03/25/2015    CMP: Lab Results  Component Value Date   NA 137 08/30/2016   K 4.4 08/30/2016   CL 106 08/30/2016   CO2 23 08/30/2016   BUN 22 (H) 08/30/2016   CREATININE 0.84 08/30/2016  .   Total Time in preparing paper work, data evaluation and todays exam - 7 minutes  Tanya Roy M.D on 09/01/2016 at 10:46 AM    Note: This dictation was prepared with Dragon dictation along with smaller phrase technology. Any transcriptional errors that result from this process are unintentional.

## 2016-09-01 NOTE — Discharge Instructions (Signed)
Diet: As you were doing prior to hospitalization   Shower:  May shower but keep the wounds dry, use an occlusive plastic wrap, NO SOAKING IN TUB.  If the bandage gets wet, change with a clean dry gauze.  Dressing:  You may change your dressing as needed. Change the dressing with sterile gauze dressing.    Activity:  Increase activity slowly as tolerated, but follow the weight bearing instructions below.  No lifting or driving for 6 weeks.  Weight Bearing:   Weight bearing as tolerated to right lower extremity. Non weight bearing right upper extremity.  To prevent constipation: you may use a stool softener such as -  Colace (over the counter) 100 mg by mouth twice a day  Drink plenty of fluids (prune juice may be helpful) and high fiber foods Miralax (over the counter) for constipation as needed.    Itching:  If you experience itching with your medications, try taking only a single pain pill, or even half a pain pill at a time.  You may take up to 10 pain pills per day, and you can also use benadryl over the counter for itching or also to help with sleep.   Precautions:  If you experience chest pain or shortness of breath - call 911 immediately for transfer to the hospital emergency department!!  If you develop a fever greater that 101 F, purulent drainage from wound, increased redness or drainage from wound, or calf pain-Call [facility]                                              Follow- Up Appointment:  Please call for an appointment to be seen in 2 weeks at [facility]

## 2016-09-01 NOTE — Progress Notes (Signed)
4 attempts to call report to [facility].

## 2016-09-01 NOTE — Progress Notes (Signed)
Patient is medically stable for D/C to [facility] today. Per Seth Bake admissions coordinator at [facility] patient will go to room 314. RN will call report at [phone] and arrange EMS for transport. Clinical Education officer, museum (CSW) sent D/C orders to [facility] today. Patient is aware of above. Patient's niece Jackelyn Poling is at bedside aware of D/C today. CSW also left a voicemail for patient's sister [name] and made her aware of above. Please reconsult if future social work needs arise. CSW signing off.   [employer], LCSW [phone]

## 2016-09-01 NOTE — Clinical Social Work Placement (Signed)
   CLINICAL SOCIAL WORK PLACEMENT  NOTE  Date:  09/01/2016  Patient Details  Name: Tanya Roy MRN: [id number] Date of Birth: 1929/02/16  Clinical Social Work is seeking post-discharge placement for this patient at the [place] level of care (*CSW will initial, date and re-position this form in  chart as items are completed):  Yes   Patient/family provided with [place] Work Department's list of facilities offering this level of care within the geographic area requested by the patient (or if unable, by the patient's family).  Yes   Patient/family informed of their freedom to choose among providers that offer the needed level of care, that participate in Medicare, Medicaid or managed care program needed by the patient, have an available bed and are willing to accept the patient.  Yes   Patient/family informed of [facility]'s ownership interest in [facility] and [facility], as well as of the fact that they are under no obligation to receive care at these facilities.  PASRR submitted to EDS on 08/30/16     PASRR number received on 08/30/16     Existing PASRR number confirmed on       FL2 transmitted to all facilities in geographic area requested by pt/family on 08/30/16     FL2 transmitted to all facilities within larger geographic area on       Patient informed that his/her managed care company has contracts with or will negotiate with certain facilities, including the following:        Yes   Patient/family informed of bed offers received.  Patient chooses bed at  [facility] )     Physician recommends and patient chooses bed at      Patient to be transferred to  [facility] ) on 09/01/16.  Patient to be transferred to facility by  [facility] )     Patient family notified on 09/01/16 of transfer.  Name of family member notified:   (Patient's niece Tanya Roy is at bedside and aware of D/C today. )     PHYSICIAN        Additional Comment:    _______________________________________________ Raevyn Sokol, Veronia Beets, LCSW 09/01/2016, 11:28 AM

## 2016-09-01 NOTE — Progress Notes (Signed)
Patient continues to improve.  Wounds healing well, no drainage. Will need follow up with me 3/21.

## 2016-09-01 NOTE — Progress Notes (Signed)
Report called to Allene Pyo at [facility]. EMS called for transport.

## 2016-09-01 NOTE — Progress Notes (Signed)
Physical Therapy Treatment Patient Details Name: Tanya Roy MRN: [id number] DOB: 02/20/1929 Today's Date: 09/01/2016    History of Present Illness Pt is a pleasant 83 yo female, admitted to [facility] following a fall when taking out her trash, imaging showed a R distal radius fracture and R intertrochanteric hip fracture, s/p ORIF of R distal radius and R  femur (08/29/16), PMH includes Osteoporosis and arthritis.     PT Comments    Pt awake and willing to participate in PT treatment, stated pain at rest was 2/10. Pt is improving in mobility and able to move to sitting to EOB w/ increased time and cuing, still requires min to mod assist when transferring to standing. Displayed improved activity tolerance and able to ambulate to nursing station initially using a R platform RW but switched to a bilat platform RW and displayed improved gait speed and stability, requires min assist and chair follow during ambulation for safety, also needed a short sitting rest break due to feeling nauseated that resolved. Pt limited in ambulation due to increased pain w/ weight bearing through R LE and nausea. She demonstrated improved safety awareness for hand placement during transfers but still requires occasional reminders for maintaining NWBing in R wrist. Pt is progressing but still limited in safe mobility due to deceased strength, activity tolerance and increased pain. Pt will continue to benefit from skilled Pt, recommend SNF following acute hospital stay.    Follow Up Recommendations  SNF     Equipment Recommendations    Bilateral Platform RW   Recommendations for Other Services       Precautions / Restrictions Precautions Precautions: Fall Restrictions Weight Bearing Restrictions: Yes RUE Weight Bearing: Weight bearing as tolerated RLE Weight Bearing: Weight bearing as tolerated Other Position/Activity Restrictions: NWBing of the R wrist, WBAT R hip    Mobility  Bed Mobility Overal bed  mobility: Needs Assistance Bed Mobility: Supine to Sit     Supine to sit: HOB elevated;Min guard     General bed mobility comments: able to move to sitting w/ modified independence requires increased time and frequent cuing   Transfers Overall transfer level: Needs assistance Equipment used: Rolling walker (2 wheeled) Transfers: Sit to/from Stand Sit to Stand: Mod assist         General transfer comment: Increased time to perform, cueing to maintain R wrist precautions and re-educated on L hand placement- demonstrates increased pain and time to move to standing, leans posteriorly   Ambulation/Gait Ambulation/Gait assistance: Min assist;+2 safety/equipment Ambulation Distance (Feet): 40 Feet Assistive device: Right platform walker;Bilateral platform walker Gait Pattern/deviations: Step-to pattern;Decreased step length - left;Decreased stance time - right;Shuffle;Narrow base of support;Antalgic   Gait velocity interpretation: <1.8 ft/sec, indicative of risk for recurrent falls General Gait Details: Pt displayed improved tolerance during ambulation, still painfule WBing in R LE and often lifts is off the floor, requires cuing to keep R LE on floor when advancing L LE, able to progress gait speed with verbal cues, required chair follow for safety and took one seated rest break, pt initially ambulated w/ R platform RW but switched to bilateral platform RW and she demonstrated improved tolerance and safety   Stairs            Wheelchair Mobility    Modified Rankin (Stroke Patients Only)       Balance Overall balance assessment: Needs assistance Sitting-balance support: Feet supported;Single extremity supported Sitting balance-Leahy Scale: Good Sitting balance - Comments: no dizziness or nausea reported in  sitting, able to maintian sitting posture w/o back support Postural control: Posterior lean Standing balance support: Bilateral upper extremity supported Standing  balance-Leahy Scale: Fair Standing balance comment: Able to maintain upright posture w/ min A and use of platform RW, R knee remains slightly flexed and R hip more externally rotated secondary to pain                    Cognition Arousal/Alertness: Awake/alert Behavior During Therapy: WFL for tasks assessed/performed Overall Cognitive Status: Impaired/Different from baseline Area of Impairment: Following commands;Memory     Memory: Decreased recall of precautions Following Commands: Follows one step commands consistently;Follows multi-step commands inconsistently       General Comments: is becoming more aware of NWBing precautions for R wrist, but still requires occasional reminders to abide by precautions, has delayed reponse recalling events that occurred early this morning      Exercises General Exercises - Lower Extremity Short Arc Quad: AROM;Strengthening;Right;10 reps;Supine Long Arc Quad: AROM;Strengthening;Right;10 reps;Seated Heel Slides: AROM;Strengthening;Right;10 reps;Supine Hip ABduction/ADduction: AROM;Strengthening;Right;10 reps;Supine Straight Leg Raises: AROM;Strengthening;Right;10 reps;Supine;Limitations (min assist to achieve full ROM)    General Comments        Pertinent Vitals/Pain Pain Assessment: Faces Faces Pain Scale: Hurts even more Pain Location: R hip, is a 2 at rest pain increased during WBing of the R LE Pain Descriptors / Indicators: Aching;Grimacing Pain Intervention(s): Monitored during session;Premedicated before session;Limited activity within patient's tolerance    Home Living                      Prior Function            PT Goals (current goals can now be found in the care plan section) Acute Rehab PT Goals Patient Stated Goal: To get stronger and return home PT Goal Formulation: With patient Time For Goal Achievement: 09/13/16 Potential to Achieve Goals: Fair Progress towards PT goals: Progressing toward goals     Frequency    BID      PT Plan Current plan remains appropriate    Co-evaluation             End of Session Equipment Utilized During Treatment: Gait belt Activity Tolerance: Patient limited by fatigue;Patient limited by pain Patient left: in chair;with call bell/phone within reach;with chair alarm set;with SCD's reapplied;with nursing/sitter in room Nurse Communication: Mobility status PT Visit Diagnosis: Unsteadiness on feet (R26.81);Muscle weakness (generalized) (M62.81);History of falling (Z91.81);Difficulty in walking, not elsewhere classified (R26.2);Pain Pain - Right/Left: Right Pain - part of body: Hip;Hand     Time: 5038-8828 PT Time Calculation (min) (ACUTE ONLY): 30 min  Charges:                       G Codes:       [employer] Student PT 09/01/2016, 10:12 AM

## 2016-09-05 DIAGNOSIS — M810 Age-related osteoporosis without current pathological fracture: Secondary | ICD-10-CM | POA: Diagnosis not present

## 2016-09-05 DIAGNOSIS — R030 Elevated blood-pressure reading, without diagnosis of hypertension: Secondary | ICD-10-CM | POA: Diagnosis not present

## 2016-09-05 DIAGNOSIS — S52501A Unspecified fracture of the lower end of right radius, initial encounter for closed fracture: Secondary | ICD-10-CM

## 2016-09-05 DIAGNOSIS — S72011A Unspecified intracapsular fracture of right femur, initial encounter for closed fracture: Secondary | ICD-10-CM

## 2016-09-08 DIAGNOSIS — L309 Dermatitis, unspecified: Secondary | ICD-10-CM | POA: Diagnosis not present

## 2016-09-11 DIAGNOSIS — L239 Allergic contact dermatitis, unspecified cause: Secondary | ICD-10-CM | POA: Diagnosis not present

## 2016-09-25 DIAGNOSIS — J3489 Other specified disorders of nose and nasal sinuses: Secondary | ICD-10-CM

## 2017-05-22 ENCOUNTER — Other Ambulatory Visit: Payer: Self-pay | Admitting: Family Medicine

## 2017-05-22 DIAGNOSIS — Z1231 Encounter for screening mammogram for malignant neoplasm of breast: Secondary | ICD-10-CM

## 2017-06-12 ENCOUNTER — Ambulatory Visit
Admission: RE | Admit: 2017-06-12 | Discharge: 2017-06-12 | Disposition: A | Discharge status: Home / Self Care | Payer: Medicare Other | Source: Ambulatory Visit | Attending: Family Medicine | Admitting: Family Medicine

## 2017-06-12 DIAGNOSIS — Z1231 Encounter for screening mammogram for malignant neoplasm of breast: Secondary | ICD-10-CM | POA: Diagnosis present

## 2021-02-14 ENCOUNTER — Encounter
Admission: EM | Disposition: A | Discharge status: Home / Self Care | Payer: Self-pay | Source: Home / Self Care | Attending: Internal Medicine

## 2021-02-14 ENCOUNTER — Inpatient Hospital Stay
Admission: EM | Admit: 2021-02-14 | Discharge: 2021-02-17 | DRG: 246 | Disposition: A | Discharge status: Home / Self Care | Payer: Medicare Other | Attending: Internal Medicine | Admitting: Internal Medicine

## 2021-02-14 ENCOUNTER — Emergency Department: Payer: Medicare Other

## 2021-02-14 ENCOUNTER — Other Ambulatory Visit: Payer: Self-pay

## 2021-02-14 DIAGNOSIS — I5021 Acute systolic (congestive) heart failure: Secondary | ICD-10-CM | POA: Diagnosis not present

## 2021-02-14 DIAGNOSIS — Z20822 Contact with and (suspected) exposure to covid-19: Secondary | ICD-10-CM | POA: Diagnosis present

## 2021-02-14 DIAGNOSIS — E782 Mixed hyperlipidemia: Secondary | ICD-10-CM | POA: Diagnosis not present

## 2021-02-14 DIAGNOSIS — I2109 ST elevation (STEMI) myocardial infarction involving other coronary artery of anterior wall: Principal | ICD-10-CM | POA: Diagnosis present

## 2021-02-14 DIAGNOSIS — M1990 Unspecified osteoarthritis, unspecified site: Secondary | ICD-10-CM | POA: Diagnosis present

## 2021-02-14 DIAGNOSIS — I2102 ST elevation (STEMI) myocardial infarction involving left anterior descending coronary artery: Secondary | ICD-10-CM

## 2021-02-14 DIAGNOSIS — Z8249 Family history of ischemic heart disease and other diseases of the circulatory system: Secondary | ICD-10-CM | POA: Diagnosis not present

## 2021-02-14 DIAGNOSIS — R410 Disorientation, unspecified: Secondary | ICD-10-CM | POA: Diagnosis not present

## 2021-02-14 DIAGNOSIS — I213 ST elevation (STEMI) myocardial infarction of unspecified site: Secondary | ICD-10-CM | POA: Diagnosis present

## 2021-02-14 DIAGNOSIS — R4189 Other symptoms and signs involving cognitive functions and awareness: Secondary | ICD-10-CM | POA: Diagnosis present

## 2021-02-14 DIAGNOSIS — Z7982 Long term (current) use of aspirin: Secondary | ICD-10-CM | POA: Diagnosis not present

## 2021-02-14 DIAGNOSIS — F05 Delirium due to known physiological condition: Secondary | ICD-10-CM | POA: Diagnosis not present

## 2021-02-14 DIAGNOSIS — I2510 Atherosclerotic heart disease of native coronary artery without angina pectoris: Secondary | ICD-10-CM | POA: Diagnosis present

## 2021-02-14 DIAGNOSIS — M810 Age-related osteoporosis without current pathological fracture: Secondary | ICD-10-CM | POA: Diagnosis present

## 2021-02-14 DIAGNOSIS — E876 Hypokalemia: Secondary | ICD-10-CM | POA: Diagnosis present

## 2021-02-14 DIAGNOSIS — Z85820 Personal history of malignant melanoma of skin: Secondary | ICD-10-CM | POA: Diagnosis not present

## 2021-02-14 DIAGNOSIS — G4700 Insomnia, unspecified: Secondary | ICD-10-CM | POA: Diagnosis present

## 2021-02-14 DIAGNOSIS — I420 Dilated cardiomyopathy: Secondary | ICD-10-CM

## 2021-02-14 DIAGNOSIS — E785 Hyperlipidemia, unspecified: Secondary | ICD-10-CM

## 2021-02-14 DIAGNOSIS — E559 Vitamin D deficiency, unspecified: Secondary | ICD-10-CM | POA: Diagnosis present

## 2021-02-14 DIAGNOSIS — Z79899 Other long term (current) drug therapy: Secondary | ICD-10-CM

## 2021-02-14 DIAGNOSIS — I10 Essential (primary) hypertension: Secondary | ICD-10-CM

## 2021-02-14 DIAGNOSIS — I5023 Acute on chronic systolic (congestive) heart failure: Secondary | ICD-10-CM | POA: Diagnosis present

## 2021-02-14 DIAGNOSIS — I110 Hypertensive heart disease with heart failure: Secondary | ICD-10-CM | POA: Diagnosis present

## 2021-02-14 DIAGNOSIS — I219 Acute myocardial infarction, unspecified: Secondary | ICD-10-CM | POA: Diagnosis not present

## 2021-02-14 HISTORY — PX: LEFT HEART CATH AND CORONARY ANGIOGRAPHY: CATH118249

## 2021-02-14 HISTORY — DX: Disorientation, unspecified: R41.0

## 2021-02-14 HISTORY — PX: CORONARY/GRAFT ACUTE MI REVASCULARIZATION: CATH118305

## 2021-02-14 LAB — CBC WITH DIFFERENTIAL/PLATELET
Abs Immature Granulocytes: 0.09 10*3/uL — ABNORMAL HIGH (ref 0.00–0.07)
Basophils Absolute: 0.1 10*3/uL (ref 0.0–0.1)
Basophils Relative: 0 %
Eosinophils Absolute: 0.2 10*3/uL (ref 0.0–0.5)
Eosinophils Relative: 1 %
HCT: 41.2 % (ref 36.0–46.0)
Hemoglobin: 14 g/dL (ref 12.0–15.0)
Immature Granulocytes: 1 %
Lymphocytes Relative: 8 %
Lymphs Abs: 1.2 10*3/uL (ref 0.7–4.0)
MCH: 33.4 pg (ref 26.0–34.0)
MCHC: 34 g/dL (ref 30.0–36.0)
MCV: 98.3 fL (ref 80.0–100.0)
Monocytes Absolute: 0.8 10*3/uL (ref 0.1–1.0)
Monocytes Relative: 5 %
Neutro Abs: 12.7 10*3/uL — ABNORMAL HIGH (ref 1.7–7.7)
Neutrophils Relative %: 85 %
Platelets: 189 10*3/uL (ref 150–400)
RBC: 4.19 MIL/uL (ref 3.87–5.11)
RDW: 13.2 % (ref 11.5–15.5)
WBC: 14.9 10*3/uL — ABNORMAL HIGH (ref 4.0–10.5)
nRBC: 0 % (ref 0.0–0.2)

## 2021-02-14 LAB — COMPREHENSIVE METABOLIC PANEL
ALT: 21 U/L (ref 0–44)
AST: 76 U/L — ABNORMAL HIGH (ref 15–41)
Albumin: 3.8 g/dL (ref 3.5–5.0)
Alkaline Phosphatase: 67 U/L (ref 38–126)
Anion gap: 9 (ref 5–15)
BUN: 21 mg/dL (ref 8–23)
CO2: 24 mmol/L (ref 22–32)
Calcium: 8.7 mg/dL — ABNORMAL LOW (ref 8.9–10.3)
Chloride: 104 mmol/L (ref 98–111)
Creatinine, Ser: 0.91 mg/dL (ref 0.44–1.00)
GFR, Estimated: >60 mL/min (ref >60)
Glucose, Bld: 142 mg/dL — ABNORMAL HIGH (ref 70–99)
Potassium: 3.6 mmol/L (ref 3.5–5.1)
Sodium: 137 mmol/L (ref 135–145)
Total Bilirubin: 1 mg/dL (ref 0.3–1.2)
Total Protein: 6.1 g/dL — ABNORMAL LOW (ref 6.5–8.1)

## 2021-02-14 LAB — GLUCOSE, CAPILLARY: Glucose-Capillary: 136 mg/dL — ABNORMAL HIGH (ref 70–99)

## 2021-02-14 LAB — RESP PANEL BY RT-PCR (FLU A&B, COVID) ARPGX2
Influenza A by PCR: NEGATIVE
Influenza B by PCR: NEGATIVE
SARS Coronavirus 2 by RT PCR: NEGATIVE

## 2021-02-14 LAB — PROTIME-INR
INR: 1 (ref 0.8–1.2)
Prothrombin Time: 13.4 seconds (ref 11.4–15.2)

## 2021-02-14 LAB — APTT: aPTT: 25 seconds (ref 24–36)

## 2021-02-14 LAB — POCT ACTIVATED CLOTTING TIME
Activated Clotting Time: 242 seconds
Activated Clotting Time: 242 seconds

## 2021-02-14 LAB — TROPONIN I (HIGH SENSITIVITY)
Troponin I (High Sensitivity): 184 ng/L (ref <18)
Troponin I (High Sensitivity): 19929 ng/L (ref <18)

## 2021-02-14 SURGERY — CORONARY/GRAFT ACUTE MI REVASCULARIZATION
Anesthesia: Moderate Sedation

## 2021-02-14 MED ORDER — LIDOCAINE HCL 1 % IJ SOLN
INTRAMUSCULAR | Status: AC
  Filled 2021-02-14: qty 20

## 2021-02-14 MED ORDER — SODIUM CHLORIDE 0.9% FLUSH
3.0000 mL | Freq: Two times a day (BID) | INTRAVENOUS | Status: DC
  Administered 2021-02-14 – 2021-02-17 (×6): 3 mL via INTRAVENOUS

## 2021-02-14 MED ORDER — NITROGLYCERIN 1 MG/10 ML FOR IR/CATH LAB
INTRA_ARTERIAL | Status: DC | PRN
  Administered 2021-02-14: 200 ug via INTRACORONARY

## 2021-02-14 MED ORDER — ENOXAPARIN SODIUM 40 MG/0.4ML IJ SOSY
40.0000 mg | PREFILLED_SYRINGE | INTRAMUSCULAR | Status: DC
  Administered 2021-02-15 – 2021-02-17 (×2): 40 mg via SUBCUTANEOUS
  Filled 2021-02-14 (×3): qty 0.4

## 2021-02-14 MED ORDER — HEPARIN (PORCINE) IN NACL 1000-0.9 UT/500ML-% IV SOLN
INTRAVENOUS | Status: AC
  Filled 2021-02-14: qty 1000

## 2021-02-14 MED ORDER — HEPARIN SODIUM (PORCINE) 1000 UNIT/ML IJ SOLN
INTRAMUSCULAR | Status: DC | PRN
  Administered 2021-02-14 (×2): 2000 [IU] via INTRAVENOUS
  Administered 2021-02-14: 3000 [IU] via INTRAVENOUS
  Administered 2021-02-14: 2000 [IU] via INTRAVENOUS

## 2021-02-14 MED ORDER — CARVEDILOL 3.125 MG PO TABS
3.1250 mg | ORAL_TABLET | Freq: Two times a day (BID) | ORAL | Status: DC
  Administered 2021-02-14 – 2021-02-17 (×6): 3.125 mg via ORAL
  Filled 2021-02-14 (×7): qty 1

## 2021-02-14 MED ORDER — VERAPAMIL HCL 2.5 MG/ML IV SOLN
INTRAVENOUS | Status: AC
  Filled 2021-02-14: qty 2

## 2021-02-14 MED ORDER — HEPARIN SODIUM (PORCINE) 1000 UNIT/ML IJ SOLN
INTRAMUSCULAR | Status: AC
  Filled 2021-02-14: qty 1

## 2021-02-14 MED ORDER — ONDANSETRON HCL 4 MG/2ML IJ SOLN: 4.0000 mg | INTRAMUSCULAR | Status: DC | PRN

## 2021-02-14 MED ORDER — ATORVASTATIN CALCIUM 20 MG PO TABS
80.0000 mg | ORAL_TABLET | Freq: Every day | ORAL | Status: DC
  Administered 2021-02-14 – 2021-02-16 (×3): 80 mg via ORAL
  Filled 2021-02-14 (×3): qty 4

## 2021-02-14 MED ORDER — TICAGRELOR 90 MG PO TABS
ORAL_TABLET | ORAL | Status: AC
  Filled 2021-02-14: qty 2

## 2021-02-14 MED ORDER — ADULT MULTIVITAMIN W/MINERALS CH
1.0000 | ORAL_TABLET | Freq: Every day | ORAL | Status: DC
  Administered 2021-02-15 – 2021-02-17 (×3): 1 via ORAL
  Filled 2021-02-14 (×3): qty 1

## 2021-02-14 MED ORDER — ASPIRIN EC 81 MG PO TBEC
81.0000 mg | DELAYED_RELEASE_TABLET | Freq: Every day | ORAL | Status: DC
  Administered 2021-02-15 – 2021-02-17 (×3): 81 mg via ORAL
  Filled 2021-02-14 (×3): qty 1

## 2021-02-14 MED ORDER — TICAGRELOR 90 MG PO TABS
ORAL_TABLET | ORAL | Status: DC | PRN
  Administered 2021-02-14: 180 mg via ORAL

## 2021-02-14 MED ORDER — MAGNESIUM HYDROXIDE 400 MG/5ML PO SUSP
30.0000 mL | Freq: Every day | ORAL | Status: DC | PRN
Start: 2021-02-14 — End: 2021-02-17

## 2021-02-14 MED ORDER — ACETAMINOPHEN 325 MG PO TABS: 650.0000 mg | ORAL_TABLET | ORAL | Status: DC | PRN

## 2021-02-14 MED ORDER — ONDANSETRON HCL 4 MG/2ML IJ SOLN: 4.0000 mg | Freq: Four times a day (QID) | INTRAMUSCULAR | Status: DC | PRN

## 2021-02-14 MED ORDER — IOHEXOL 300 MG/ML  SOLN
INTRAMUSCULAR | Status: DC | PRN
  Administered 2021-02-14: 109 mL

## 2021-02-14 MED ORDER — NITROGLYCERIN 0.4 MG SL SUBL
0.4000 mg | SUBLINGUAL_TABLET | SUBLINGUAL | Status: DC | PRN
Start: 2021-02-14 — End: 2021-02-17
  Administered 2021-02-14: 0.4 mg via SUBLINGUAL
  Filled 2021-02-14: qty 1

## 2021-02-14 MED ORDER — TRAZODONE HCL 50 MG PO TABS
25.0000 mg | ORAL_TABLET | Freq: Every evening | ORAL | Status: DC | PRN
  Administered 2021-02-16: 25 mg via ORAL
  Filled 2021-02-14: qty 1

## 2021-02-14 MED ORDER — SODIUM CHLORIDE 0.9% FLUSH: 3.0000 mL | INTRAVENOUS | Status: DC | PRN

## 2021-02-14 MED ORDER — SODIUM CHLORIDE 0.9 % IV SOLN: 250.0000 mL | INTRAVENOUS | Status: DC | PRN

## 2021-02-14 MED ORDER — BISACODYL 10 MG RE SUPP: 10.0000 mg | Freq: Every day | RECTAL | Status: DC | PRN

## 2021-02-14 MED ORDER — HEPARIN (PORCINE) IN NACL 2000-0.9 UNIT/L-% IV SOLN
INTRAVENOUS | Status: DC | PRN
  Administered 2021-02-14: 1000 mL

## 2021-02-14 MED ORDER — MIDAZOLAM HCL 2 MG/2ML IJ SOLN
INTRAMUSCULAR | Status: AC
  Filled 2021-02-14: qty 2

## 2021-02-14 MED ORDER — LIDOCAINE HCL (PF) 1 % IJ SOLN
INTRAMUSCULAR | Status: DC | PRN
  Administered 2021-02-14: 2 mL

## 2021-02-14 MED ORDER — FENTANYL CITRATE (PF) 100 MCG/2ML IJ SOLN
INTRAMUSCULAR | Status: AC
  Filled 2021-02-14: qty 2

## 2021-02-14 MED ORDER — DOCUSATE SODIUM 100 MG PO CAPS: 100.0000 mg | ORAL_CAPSULE | Freq: Two times a day (BID) | ORAL | Status: DC | PRN

## 2021-02-14 MED ORDER — TICAGRELOR 90 MG PO TABS
90.0000 mg | ORAL_TABLET | Freq: Two times a day (BID) | ORAL | Status: DC
  Administered 2021-02-14 – 2021-02-17 (×6): 90 mg via ORAL
  Filled 2021-02-14 (×6): qty 1

## 2021-02-14 MED ORDER — NITROGLYCERIN 1 MG/10 ML FOR IR/CATH LAB
INTRA_ARTERIAL | Status: AC
  Filled 2021-02-14: qty 10

## 2021-02-14 MED ORDER — HEPARIN SODIUM (PORCINE) 5000 UNIT/ML IJ SOLN: 60.0000 [IU]/kg | Freq: Once | INTRAMUSCULAR | Status: DC

## 2021-02-14 MED ORDER — VERAPAMIL HCL 2.5 MG/ML IV SOLN
INTRAVENOUS | Status: DC | PRN
  Administered 2021-02-14: 2.5 mg via INTRA_ARTERIAL

## 2021-02-14 SURGICAL SUPPLY — 21 items
BALLN TREK RX 2.25X12 (BALLOONS) ×2
BALLN ~~LOC~~ EUPHORA RX 3.25X15 (BALLOONS) ×2
BALLOON TREK RX 2.25X12 (BALLOONS) ×1 IMPLANT
BALLOON ~~LOC~~ EUPHORA RX 3.25X15 (BALLOONS) ×1 IMPLANT
CATH INFINITI JR4 5F (CATHETERS) ×2 IMPLANT
CATH LAUNCHER 6FR EBU3.5 (CATHETERS) ×2 IMPLANT
DEVICE RAD TR BAND REGULAR (VASCULAR PRODUCTS) ×2 IMPLANT
DRAPE BRACHIAL (DRAPES) ×2 IMPLANT
GLIDESHEATH SLEND SS 6F .021 (SHEATH) ×2 IMPLANT
GUIDEWIRE INQWIRE 1.5J.035X260 (WIRE) ×1 IMPLANT
INQWIRE 1.5J .035X260CM (WIRE) ×2
KIT ENCORE 26 ADVANTAGE (KITS) ×2 IMPLANT
PACK CARDIAC CATH (CUSTOM PROCEDURE TRAY) ×2 IMPLANT
PROTECTION STATION PRESSURIZED (MISCELLANEOUS) ×2
SET ATX SIMPLICITY (MISCELLANEOUS) ×2 IMPLANT
STATION PROTECTION PRESSURIZED (MISCELLANEOUS) ×1 IMPLANT
STENT ONYX FRONTIER 2.75X12 (Permanent Stent) ×2 IMPLANT
STENT ONYX FRONTIER 3.0X26 (Permanent Stent) ×2 IMPLANT
TUBING CIL FLEX 10 FLL-RA (TUBING) ×2 IMPLANT
VALVE COPILOT STAT (MISCELLANEOUS) ×2 IMPLANT
WIRE ASAHI PROWATER 180CM (WIRE) ×2 IMPLANT

## 2021-02-14 NOTE — Progress Notes (Signed)
   02/14/21 1405  Clinical Encounter Type  Visited With Family  Visit Type Initial;Spiritual support;Social support;Other (Comment) (cath lab)  Spiritual Encounters  Spiritual Needs Emotional  Chaplain Deloria Lair responded to a Code STEMI. Met Pt's sister, [name], in cath lab waiting area. Provided compassionate, non-anxious support; listened to to sister as she described her own memories of heart attack and receiving stint.

## 2021-02-14 NOTE — H&P (Signed)
[place]   PATIENT NAME: Tanya Roy    MR#:  [id number]  DATE OF BIRTH:  04/12/1929  DATE OF ADMISSION:  02/14/2021  PRIMARY CARE PHYSICIAN: Derinda Late, MD   Patient is coming from: Home  REQUESTING/REFERRING PHYSICIAN: Kathlyn Sacramento, MD  CHIEF COMPLAINT:   Chief Complaint  Patient presents with   Code STEMI    Pt comes from home with left sided chest pain onset 2 hours PTA with diaphoresis. Chest pain non-radiating and 7/10, some relief after 1 nitro spray and 324 ASA from EMS    HISTORY OF PRESENT ILLNESS:  Tanya Roy is a 87 y.o. female with medical history significant for osteoarthritis and osteoporosis, who presented to the emergency room with acute onset of midsternal chest pain graded 10/10 in severity and described as pressure and tightness with associated diaphoresis without nausea or vomiting.  It radiated to her neck.  Pain was associated with dyspnea.  No cough or wheezing or hemoptysis.  No leg pain or edema recent travels or surgeries.  Initial EKG showed anterior ST elevation with reciprocal ST segment depression in inferior leads and code STEMI was activated.  Here in the ED EKG showed lateral ST segment depression with T wave inversion inferiorly.  The patient received 1 sublingual nitroglycerin and aspirin and later time she arrived to the ED she reported improvement of her symptoms but was not resolved.  She denies any orthopnea or paroxysmal nocturnal dyspnea or worsening lower extremity edema.  No dysuria, oliguria or hematuria or flank pain.  No bleeding diathes.  She was started on IV heparin and was taken to the Cath Lab.  She had PCI and drug-eluting stent placement to the LAD by Dr. Fletcher Anon.  She will need staged PCI to the left circumflex and RCA which can be done as an outpatient.  Patient denies any chest pain or palpitations.  She is also symptomatic.  She is admitted to an ICU bed for further management.  ED Course: As above. EKG  as reviewed by me : As above Imaging: Chest x-ray showed stable cardiomegaly with mild bibasilar interstitial densities concerning for possible pulmonary edema. PAST MEDICAL HISTORY:   Past Medical History:  Diagnosis Date   Abnormal mammogram of right breast 2012   Microcalcifications   Arthritis    Osteoporosis     PAST SURGICAL HISTORY:   Past Surgical History:  Procedure Laterality Date   APPENDECTOMY     BREAST BIOPSY Right 2013   stereo, neg   FEMUR IM NAIL Right 08/29/2016   Procedure: INTRAMEDULLARY (IM) NAIL FEMORAL;  Surgeon: Hessie Knows, MD;  Location: [facility] ORS;  Service: Orthopedics;  Laterality: Right;   MELANOMA EXCISION Right 03/26/2015   Procedure: MELANOMA EXCISION;  Surgeon: Clayburn Pert, MD;  Location: [facility] ORS;  Service: General;  Laterality: Right;   ORIF WRIST FRACTURE Right 08/29/2016   Procedure: OPEN REDUCTION INTERNAL FIXATION (ORIF) WRIST FRACTURE;  Surgeon: Hessie Knows, MD;  Location: [facility] ORS;  Service: Orthopedics;  Laterality: Right;   SKIN BIOPSY  03/11/2015   Back    SOCIAL HISTORY:   Social History   Tobacco Use   Smoking status: Never   Smokeless tobacco: Never  Substance Use Topics   Alcohol use: No    Alcohol/week: 0.0 standard drinks    FAMILY HISTORY:   Family History  Problem Relation Age of Onset   Tuberculosis Mother    Heart disease Father    Breast cancer Neg Hx  DRUG ALLERGIES:  No Known Allergies  REVIEW OF SYSTEMS:   ROS As per history of present illness. All pertinent systems were reviewed above. Constitutional, HEENT, cardiovascular, respiratory, GI, GU, musculoskeletal, neuro, psychiatric, endocrine, integumentary and hematologic systems were reviewed and are otherwise negative/unremarkable except for positive findings mentioned above in the HPI.   MEDICATIONS AT HOME:   Prior to Admission medications   Medication Sig Start Date End Date Taking? Authorizing Provider  aspirin 325 MG EC tablet Take 325 mg  by mouth daily as needed for pain.    [provider]  aspirin EC 81 MG tablet Take 81 mg by mouth daily.    [provider]  bisacodyl (DULCOLAX) 10 MG suppository Place 1 suppository (10 mg total) rectally daily as needed for moderate constipation. 09/01/16   Epifanio Lesches, MD  docusate sodium (COLACE) 100 MG capsule Take 1 capsule (100 mg total) by mouth 2 (two) times daily as needed for mild constipation. 09/01/16   Epifanio Lesches, MD  enoxaparin (LOVENOX) 40 MG/0.4ML injection Inject 0.4 mLs (40 mg total) into the skin daily. 09/01/16 09/15/16  Duanne Guess, PA-C  methocarbamol (ROBAXIN) 500 MG tablet Take 1 tablet (500 mg total) by mouth every 6 (six) hours as needed for muscle spasms. 09/01/16   Epifanio Lesches, MD  Multiple Vitamin (MULTIVITAMIN) tablet Take 1 tablet by mouth daily.    [provider]  oxyCODONE (OXY IR/ROXICODONE) 5 MG immediate release tablet Take 1-2 tablets (5-10 mg total) by mouth every 4 (four) hours as needed for breakthrough pain ((for MODERATE breakthrough pain)). 09/01/16   Epifanio Lesches, MD  Vitamin D, Ergocalciferol, (DRISDOL) 50000 units CAPS capsule Take 50,000 Units by mouth every 7 (seven) days.    [provider]      VITAL SIGNS:  Blood pressure (!) 162/92, pulse 90, resp. rate 16, height '5\' 6"'$  (1.676 m), weight 63.5 kg, SpO2 95 %.  PHYSICAL EXAMINATION:  Physical Exam  GENERAL:  87 y.o.-year-old Caucasian female patient lying in the bed with no acute distress.  EYES: Pupils equal, round, reactive to light and accommodation. No scleral icterus. Extraocular muscles intact.  HEENT: Head atraumatic, normocephalic. Oropharynx and nasopharynx clear.  NECK:  Supple, no jugular venous distention. No thyroid enlargement, no tenderness.  LUNGS: Normal breath sounds bilaterally, no wheezing, rales,rhonchi or crepitation. No use of accessory muscles of respiration.  CARDIOVASCULAR: Regular rate and rhythm,  S1, S2 normal. No murmurs, rubs, or gallops.  ABDOMEN: Soft, nondistended, nontender. Bowel sounds present. No organomegaly or mass.  EXTREMITIES: No pedal edema, cyanosis, or clubbing.  NEUROLOGIC: Cranial nerves II through XII are intact. Muscle strength 5/5 in all extremities. Sensation intact. Gait not checked.  PSYCHIATRIC: The patient is alert and oriented x 3.  Normal affect and good eye contact. SKIN: No obvious rash, lesion, or ulcer.   LABORATORY PANEL:   CBC Recent Labs  Lab 02/14/21 1536  WBC 14.9*  HGB 14.0  HCT 41.2  PLT 189   ------------------------------------------------------------------------------------------------------------------  Chemistries  No results for input(s): NA, K, CL, CO2, GLUCOSE, BUN, CREATININE, CALCIUM, MG, AST, ALT, ALKPHOS, BILITOT in the last 168 hours.  Invalid input(s): GFRCGP ------------------------------------------------------------------------------------------------------------------  Cardiac Enzymes No results for input(s): TROPONINI in the last 168 hours. ------------------------------------------------------------------------------------------------------------------  RADIOLOGY:  CARDIAC CATHETERIZATION  Result Date: 02/14/2021   Mid LAD lesion is 100% stenosed.   Prox Cx lesion is 90% stenosed.   Prox RCA lesion is 85% stenosed.   A drug-eluting stent was successfully placed using  a STENT ONYX FRONTIER 3.0X26.   Post intervention, there is a 0% residual stenosis. 1.  Significant three-vessel coronary artery disease.  The culprit for anterior STEMI is an occluded mid LAD. 2.  Left ventricular angiography was not performed. 3.  Moderately to severely elevated left ventricular end-diastolic pressure at 30 mmHg. 4.  Successful angioplasty and drug-eluting stent placement to the mid LAD.  A second overlapped stent was used distally due to suspected distal edge dissection Recommendations: Dual antiplatelet therapy for at least 12  months. Aggressive treatment of risk factors. Obtain an echocardiogram to evaluate ejection fraction.  I started small dose carvedilol with plans to start an ACE inhibitor or ARB. Recommend staged PCI of the left circumflex and right coronary artery in few weeks.   DG Chest Portable 1 View  Result Date: 02/14/2021 CLINICAL DATA:  Chest pain. EXAM: PORTABLE CHEST 1 VIEW COMPARISON:  August 29, 2016. FINDINGS: Stable cardiomegaly. No pneumothorax is noted. Mild bibasilar interstitial densities are noted concerning for pulmonary edema. No definite pleural effusion is noted. Bony thorax is unremarkable. IMPRESSION: Stable cardiomegaly. Mild bibasilar interstitial densities are noted concerning for possible pulmonary edema. Electronically Signed   By: Marijo Conception M.D.   On: 02/14/2021 14:33      IMPRESSION AND PLAN:  Active Problems:   STEMI (ST elevation myocardial infarction) ([place])  1.  Acute ST segment elevation myocardial infarction status post PCI and LAD stent. - The patient is admitted to an ICU bed. - Pain management will be provided. - She will be continued on beta-blocker therapy with Coreg. - She will be on high-dose statin as well as aspirin and Brilinta.  2.  Essential hypertension. - We will continue her Zestoretic.  3.  Vitamin D deficiency. - We will continue vitamin D.   DVT prophylaxis: Lovenox. Code Status: full code. Family Communication:  The plan of care was discussed in details with the patient (and family). I answered all questions. The patient agreed to proceed with the above mentioned plan. Further management will depend upon hospital course. Disposition Plan: Back to previous home environment Consults called: Cardiology consult to Dr. Fletcher Anon All the records are reviewed and case discussed with ED provider.  Status is: Inpatient  Remains inpatient appropriate because:Ongoing active pain requiring inpatient pain management, Ongoing diagnostic testing needed not  appropriate for outpatient work up, Unsafe d/c plan, IV treatments appropriate due to intensity of illness or inability to take PO, and Inpatient level of care appropriate due to severity of illness  Dispo: The patient is from: Home              Anticipated d/c is to: Home              Patient currently is not medically stable to d/c.   Difficult to place patient No   TOTAL TIME TAKING CARE OF THIS PATIENT: 50 minutes.    Christel Mormon M.D on 02/14/2021 at 3:58 PM  [facility]   From 7 PM-7 AM, contact night-coverage [email or web address]  CC: Primary care physician; Derinda Late, MD

## 2021-02-14 NOTE — ED Notes (Signed)
Normal [place] 1,000 hung at this time

## 2021-02-14 NOTE — ED Notes (Signed)
X-ray at bedside

## 2021-02-14 NOTE — ED Notes (Addendum)
Pt to cath lab at this time

## 2021-02-14 NOTE — ED Provider Notes (Signed)
[facility] Emergency Department Provider Note  ________________________________   Event Date/Time   First MD Initiated Contact with Patient 02/14/21 1353     (approximate)  I have reviewed the triage vital signs and the nursing notes.   HISTORY  Chief Complaint No chief complaint on file. Chief complaint is chest pain   HPI Tanya Roy is a 87 y.o. female who reports sharp and heavy chest pain starting earlier today.  It does not radiate currently there was some shortness of breath but there is not any now nausea and clamminess.  Patient has not had anything like this before.  Pain eased off with some nitroglycerin and is now almost gone. Patient had 1 nitroglycerin with EMS and aspirin with EMS.   Past Medical History:  Diagnosis Date   Abnormal mammogram of right breast 2012   Microcalcifications   Arthritis    Osteoporosis     Patient Active Problem List   Diagnosis Date Noted   Hip fracture ([place]) 08/29/2016   Melanoma of skin ([place]) 03/24/2015   OP (osteoporosis) 03/04/2015   Arthritis, degenerative 02/24/2014    Past Surgical History:  Procedure Laterality Date   APPENDECTOMY     BREAST BIOPSY Right 2013   stereo, neg   FEMUR IM NAIL Right 08/29/2016   Procedure: INTRAMEDULLARY (IM) NAIL FEMORAL;  Surgeon: Hessie Knows, MD;  Location: [facility] ORS;  Service: Orthopedics;  Laterality: Right;   MELANOMA EXCISION Right 03/26/2015   Procedure: MELANOMA EXCISION;  Surgeon: Clayburn Pert, MD;  Location: [facility] ORS;  Service: General;  Laterality: Right;   ORIF WRIST FRACTURE Right 08/29/2016   Procedure: OPEN REDUCTION INTERNAL FIXATION (ORIF) WRIST FRACTURE;  Surgeon: Hessie Knows, MD;  Location: [facility] ORS;  Service: Orthopedics;  Laterality: Right;   SKIN BIOPSY  03/11/2015   Back    Prior to Admission medications   Medication Sig Start Date End Date Taking? Authorizing Provider  aspirin 325 MG EC tablet Take 325 mg by mouth daily as  needed for pain.    [provider]  aspirin EC 81 MG tablet Take 81 mg by mouth daily.    [provider]  bisacodyl (DULCOLAX) 10 MG suppository Place 1 suppository (10 mg total) rectally daily as needed for moderate constipation. 09/01/16   Epifanio Lesches, MD  docusate sodium (COLACE) 100 MG capsule Take 1 capsule (100 mg total) by mouth 2 (two) times daily as needed for mild constipation. 09/01/16   Epifanio Lesches, MD  enoxaparin (LOVENOX) 40 MG/0.4ML injection Inject 0.4 mLs (40 mg total) into the skin daily. 09/01/16 09/15/16  Duanne Guess, PA-C  methocarbamol (ROBAXIN) 500 MG tablet Take 1 tablet (500 mg total) by mouth every 6 (six) hours as needed for muscle spasms. 09/01/16   Epifanio Lesches, MD  Multiple Vitamin (MULTIVITAMIN) tablet Take 1 tablet by mouth daily.    [provider]  oxyCODONE (OXY IR/ROXICODONE) 5 MG immediate release tablet Take 1-2 tablets (5-10 mg total) by mouth every 4 (four) hours as needed for breakthrough pain ((for MODERATE breakthrough pain)). 09/01/16   Epifanio Lesches, MD  Vitamin D, Ergocalciferol, (DRISDOL) 50000 units CAPS capsule Take 50,000 Units by mouth every 7 (seven) days.    [provider]    Allergies Patient has no known allergies.  Family History  Problem Relation Age of Onset   Tuberculosis Mother    Heart disease Father    Breast cancer Neg Hx     Social History Social History  Tobacco Use   Smoking status: Never   Smokeless tobacco: Never  Substance Use Topics   Alcohol use: No    Alcohol/week: 0.0 standard drinks   Drug use: No    Review of Systems  Constitutional: No fever/chills Eyes: No visual changes. ENT: No sore throat. Cardiovascular:  chest pain. Respiratory: Denies shortness of breath. Gastrointestinal: No abdominal pain.  No nausea, no vomiting.  No diarrhea.  No constipation. Genitourinary: Negative for dysuria. Musculoskeletal: Negative for back  pain. Skin: Negative for rash. Neurological: Negative for headaches, focal weakness  ____________________________________________   PHYSICAL EXAM:  VITAL SIGNS: ED Triage Vitals  Enc Vitals Group     BP      Pulse      Resp      Temp      Temp src      SpO2      Weight      Height      Head Circumference      Peak Flow      Pain Score      Pain Loc      Pain Edu?      Excl. in [place]?     Constitutional: Alert and oriented. Well appearing and in no acute distress. Eyes: Conjunctivae are normal.  Head: Atraumatic. Nose: No congestion/rhinnorhea. Mouth/Throat: Mucous membranes are moist.  Oropharynx non-erythematous. Neck: No stridor.   Cardiovascular: Normal rate, regular rhythm. Grossly normal heart sounds.  Good peripheral circulation.  Pulses in the arms are equal bilaterally Respiratory: Normal respiratory effort.  No retractions. Lungs CTAB. Gastrointestinal: Soft and nontender. No distention. No abdominal bruits. No CVA tenderness. Musculoskeletal: No lower extremity tenderness nor edema.   Neurologic:  Normal speech and language. No gross focal neurologic deficits are appreciated.  Skin:  Skin is warm, dry and intact. No rash noted.   ____________________________________________   LABS (all labs ordered are listed, but only abnormal results are displayed)  Labs Reviewed  COMPREHENSIVE METABOLIC PANEL  CBC WITH DIFFERENTIAL/PLATELET  TROPONIN I (HIGH SENSITIVITY)   ____________________________________________  EKG  EKG read interpreted by me shows normal sinus rhythm rate of 90 normal axis there is ST elevation in 1 and L with reciprocal changes in 2 3 and F. __The precordial ST segment elevation which was evident on the EMS EKG that was transmitted online has resolved.  __________________________________________  RADIOLOGY Gertha Calkin, personally viewed and evaluated these images (plain radiographs) as part of my medical decision making, as well as  reviewing the written report by the radiologist.  ED MD interpretation:    Official radiology report(s): No results found.  ____________________________________________   PROCEDURES  Procedure(s) performed (including Critical Care):  Procedures   ____________________________________________   INITIAL IMPRESSION / ASSESSMENT AND PLAN / ED COURSE   Dr. Fletcher Anon arrives at the same time the patient does.  He evaluates patient will take her to the Cath Lab as soon as the Cath Lab table is empty and should be a few more minutes before the patient is there now is off the table.             ____________________________________________   FINAL CLINICAL IMPRESSION(S) / ED DIAGNOSES  Final diagnoses:  ST elevation myocardial infarction (STEMI), unspecified artery [facility])     ED Discharge Orders     None        Note:  This document was prepared using Dragon voice recognition software and may include unintentional dictation errors.   Conni Slipper  F, MD 02/14/21 1402

## 2021-02-14 NOTE — Progress Notes (Signed)
   02/14/21 1535  Clinical Encounter Type  Visited With Family  Visit Type Follow-up;Spiritual support;Social support  Returned to cath lab waiting area to continue supportive presence. Able to escort sister to ICU waiting area where Pt was being transported post-catheterization. Family member very grateful for support. Will follow-up as able; also informed her that pastoral support available throughout the night and that she could invite another family member to join visitation.

## 2021-02-14 NOTE — Consult Note (Signed)
Cardiology Consultation:   Patient ID: EPIMENIA LIBERA MRN: [id number]; DOB: Oct 28, 1928  Admit date: 02/14/2021 Date of Consult: 02/14/2021  PCP:  Derinda Late, MD   [place] Providers Cardiologist:  None new Fletcher Anon)     Patient Profile:   KALYNE DUNGEE is a 87 y.o. female with a hx of essential hypertension who is being seen 02/14/2021 for the evaluation of anterior ST elevation myocardial infarction at the request of Dr. Rip Harbour.  History of Present Illness:   Ms. Poulos is an 87 year old female with no prior cardiac history.  She has known history of essential hypertension and arthritis.  She lives in an assisted living facility.  She presented with substernal chest pain which started 1 hour prior to calling EMS.  The pain is substernal radiating to her neck and described as tightness feeling.  She had associated shortness of breath but no dizziness, syncope or nausea.  She reports no prior similar symptoms.  EKG by EMS showed evidence of anterior ST elevation with reciprocal ST depression in the inferior leads and thus a code STEMI was activated.  The patient received 1 sublingual nitroglycerin and aspirin and by the time she arrived to the ED she reported some improvement in her chest pain but was not resolved. Given her symptoms and EKG changes, I have recommended proceeding with emergent cardiac catheterization possible PCI.  I briefly discussed the procedure in details as well as risk and benefits.   Past Medical History:  Diagnosis Date   Abnormal mammogram of right breast 2012   Microcalcifications   Arthritis    Osteoporosis     Past Surgical History:  Procedure Laterality Date   APPENDECTOMY     BREAST BIOPSY Right 2013   stereo, neg   FEMUR IM NAIL Right 08/29/2016   Procedure: INTRAMEDULLARY (IM) NAIL FEMORAL;  Surgeon: Hessie Knows, MD;  Location: [facility] ORS;  Service: Orthopedics;  Laterality: Right;   MELANOMA EXCISION Right 03/26/2015   Procedure:  MELANOMA EXCISION;  Surgeon: Clayburn Pert, MD;  Location: [facility] ORS;  Service: General;  Laterality: Right;   ORIF WRIST FRACTURE Right 08/29/2016   Procedure: OPEN REDUCTION INTERNAL FIXATION (ORIF) WRIST FRACTURE;  Surgeon: Hessie Knows, MD;  Location: [facility] ORS;  Service: Orthopedics;  Laterality: Right;   SKIN BIOPSY  03/11/2015   Back     Home Medications:  Prior to Admission medications   Medication Sig Start Date End Date Taking? Authorizing Provider  aspirin 325 MG EC tablet Take 325 mg by mouth daily as needed for pain.    [provider]  aspirin EC 81 MG tablet Take 81 mg by mouth daily.    [provider]  bisacodyl (DULCOLAX) 10 MG suppository Place 1 suppository (10 mg total) rectally daily as needed for moderate constipation. 09/01/16   Epifanio Lesches, MD  docusate sodium (COLACE) 100 MG capsule Take 1 capsule (100 mg total) by mouth 2 (two) times daily as needed for mild constipation. 09/01/16   Epifanio Lesches, MD  enoxaparin (LOVENOX) 40 MG/0.4ML injection Inject 0.4 mLs (40 mg total) into the skin daily. 09/01/16 09/15/16  Duanne Guess, PA-C  methocarbamol (ROBAXIN) 500 MG tablet Take 1 tablet (500 mg total) by mouth every 6 (six) hours as needed for muscle spasms. 09/01/16   Epifanio Lesches, MD  Multiple Vitamin (MULTIVITAMIN) tablet Take 1 tablet by mouth daily.    [provider]  oxyCODONE (OXY IR/ROXICODONE) 5 MG immediate release tablet Take 1-2 tablets (5-10 mg total)  by mouth every 4 (four) hours as needed for breakthrough pain ((for MODERATE breakthrough pain)). 09/01/16   Epifanio Lesches, MD  Vitamin D, Ergocalciferol, (DRISDOL) 50000 units CAPS capsule Take 50,000 Units by mouth every 7 (seven) days.    [provider]    Inpatient Medications: Scheduled Meds:  Continuous Infusions:  PRN Meds: Heparin (Porcine) in NaCl, heparin sodium (porcine), lidocaine (PF), nitroGLYCERIN, ticagrelor, verapamil  Allergies:    No Known Allergies  Social History:   Social History   Socioeconomic History   Marital status: Married    Spouse name: Not on file   Number of children: Not on file   Years of education: Not on file   Highest education level: Not on file  Occupational History   Not on file  Tobacco Use   Smoking status: Never   Smokeless tobacco: Never  Substance and Sexual Activity   Alcohol use: No    Alcohol/week: 0.0 standard drinks   Drug use: No   Sexual activity: Not on file  Other Topics Concern   Not on file  Social History Narrative   Not on file   Social Determinants of Health   Financial Resource Strain: Not on file  Food Insecurity: Not on file  Transportation Needs: Not on file  Physical Activity: Not on file  Stress: Not on file  Social Connections: Not on file  Intimate Partner Violence: Not on file    Family History:    Family History  Problem Relation Age of Onset   Tuberculosis Mother    Heart disease Father    Breast cancer Neg Hx      ROS:  Please see the history of present illness.   All other ROS reviewed and negative.     Physical Exam/Data:   Vitals:   02/14/21 1405 02/14/21 1413 02/14/21 1416 02/14/21 1419  BP:      Pulse: 90     Resp: 16     SpO2: 95% 96%  95%  Weight:   63.5 kg   Height:   '5\' 6"'$  (1.676 m)    No intake or output data in the 24 hours ending 02/14/21 1454 Last 3 Weights 02/14/2021 08/29/2016 04/14/2015  Weight (lbs) 139 lb 15.9 oz 140 lb 128 lb  Weight (kg) 63.5 kg 63.504 kg 58.06 kg     Body mass index is 22.6 kg/m.  General:  Well nourished, well developed, in no acute distress HEENT: normal Lymph: no adenopathy Neck: no JVD Endocrine:  No thryomegaly Vascular: No carotid bruits; FA pulses 2+ bilaterally without bruits  Cardiac:  normal S1, S2; RRR; no murmur  Lungs:  clear to auscultation bilaterally, no wheezing, rhonchi or rales  Abd: soft, nontender, no hepatomegaly  Ext: no edema Musculoskeletal:  No  deformities, BUE and BLE strength normal and equal Skin: warm and dry  Neuro:  CNs 2-12 intact, no focal abnormalities noted Psych:  Normal affect   EKG:  The EKG was personally reviewed and demonstrates: Normal sinus rhythm with 1 to 2 mm of anterior ST elevation with reciprocal ST depression in the anterior leads. Telemetry:  Telemetry was personally reviewed and demonstrates:    Relevant CV Studies:   Laboratory Data:  High Sensitivity Troponin:   Recent Labs  Lab 02/14/21 1400  TROPONINIHS 184*     ChemistryNo results for input(s): NA, K, CL, CO2, GLUCOSE, BUN, CREATININE, CALCIUM, GFRNONAA, GFRAA, ANIONGAP in the last 168 hours.  No results for input(s): PROT, ALBUMIN, AST, ALT, ALKPHOS,  BILITOT in the last 168 hours. HematologyNo results for input(s): WBC, RBC, HGB, HCT, MCV, MCH, MCHC, RDW, PLT in the last 168 hours. BNPNo results for input(s): BNP, PROBNP in the last 168 hours.  DDimer No results for input(s): DDIMER in the last 168 hours.   Radiology/Studies:  DG Chest Portable 1 View  Result Date: 02/14/2021 CLINICAL DATA:  Chest pain. EXAM: PORTABLE CHEST 1 VIEW COMPARISON:  August 29, 2016. FINDINGS: Stable cardiomegaly. No pneumothorax is noted. Mild bibasilar interstitial densities are noted concerning for pulmonary edema. No definite pleural effusion is noted. Bony thorax is unremarkable. IMPRESSION: Stable cardiomegaly. Mild bibasilar interstitial densities are noted concerning for possible pulmonary edema. Electronically Signed   By: Marijo Conception M.D.   On: 02/14/2021 14:33     Assessment and Plan:   Anterior ST elevation myocardial infarction: The patient was given aspirin and I have recommended 4000 units of unfractionated heparin.  Given continued symptoms and EKG changes, I recommend proceeding with emergent cardiac catheterization and possible PCI.  Further recommendations to follow after cardiac catheterization. Suspected acute systolic heart failure due to  post myocardial infarction cardiomyopathy: We will plan on starting small dose carvedilol and ACE inhibitor/ARB.  Obtain an echocardiogram. Hyperlipidemia: Start high-dose atorvastatin. Essential hypertension: Adjust medications to treat cardiomyopathy.   Risk Assessment/Risk Scores:     TIMI Risk Score for ST  Elevation MI:   The patient's TIMI risk score is 6, which indicates a 16.1% risk of all cause mortality at 30 days.   For questions or updates, please contact [place] Please consult [email or web address] for contact info under    Signed, Kathlyn Sacramento, MD  02/14/2021 2:54 PM

## 2021-02-14 NOTE — ED Notes (Signed)
Second iv placed and blood drawn at this time

## 2021-02-14 NOTE — ED Notes (Signed)
4000 units heparin given at this time

## 2021-02-15 ENCOUNTER — Other Ambulatory Visit: Payer: Self-pay | Admitting: *Deleted

## 2021-02-15 ENCOUNTER — Encounter: Payer: Self-pay | Admitting: Cardiovascular Disease

## 2021-02-15 ENCOUNTER — Inpatient Hospital Stay (HOSPITAL_COMMUNITY)
Admit: 2021-02-15 | Discharge: 2021-02-15 | Disposition: A | Discharge status: Home / Self Care | Payer: Medicare Other | Attending: Cardiology | Admitting: Cardiology

## 2021-02-15 DIAGNOSIS — I219 Acute myocardial infarction, unspecified: Secondary | ICD-10-CM

## 2021-02-15 DIAGNOSIS — I2102 ST elevation (STEMI) myocardial infarction involving left anterior descending coronary artery: Secondary | ICD-10-CM | POA: Diagnosis not present

## 2021-02-15 DIAGNOSIS — I5021 Acute systolic (congestive) heart failure: Secondary | ICD-10-CM | POA: Diagnosis not present

## 2021-02-15 DIAGNOSIS — Z955 Presence of coronary angioplasty implant and graft: Secondary | ICD-10-CM

## 2021-02-15 DIAGNOSIS — I213 ST elevation (STEMI) myocardial infarction of unspecified site: Secondary | ICD-10-CM

## 2021-02-15 LAB — ECHOCARDIOGRAM COMPLETE
AR max vel: 2.76 cm2
AV Area VTI: 3.09 cm2
AV Area mean vel: 2.73 cm2
AV Mean grad: 2 mmHg
AV Peak grad: 3 mmHg
Ao pk vel: 0.86 m/s
Area-P 1/2: 4.33 cm2
Height: 66 in
S' Lateral: 2.85 cm
Single Plane A4C EF: 34 %
Weight: 2239.87 oz

## 2021-02-15 LAB — URINALYSIS, ROUTINE W REFLEX MICROSCOPIC
Bilirubin Urine: NEGATIVE
Glucose, UA: NEGATIVE mg/dL
Hgb urine dipstick: NEGATIVE
Ketones, ur: NEGATIVE mg/dL
Nitrite: NEGATIVE
Protein, ur: NEGATIVE mg/dL
Specific Gravity, Urine: 1.03 (ref 1.005–1.030)
WBC, UA: >50 WBC/hpf — ABNORMAL HIGH (ref 0–5)
pH: 6 (ref 5.0–8.0)

## 2021-02-15 LAB — CBC
HCT: 40.8 % (ref 36.0–46.0)
Hemoglobin: 14.4 g/dL (ref 12.0–15.0)
MCH: 33.1 pg (ref 26.0–34.0)
MCHC: 35.3 g/dL (ref 30.0–36.0)
MCV: 93.8 fL (ref 80.0–100.0)
Platelets: 197 10*3/uL (ref 150–400)
RBC: 4.35 MIL/uL (ref 3.87–5.11)
RDW: 13 % (ref 11.5–15.5)
WBC: 12 10*3/uL — ABNORMAL HIGH (ref 4.0–10.5)
nRBC: 0 % (ref 0.0–0.2)

## 2021-02-15 LAB — BASIC METABOLIC PANEL
Anion gap: 10 (ref 5–15)
BUN: 22 mg/dL (ref 8–23)
CO2: 23 mmol/L (ref 22–32)
Calcium: 8.8 mg/dL — ABNORMAL LOW (ref 8.9–10.3)
Chloride: 105 mmol/L (ref 98–111)
Creatinine, Ser: 0.79 mg/dL (ref 0.44–1.00)
GFR, Estimated: >60 mL/min (ref >60)
Glucose, Bld: 112 mg/dL — ABNORMAL HIGH (ref 70–99)
Potassium: 3.5 mmol/L (ref 3.5–5.1)
Sodium: 138 mmol/L (ref 135–145)

## 2021-02-15 MED ORDER — QUETIAPINE FUMARATE 25 MG PO TABS
50.0000 mg | ORAL_TABLET | Freq: Every day | ORAL | Status: DC
  Administered 2021-02-15 – 2021-02-16 (×2): 50 mg via ORAL
  Filled 2021-02-15 (×2): qty 2

## 2021-02-15 MED ORDER — CHLORHEXIDINE GLUCONATE CLOTH 2 % EX PADS
6.0000 | MEDICATED_PAD | Freq: Every day | CUTANEOUS | Status: DC
  Administered 2021-02-15 – 2021-02-16 (×2): 6 via TOPICAL

## 2021-02-15 MED ORDER — QUETIAPINE FUMARATE 25 MG PO TABS
50.0000 mg | ORAL_TABLET | Freq: Once | ORAL | Status: DC | PRN
  Filled 2021-02-15: qty 2

## 2021-02-15 NOTE — Progress Notes (Signed)
*  PRELIMINARY RESULTS* Echocardiogram 2D Echocardiogram has been performed.  Tanya Roy 02/15/2021, 10:53 AM

## 2021-02-15 NOTE — Progress Notes (Addendum)
PROGRESS NOTE    Tanya Roy  [id number] DOB: 04/15/1929 DOA: 02/14/2021 PCP: Derinda Late, MD  [id number]   Assessment & Plan:   Active Problems:   STEMI (ST elevation myocardial infarction) ([place])   Tanya Roy is a 87 y.o. female with medical history significant for osteoarthritis and osteoporosis, who presented to the emergency room with acute onset of midsternal chest pain.  Initial EKG showed anterior ST elevation with reciprocal ST segment depression in inferior leads and code STEMI was activated.  She was started on IV heparin and was taken to the Cath Lab.  She had PCI and drug-eluting stent placement to the LAD by Dr. Fletcher Anon.  She will need staged PCI to the left circumflex and RCA which can be done as an outpatient.    Acute ST segment elevation myocardial infarction status post PCI and LAD stent --cont ASA and Brilinta --Dual antiplatelet therapy for at least 12 months. --cont coreg (new) --Echo --cont statin --cardiac rehab  Significant three-vessel CAD LHC 02/14/2021:   Mid LAD lesion is 100% stenosed.   Prox Cx lesion is 90% stenosed.   Prox RCA lesion is 85% stenosed. Plan: --cont ASA, Brilinta and statin --cardio recommends staged PCI of the left circumflex and right coronary artery in few weeks.  Essential hypertension. --cont coreg (new) --cardio to add ACEi/ARB after Echo result  HLD -LDL 76 in 08/2020 with goal being < 70 --cont lipitor 80 mg daily  Confusion, agitation, insomnia 2/2 hospital delirium --seroquel 50 mg nightly --extra seroquel 50 mg can be added if first 50 mg not enough --reassurance, re-orientation and redirection   DVT prophylaxis: Lovenox SQ Code Status: Full code  Family Communication: niece updated at bedside today Level of care: stepdown Dispo:   The patient is from: home Anticipated d/c is to: home Anticipated d/c date is: 1-2 days Patient currently is not medically ready to d/c due to: post MI,  need to monitor per cardio.   Subjective and Interval History:  Pt denied chest pain, no dysuria.  Pt was noted to be confused, and didn't sleep last night.   Objective: Vitals:   02/15/21 1600 02/15/21 1615 02/15/21 1700 02/15/21 1800  BP: (!) 149/91 (!) 149/91 (!) 152/82 138/70  Pulse:  78    Resp: (!) '27  18 20  '$ Temp: 98.6 F (37 C)   98.2 F (36.8 C)  TempSrc: Oral   Axillary  SpO2: 97%  97% 96%  Weight:      Height:        Intake/Output Summary (Last 24 hours) at 02/15/2021 2022 Last data filed at 02/15/2021 1800 Gross per 24 hour  Intake 653 ml  Output 1400 ml  Net -747 ml   Filed Weights   02/14/21 1416  Weight: 63.5 kg    Examination:   Constitutional: NAD, alert, oriented to self  HEENT: conjunctivae and lids normal, EOMI CV: No cyanosis.   RESP: normal respiratory effort, on RA Extremities: No effusions, edema in BLE SKIN: warm, dry Neuro: II - XII grossly intact.     Data Reviewed: I have personally reviewed following labs and imaging studies  CBC: Recent Labs  Lab 02/14/21 1536 02/15/21 0553  WBC 14.9* 12.0*  NEUTROABS 12.7*  --   HGB 14.0 14.4  HCT 41.2 40.8  MCV 98.3 93.8  PLT 189 [id number]   Basic Metabolic Panel: Recent Labs  Lab 02/14/21 1536 02/15/21 0553  NA 137 138  K 3.6 3.5  CL 104 105  CO2 24 23  GLUCOSE 142* 112*  BUN 21 22  CREATININE 0.91 0.79  CALCIUM 8.7* 8.8*   GFR: Estimated Creatinine Clearance: 44.6 mL/min (by C-G formula based on SCr of 0.79 mg/dL). Liver Function Tests: Recent Labs  Lab 02/14/21 1536  AST 76*  ALT 21  ALKPHOS 67  BILITOT 1.0  PROT 6.1*  ALBUMIN 3.8   No results for input(s): LIPASE, AMYLASE in the last 168 hours. No results for input(s): AMMONIA in the last 168 hours. Coagulation Profile: Recent Labs  Lab 02/14/21 1400  INR 1.0   Cardiac Enzymes: No results for input(s): CKTOTAL, CKMB, CKMBINDEX, TROPONINI in the last 168 hours. BNP (last 3 results) No results for input(s):  PROBNP in the last 8760 hours. HbA1C: No results for input(s): HGBA1C in the last 72 hours. CBG: Recent Labs  Lab 02/14/21 1523  GLUCAP 136*   Lipid Profile: No results for input(s): CHOL, HDL, LDLCALC, TRIG, CHOLHDL, LDLDIRECT in the last 72 hours. Thyroid Function Tests: No results for input(s): TSH, T4TOTAL, FREET4, T3FREE, THYROIDAB in the last 72 hours. Anemia Panel: No results for input(s): VITAMINB12, FOLATE, FERRITIN, TIBC, IRON, RETICCTPCT in the last 72 hours. Sepsis Labs: No results for input(s): PROCALCITON, LATICACIDVEN in the last 168 hours.  Recent Results (from the past 240 hour(s))  Resp Panel by RT-PCR (Flu A&B, Covid) Nasopharyngeal Swab     Status: None   Collection Time: 02/14/21  2:42 PM   Specimen: Nasopharyngeal Swab; Nasopharyngeal(NP) swabs in vial transport medium  Result Value Ref Range Status   SARS Coronavirus 2 by RT PCR NEGATIVE NEGATIVE Final    Comment: (NOTE) SARS-CoV-2 target nucleic acids are NOT DETECTED.  The SARS-CoV-2 RNA is generally detectable in upper respiratory specimens during the acute phase of infection. The lowest concentration of SARS-CoV-2 viral copies this assay can detect is 138 copies/mL. A negative result does not preclude SARS-Cov-2 infection and should not be used as the sole basis for treatment or other patient management decisions. A negative result may occur with  improper specimen collection/handling, submission of specimen other than nasopharyngeal swab, presence of viral mutation(s) within the areas targeted by this assay, and inadequate number of viral copies(<138 copies/mL). A negative result must be combined with clinical observations, patient history, and epidemiological information. The expected result is Negative.  Fact Sheet for Patients:  EntrepreneurPulse.com.au  Fact Sheet for Healthcare Providers:  IncredibleEmployment.be  This test is no t yet approved or  cleared by the [place] FDA and  has been authorized for detection and/or diagnosis of SARS-CoV-2 by FDA under an Emergency Use Authorization (EUA). This EUA will remain  in effect (meaning this test can be used) for the duration of the COVID-19 declaration under Section 564(b)(1) of the Act, 21 U.S.C.section 360bbb-3(b)(1), unless the authorization is terminated  or revoked sooner.       Influenza A by PCR NEGATIVE NEGATIVE Final   Influenza B by PCR NEGATIVE NEGATIVE Final    Comment: (NOTE) The Xpert Xpress SARS-CoV-2/FLU/RSV plus assay is intended as an aid in the diagnosis of influenza from Nasopharyngeal swab specimens and should not be used as a sole basis for treatment. Nasal washings and aspirates are unacceptable for Xpert Xpress SARS-CoV-2/FLU/RSV testing.  Fact Sheet for Patients: EntrepreneurPulse.com.au  Fact Sheet for Healthcare Providers: IncredibleEmployment.be  This test is not yet approved or cleared by the [place] FDA and has been authorized for detection and/or diagnosis of SARS-CoV-2 by FDA under an Emergency Use Authorization (EUA). This  EUA will remain in effect (meaning this test can be used) for the duration of the COVID-19 declaration under Section 564(b)(1) of the Act, 21 U.S.C. section 360bbb-3(b)(1), unless the authorization is terminated or revoked.  Performed at [facility], [address], [place], [place] [address]       Radiology Studies: CARDIAC CATHETERIZATION  Result Date: 02/14/2021   Mid LAD lesion is 100% stenosed.   Prox Cx lesion is 90% stenosed.   Prox RCA lesion is 85% stenosed.   A drug-eluting stent was successfully placed using a STENT ONYX FRONTIER [id number].   Post intervention, there is a 0% residual stenosis. 1.  Significant three-vessel coronary artery disease.  The culprit for anterior STEMI is an occluded mid LAD. 2.  Left ventricular angiography was not performed. 3.   Moderately to severely elevated left ventricular end-diastolic pressure at 30 mmHg. 4.  Successful angioplasty and drug-eluting stent placement to the mid LAD.  A second overlapped stent was used distally due to suspected distal edge dissection Recommendations: Dual antiplatelet therapy for at least 12 months. Aggressive treatment of risk factors. Obtain an echocardiogram to evaluate ejection fraction.  I started small dose carvedilol with plans to start an ACE inhibitor or ARB. Recommend staged PCI of the left circumflex and right coronary artery in few weeks.   DG Chest Portable 1 View  Result Date: 02/14/2021 CLINICAL DATA:  Chest pain. EXAM: PORTABLE CHEST 1 VIEW COMPARISON:  August 29, 2016. FINDINGS: Stable cardiomegaly. No pneumothorax is noted. Mild bibasilar interstitial densities are noted concerning for pulmonary edema. No definite pleural effusion is noted. Bony thorax is unremarkable. IMPRESSION: Stable cardiomegaly. Mild bibasilar interstitial densities are noted concerning for possible pulmonary edema. Electronically Signed   By: Marijo Conception M.D.   On: 02/14/2021 14:33   ECHOCARDIOGRAM COMPLETE  Result Date: 02/15/2021    ECHOCARDIOGRAM REPORT   Patient Name:   [name] Date of Exam: 02/15/2021 Medical Rec #:  [id number]         Height:       66.0 in Accession #:    [id number]        Weight:       140.0 lb Date of Birth:  [birth year]        BSA:          1.718 m Patient Age:    53 years          BP:           138/81 mmHg Patient Gender: F                 HR:           65 bpm. Exam Location:  [facility] Procedure: 2D Echo, Cardiac Doppler and Color Doppler Indications:     Acute myocardial infarction-unspecified I21.9  History:         Patient has no prior history of Echocardiogram examinations. No                  past heart history in file.  Sonographer:     Sherrie Sport Referring Phys:  [id number] Kathrin Ruddy ORGEL Diagnosing Phys: Nelva Bush MD  Sonographer Comments: Suboptimal apical  window. IMPRESSIONS  1. Left ventricular ejection fraction, by estimation, is 30 to 35%. The left ventricle has moderately decreased function. The left ventricle demonstrates regional wall motion abnormalities (see scoring diagram/findings for description). There is mild left ventricular hypertrophy. Left ventricular diastolic parameters are consistent with Grade I  diastolic dysfunction (impaired relaxation). Elevated left atrial pressure.  2. Right ventricular systolic function is normal. The right ventricular size is normal.  3. A small pericardial effusion is present.  4. The mitral valve is degenerative. Trivial mitral valve regurgitation. No evidence of mitral stenosis.  5. The aortic valve was not well visualized. Aortic valve regurgitation is trivial. No aortic stenosis is present. FINDINGS  Left Ventricle: Left ventricular ejection fraction, by estimation, is 30 to 35%. The left ventricle has moderately decreased function. The left ventricle demonstrates regional wall motion abnormalities. The left ventricular internal cavity size was normal in size. There is mild left ventricular hypertrophy. Left ventricular diastolic parameters are consistent with Grade I diastolic dysfunction (impaired relaxation). Elevated left atrial pressure.  LV Wall Scoring: The mid and distal anterior wall, mid and distal anterior septum, mid inferoseptal segment, apical inferior segment, and apex are akinetic. The entire lateral wall, inferior wall, basal anteroseptal segment, basal anterior segment, and basal inferoseptal segment are normal. Right Ventricle: The right ventricular size is normal. No increase in right ventricular wall thickness. Right ventricular systolic function is normal. Left Atrium: Left atrial size was normal in size. Right Atrium: Right atrial size was normal in size. Pericardium: A small pericardial effusion is present. Mitral Valve: The mitral valve is degenerative in appearance. There is mild thickening  of the mitral valve leaflet(s). Trivial mitral valve regurgitation. No evidence of mitral valve stenosis. Tricuspid Valve: The tricuspid valve is not well visualized. Tricuspid valve regurgitation is trivial. Aortic Valve: The aortic valve was not well visualized. Aortic valve regurgitation is trivial. No aortic stenosis is present. Aortic valve mean gradient measures 2.0 mmHg. Aortic valve peak gradient measures 3.0 mmHg. Aortic valve area, by VTI measures 3.09 cm. Pulmonic Valve: The pulmonic valve was grossly normal. Pulmonic valve regurgitation is not visualized. No evidence of pulmonic stenosis. Aorta: The aortic root is normal in size and structure. Pulmonary Artery: The pulmonary artery is of normal size. Venous: The inferior vena cava was not well visualized. IAS/Shunts: The interatrial septum was not well visualized.  LEFT VENTRICLE PLAX 2D LVIDd:         4.38 cm     Diastology LVIDs:         2.85 cm     LV e' medial:    3.59 cm/s LV PW:         1.25 cm     LV E/e' medial:  15.4 LV IVS:        0.89 cm     LV e' lateral:   3.92 cm/s LVOT diam:     2.00 cm     LV E/e' lateral: 14.1 LV SV:         55 LV SV Index:   32 LVOT Area:     3.14 cm  LV Volumes (MOD) LV vol d, MOD A4C: 62.7 ml LV vol s, MOD A4C: 41.4 ml LV SV MOD A4C:     62.7 ml RIGHT VENTRICLE RV Basal diam:  3.12 cm RV S prime:     14.30 cm/s TAPSE (M-mode): 3.3 cm LEFT ATRIUM           Index       RIGHT ATRIUM           Index LA diam:      3.50 cm 2.04 cm/m  RA Area:     13.40 cm LA Vol (A4C): 53.0 ml 30.84 ml/m RA Volume:   28.60 ml  16.64 ml/m  AORTIC VALVE                   PULMONIC VALVE AV Area (Vmax):    2.76 cm    PV Vmax:        0.70 m/s AV Area (Vmean):   2.73 cm    PV Peak grad:   1.9 mmHg AV Area (VTI):     3.09 cm    RVOT Peak grad: 2 mmHg AV Vmax:           86.10 cm/s AV Vmean:          65.400 cm/s AV VTI:            0.179 m AV Peak Grad:      3.0 mmHg AV Mean Grad:      2.0 mmHg LVOT Vmax:         75.70 cm/s LVOT Vmean:         56.800 cm/s LVOT VTI:          0.176 m LVOT/AV VTI ratio: 0.98  AORTA Ao Root diam: 2.70 cm MITRAL VALVE               TRICUSPID VALVE MV Area (PHT): 4.33 cm    TR Peak grad:   16.8 mmHg MV Decel Time: 175 msec    TR Vmax:        205.00 cm/s MV E velocity: 55.30 cm/s MV A velocity: 81.40 cm/s  SHUNTS MV E/A ratio:  0.68        Systemic VTI:  0.18 m                            Systemic Diam: 2.00 cm Nelva Bush MD Electronically signed by Nelva Bush MD Signature Date/Time: 02/15/2021/4:28:02 PM    Final      Scheduled Meds:  aspirin EC  81 mg Oral Daily   atorvastatin  80 mg Oral QHS   carvedilol  3.125 mg Oral BID WC   Chlorhexidine Gluconate Cloth  6 each Topical Daily   enoxaparin (LOVENOX) injection  40 mg Subcutaneous Q24H   multivitamin with minerals  1 tablet Oral Daily   QUEtiapine  50 mg Oral QHS   sodium chloride flush  3 mL Intravenous Q12H   ticagrelor  90 mg Oral BID   Continuous Infusions:  sodium chloride       LOS: 1 day     Enzo Bi, MD [facility] If 7PM-7AM, please contact night-coverage 02/15/2021, 8:22 PM

## 2021-02-15 NOTE — Progress Notes (Signed)
   02/15/21 1314  Clinical Encounter Type  Visited With Patient and family together  Visit Type Initial;Spiritual support;Social support  Referral From Chaplain  Consult/Referral To [place] visited PT and her niece who was at bedside. Hospitality, prayer, reflective listening, and compassionate presence were all elements of this visit. PT's niece informed the chaplain that PT would be returning home by tomorrow, since she has progressed.    Andee Poles, M. Div.

## 2021-02-15 NOTE — Progress Notes (Signed)
Progress Note  Patient Name: Tanya Roy Date of Encounter: 02/15/2021  Primary Cardiologist: New to [facility] - consult by Arida  Subjective   No chest pain, dyspnea, palpitations, or dizziness. No issues from cath site. Post cath labs and vitals are stable. Echo pending.   Inpatient Medications    Scheduled Meds:  aspirin EC  81 mg Oral Daily   atorvastatin  80 mg Oral QHS   carvedilol  3.125 mg Oral BID WC   enoxaparin (LOVENOX) injection  40 mg Subcutaneous Q24H   multivitamin with minerals  1 tablet Oral Daily   sodium chloride flush  3 mL Intravenous Q12H   ticagrelor  90 mg Oral BID   Continuous Infusions:  sodium chloride     PRN Meds: sodium chloride, acetaminophen, bisacodyl, docusate sodium, magnesium hydroxide, nitroGLYCERIN, ondansetron (ZOFRAN) IV, sodium chloride flush, traZODone   Vital Signs    Vitals:   02/15/21 0200 02/15/21 0240 02/15/21 0300 02/15/21 0400  BP: 138/72  (!) 142/73 (!) 146/76  Pulse: 69  72 71  Resp: 14  (!) 22 14  Temp:  98.5 F (36.9 C)    TempSrc:  Oral    SpO2: 97%  96% 97%  Weight:      Height:        Intake/Output Summary (Last 24 hours) at 02/15/2021 0748 Last data filed at 02/15/2021 0409 Gross per 24 hour  Intake 3 ml  Output 400 ml  Net -397 ml   Filed Weights   02/14/21 1416  Weight: 63.5 kg    Telemetry    SR with rare PVCs - Personally Reviewed  ECG    No new tracings - Personally Reviewed  Physical Exam   GEN: No acute distress.   Neck: No JVD. Cardiac: RRR, no murmurs, rubs, or gallops.  Respiratory: Clear to auscultation bilaterally.  GI: Soft, nontender, non-distended.   MS: No edema; No deformity. Neuro:  Alert; Nonfocal.  Psych: Normal affect.  Labs    Chemistry Recent Labs  Lab 02/14/21 1536 02/15/21 0553  NA 137 138  K 3.6 3.5  CL 104 105  CO2 24 23  GLUCOSE 142* 112*  BUN 21 22  CREATININE 0.91 0.79  CALCIUM 8.7* 8.8*  PROT 6.1*  --   ALBUMIN 3.8  --   AST 76*  --    ALT 21  --   ALKPHOS 67  --   BILITOT 1.0  --   GFRNONAA >60 >60  ANIONGAP 9 10     Hematology Recent Labs  Lab 02/14/21 1536 02/15/21 0553  WBC 14.9* 12.0*  RBC 4.19 4.35  HGB 14.0 14.4  HCT 41.2 40.8  MCV 98.3 93.8  MCH 33.4 33.1  MCHC 34.0 35.3  RDW 13.2 13.0  PLT 189 197    Cardiac EnzymesNo results for input(s): TROPONINI in the last 168 hours. No results for input(s): TROPIPOC in the last 168 hours.   BNPNo results for input(s): BNP, PROBNP in the last 168 hours.   DDimer No results for input(s): DDIMER in the last 168 hours.   Radiology    DG Chest Portable 1 View  Result Date: 02/14/2021 IMPRESSION: Stable cardiomegaly. Mild bibasilar interstitial densities are noted concerning for possible pulmonary edema. Electronically Signed   By: Marijo Conception M.D.   On: 02/14/2021 14:33    Cardiac Studies   LHC 02/14/2021:   Mid LAD lesion is 100% stenosed.   Prox Cx lesion is 90% stenosed.  Prox RCA lesion is 85% stenosed.   A drug-eluting stent was successfully placed using a STENT ONYX FRONTIER 3.0X26.   Post intervention, there is a 0% residual stenosis.   1.  Significant three-vessel coronary artery disease.  The culprit for anterior STEMI is an occluded mid LAD. 2.  Left ventricular angiography was not performed. 3.  Moderately to severely elevated left ventricular end-diastolic pressure at 30 mmHg. 4.  Successful angioplasty and drug-eluting stent placement to the mid LAD.  A second overlapped stent was used distally due to suspected distal edge dissection   Recommendations: Dual antiplatelet therapy for at least 12 months. Aggressive treatment of risk factors. Obtain an echocardiogram to evaluate ejection fraction.  I started small dose carvedilol with plans to start an ACE inhibitor or ARB. Recommend staged PCI of the left circumflex and right coronary artery in few weeks.  Diagnostic Dominance: Right Intervention     __________  2D echo  pending  Patient Profile     87 y.o. female with history of HTN admitted on 02/14/2021 with an anterior STEMI s/p PCI/DES to the LAD as above.   Assessment & Plan    1. Anterior STEMI: -Chest pain free following PCI to the LAD -There is residual multivessel CAD as outlined above with recommendation to proceed with staged PCI as an outpatient in a few weeks -Continue DAPT without interruption for at least 12 months with ASA and Brilinta -Echo pending -Aggressive risk factor modification -Post cath instructions -Cardiac rehab  2 . Suspected acute HFrEF secondary to ICM: -She appears euvolemic -Echo pending -Continue Coreg -Look to add ACEi or ARB following echo to further optimize GDMT -CHF education  3. HTN: -Blood pressure overall stable -Coreg as above -Look to add ACEi/ARB post echo  4. HLD: -LDL 76 in 08/2020 with goal being < 70 -Lipitor 80 mg -Follow up fasting lipid panel and LFT in the outpatient setting in ~ 8 weeks with recommendation to escalate lipid therapy as needed to achieve a target LDL of < 70   For questions or updates, please contact [facility] Please consult [email or web address] for contact info under Cardiology/STEMI.    Signed, Christell Faith, PA-C [place] Pager: [phone] 02/15/2021, 7:48 AM

## 2021-02-16 LAB — BASIC METABOLIC PANEL
Anion gap: 8 (ref 5–15)
BUN: 21 mg/dL (ref 8–23)
CO2: 26 mmol/L (ref 22–32)
Calcium: 8.8 mg/dL — ABNORMAL LOW (ref 8.9–10.3)
Chloride: 104 mmol/L (ref 98–111)
Creatinine, Ser: 0.93 mg/dL (ref 0.44–1.00)
GFR, Estimated: 59 mL/min — ABNORMAL LOW (ref >60)
Glucose, Bld: 100 mg/dL — ABNORMAL HIGH (ref 70–99)
Potassium: 3.4 mmol/L — ABNORMAL LOW (ref 3.5–5.1)
Sodium: 138 mmol/L (ref 135–145)

## 2021-02-16 LAB — CBC
HCT: 41.7 % (ref 36.0–46.0)
Hemoglobin: 14 g/dL (ref 12.0–15.0)
MCH: 32.2 pg (ref 26.0–34.0)
MCHC: 33.6 g/dL (ref 30.0–36.0)
MCV: 95.9 fL (ref 80.0–100.0)
Platelets: 179 10*3/uL (ref 150–400)
RBC: 4.35 MIL/uL (ref 3.87–5.11)
RDW: 13.1 % (ref 11.5–15.5)
WBC: 11.3 10*3/uL — ABNORMAL HIGH (ref 4.0–10.5)
nRBC: 0 % (ref 0.0–0.2)

## 2021-02-16 LAB — MAGNESIUM: Magnesium: 2.2 mg/dL (ref 1.7–2.4)

## 2021-02-16 MED ORDER — LOSARTAN POTASSIUM 50 MG PO TABS
25.0000 mg | ORAL_TABLET | Freq: Every day | ORAL | Status: DC
  Administered 2021-02-16 – 2021-02-17 (×2): 25 mg via ORAL
  Filled 2021-02-16 (×2): qty 1

## 2021-02-16 MED ORDER — POTASSIUM CHLORIDE CRYS ER 20 MEQ PO TBCR
40.0000 meq | EXTENDED_RELEASE_TABLET | Freq: Once | ORAL | Status: AC
  Administered 2021-02-16: 40 meq via ORAL
  Filled 2021-02-16: qty 2

## 2021-02-16 NOTE — Progress Notes (Signed)
Progress Note  Patient Name: Tanya Roy Date of Encounter: 02/16/2021  Primary Cardiologist: New to [facility] - consult by Fletcher Anon  Subjective   More alert today. No chest pain, dyspnea, palpitations, or dizziness. She was pickling at her cath site, and with this has developed some ecchymosis. BP soft, though stable.   Inpatient Medications    Scheduled Meds:  aspirin EC  81 mg Oral Daily   atorvastatin  80 mg Oral QHS   carvedilol  3.125 mg Oral BID WC   Chlorhexidine Gluconate Cloth  6 each Topical Daily   enoxaparin (LOVENOX) injection  40 mg Subcutaneous Q24H   multivitamin with minerals  1 tablet Oral Daily   QUEtiapine  50 mg Oral QHS   sodium chloride flush  3 mL Intravenous Q12H   ticagrelor  90 mg Oral BID   Continuous Infusions:  sodium chloride     PRN Meds: sodium chloride, acetaminophen, bisacodyl, docusate sodium, magnesium hydroxide, nitroGLYCERIN, ondansetron (ZOFRAN) IV, QUEtiapine, sodium chloride flush, traZODone   Vital Signs    Vitals:   02/16/21 0400 02/16/21 0600 02/16/21 0700 02/16/21 0800  BP:  114/70 (!) 111/56   Pulse: 69 67 69   Resp: 15 (!) 24 (!) 22   Temp:    98.2 F (36.8 C)  TempSrc:    Oral  SpO2: 97% 100% 98%   Weight:      Height:        Intake/Output Summary (Last 24 hours) at 02/16/2021 0909 Last data filed at 02/16/2021 0800 Gross per 24 hour  Intake 553 ml  Output 850 ml  Net -297 ml    Filed Weights   02/14/21 1416  Weight: 63.5 kg    Telemetry    SR - Personally Reviewed  ECG    No new tracings - Personally Reviewed  Physical Exam   GEN: No acute distress.   Neck: No JVD. Cardiac: RRR, no murmurs, rubs, or gallops. Right radial cath site without active bleeding. Soft ecchymosis noted without TTP. Radial pulse 2+.  Respiratory: Clear to auscultation bilaterally.  GI: Soft, nontender, non-distended.   MS: No edema; No deformity. Neuro:  Alert and oriented x 3 today; Nonfocal.  Psych: Normal  affect.  Labs    Chemistry Recent Labs  Lab 02/14/21 1536 02/15/21 0553 02/16/21 0426  NA 137 138 138  K 3.6 3.5 3.4*  CL 104 105 104  CO2 '24 23 26  '$ GLUCOSE 142* 112* 100*  BUN '21 22 21  '$ CREATININE 0.91 0.79 0.93  CALCIUM 8.7* 8.8* 8.8*  PROT 6.1*  --   --   ALBUMIN 3.8  --   --   AST 76*  --   --   ALT 21  --   --   ALKPHOS 67  --   --   BILITOT 1.0  --   --   GFRNONAA >60 >60 59*  ANIONGAP '9 10 8      '$ Hematology Recent Labs  Lab 02/14/21 1536 02/15/21 0553 02/16/21 0426  WBC 14.9* 12.0* 11.3*  RBC 4.19 4.35 4.35  HGB 14.0 14.4 14.0  HCT 41.2 40.8 41.7  MCV 98.3 93.8 95.9  MCH 33.4 33.1 32.2  MCHC 34.0 35.3 33.6  RDW 13.2 13.0 13.1  PLT 189 197 179     Cardiac EnzymesNo results for input(s): TROPONINI in the last 168 hours. No results for input(s): TROPIPOC in the last 168 hours.   BNPNo results for input(s): BNP, PROBNP in the  last 168 hours.   DDimer No results for input(s): DDIMER in the last 168 hours.   Radiology    DG Chest Portable 1 View  Result Date: 02/14/2021 IMPRESSION: Stable cardiomegaly. Mild bibasilar interstitial densities are noted concerning for possible pulmonary edema. Electronically Signed   By: Marijo Conception M.D.   On: 02/14/2021 14:33    Cardiac Studies   LHC 02/14/2021:   Mid LAD lesion is 100% stenosed.   Prox Cx lesion is 90% stenosed.   Prox RCA lesion is 85% stenosed.   A drug-eluting stent was successfully placed using a STENT ONYX FRONTIER 3.0X26.   Post intervention, there is a 0% residual stenosis.   1.  Significant three-vessel coronary artery disease.  The culprit for anterior STEMI is an occluded mid LAD. 2.  Left ventricular angiography was not performed. 3.  Moderately to severely elevated left ventricular end-diastolic pressure at 30 mmHg. 4.  Successful angioplasty and drug-eluting stent placement to the mid LAD.  A second overlapped stent was used distally due to suspected distal edge dissection    Recommendations: Dual antiplatelet therapy for at least 12 months. Aggressive treatment of risk factors. Obtain an echocardiogram to evaluate ejection fraction.  I started small dose carvedilol with plans to start an ACE inhibitor or ARB. Recommend staged PCI of the left circumflex and right coronary artery in few weeks.  Diagnostic Dominance: Right Intervention     __________  2D echo 02/15/2021: 1. Left ventricular ejection fraction, by estimation, is 30 to 35%. The  left ventricle has moderately decreased function. The left ventricle  demonstrates regional wall motion abnormalities (see scoring  diagram/findings for description). There is mild  left ventricular hypertrophy. Left ventricular diastolic parameters are  consistent with Grade I diastolic dysfunction (impaired relaxation).  Elevated left atrial pressure.   2. Right ventricular systolic function is normal. The right ventricular  size is normal.   3. A small pericardial effusion is present.   4. The mitral valve is degenerative. Trivial mitral valve regurgitation.  No evidence of mitral stenosis.   5. The aortic valve was not well visualized. Aortic valve regurgitation  is trivial. No aortic stenosis is present.   Patient Profile     87 y.o. female with history of HTN admitted on 02/14/2021 with an anterior STEMI s/p PCI/DES to the LAD as above.   Assessment & Plan    1. Anterior STEMI: -Chest pain free following PCI to the LAD -There is residual multivessel CAD as outlined above with recommendation to proceed with staged PCI as an outpatient in a few weeks -Continue DAPT without interruption for at least 12 months with ASA and Brilinta -Echo as above -Aggressive risk factor modification -Post cath instructions -If ecchymosis of the right wrist/hand becomes sore, can use an ice pack, elevate right wrist  -Cardiac rehab -No plans for LifeVest given advanced age and underlying cognitive impairment, though is  she more alert and oriented today  2 . Acute HFrEF secondary to ICM: -She appears euvolemic -Echo as above -Continue Coreg -Relative hypotension and frail state with underlying cognitive impairment preclude addition of ACEi/ARB at this time -Escalate GDMT as tolerated  -CHF education  3. HTN: -Blood pressure overall stable to mildly soft -Coreg as above  4. HLD: -LDL 76 in 08/2020 with goal being < 70 -Lipitor 80 mg -Follow up fasting lipid panel and LFT in the outpatient setting in ~ 8 weeks with recommendation to escalate lipid therapy as needed to achieve a  target LDL of < 70  Ok for transfer to the floor today   For questions or updates, please contact [place] Please consult [email or web address] for contact info under Cardiology/STEMI.    Signed, Christell Faith, PA-C [place] Pager: [phone] 02/16/2021, 9:09 AM

## 2021-02-16 NOTE — Progress Notes (Addendum)
PROGRESS NOTE    Tanya Roy  [id number] DOB: March 17, 1929 DOA: 02/14/2021 PCP: Derinda Late, MD   Chief complaint.  Chest pain. Brief Narrative:  Tanya Roy is a 87 y.o. female with medical history significant for osteoarthritis and osteoporosis, who presented to the emergency room with acute onset of midsternal chest pain.   Initial EKG showed anterior ST elevation with reciprocal ST segment depression in inferior leads and code STEMI was activated.  She was started on IV heparin and was taken to the Cath Lab.  She had PCI and drug-eluting stent placement to the LAD by Dr. Fletcher Anon.  She will need staged PCI to the left circumflex and RCA which can be done as an outpatient.     Assessment & Plan:   Active Problems:   STEMI (ST elevation myocardial infarction) ([facility])   Acute HFrEF (heart failure with reduced ejection fraction) ([facility])  Acute ST segment elevation myocardial infarction status post PCI and LAD stent Acute systolic congestive heart failure secondary to STEMI.  LHC 02/14/2021:   Mid LAD lesion is 100% stenosed.   Prox Cx lesion is 90% stenosed.   Prox RCA lesion is 85% stenosed. Patient is status post PCI and stent to the LAD. Continue dual antiplatelet treatment for 12 months. Echocardiogram showed ejection fraction of 30 to [id number], with diastolic dysfunction.  Currently, patient does not have volume overload. Cardiology also added beta-blocker. Will monitor patient overnight, most likely will discharge home tomorrow if condition still stable.  Delirium Condition had improved after better sleep last night with Seroquel added.  Hypokalemia Repleted.  Recheck BMP and magnesium tomorrow   DVT prophylaxis: Lovenox Code Status: Full Family Communication:  Disposition Plan:    Status is: Inpatient  Remains inpatient appropriate because:Inpatient level of care appropriate due to severity of illness  Dispo: The patient is from: Home               Anticipated d/c is to: Home              Patient currently is not medically stable to d/c.   Difficult to place patient No        I/O last 3 completed shifts: In: [id number] [P.O.:650; I.V.:6] Out: [id number] [Urine:1650] Total I/O In: 100 [P.O.:100] Out: -      Consultants:  Cardiology  Procedures: Heart cath  Antimicrobials: None   Subjective: Patient doing well today, she does not have any chest pain or palpitation. No short of breath or cough. She no longer has any confusion, no agitation. No fever chills   Objective: Vitals:   02/16/21 0400 02/16/21 0600 02/16/21 0700 02/16/21 0800  BP:  114/70 (!) 111/56   Pulse: 69 67 69   Resp: 15 (!) 24 (!) 22   Temp:    98.2 F (36.8 C)  TempSrc:    Oral  SpO2: 97% 100% 98%   Weight:      Height:        Intake/Output Summary (Last 24 hours) at 02/16/2021 1420 Last data filed at 02/16/2021 0800 Gross per 24 hour  Intake 403 ml  Output 650 ml  Net -247 ml   Filed Weights   02/14/21 1416  Weight: 63.5 kg    Examination:  General exam: Appears calm and comfortable  Respiratory system: Clear to auscultation. Respiratory effort normal. Cardiovascular system: S1 & S2 heard, RRR. No JVD, murmurs, rubs, gallops or clicks. No pedal edema. Gastrointestinal system: Abdomen is nondistended, soft and nontender. No organomegaly  or masses felt. Normal bowel sounds heard. Central nervous system: Alert and oriented x2. No focal neurological deficits. Extremities: Symmetric 5 x 5 power. Skin: No rashes, lesions or ulcers Psychiatry: Judgement and insight appear normal. Mood & affect appropriate.     Data Reviewed: I have personally reviewed following labs and imaging studies  CBC: Recent Labs  Lab 02/14/21 1536 02/15/21 0553 02/16/21 0426  WBC 14.9* 12.0* 11.3*  NEUTROABS 12.7*  --   --   HGB 14.0 14.4 14.0  HCT 41.2 40.8 41.7  MCV 98.3 93.8 95.9  PLT 189 197 0000000   Basic Metabolic Panel: Recent Labs  Lab 02/14/21 1536  02/15/21 0553 02/16/21 0426  NA 137 138 138  K 3.6 3.5 3.4*  CL 104 105 104  CO2 '24 23 26  '$ GLUCOSE 142* 112* 100*  BUN '21 22 21  '$ CREATININE 0.91 0.79 0.93  CALCIUM 8.7* 8.8* 8.8*  MG  --   --  2.2   GFR: Estimated Creatinine Clearance: 38.4 mL/min (by C-G formula based on SCr of 0.93 mg/dL). Liver Function Tests: Recent Labs  Lab 02/14/21 1536  AST 76*  ALT 21  ALKPHOS 67  BILITOT 1.0  PROT 6.1*  ALBUMIN 3.8   No results for input(s): LIPASE, AMYLASE in the last 168 hours. No results for input(s): AMMONIA in the last 168 hours. Coagulation Profile: Recent Labs  Lab 02/14/21 1400  INR 1.0   Cardiac Enzymes: No results for input(s): CKTOTAL, CKMB, CKMBINDEX, TROPONINI in the last 168 hours. BNP (last 3 results) No results for input(s): PROBNP in the last 8760 hours. HbA1C: No results for input(s): HGBA1C in the last 72 hours. CBG: Recent Labs  Lab 02/14/21 1523  GLUCAP 136*   Lipid Profile: No results for input(s): CHOL, HDL, LDLCALC, TRIG, CHOLHDL, LDLDIRECT in the last 72 hours. Thyroid Function Tests: No results for input(s): TSH, T4TOTAL, FREET4, T3FREE, THYROIDAB in the last 72 hours. Anemia Panel: No results for input(s): VITAMINB12, FOLATE, FERRITIN, TIBC, IRON, RETICCTPCT in the last 72 hours. Sepsis Labs: No results for input(s): PROCALCITON, LATICACIDVEN in the last 168 hours.  Recent Results (from the past 240 hour(s))  Resp Panel by RT-PCR (Flu A&B, Covid) Nasopharyngeal Swab     Status: None   Collection Time: 02/14/21  2:42 PM   Specimen: Nasopharyngeal Swab; Nasopharyngeal(NP) swabs in vial transport medium  Result Value Ref Range Status   SARS Coronavirus 2 by RT PCR NEGATIVE NEGATIVE Final    Comment: (NOTE) SARS-CoV-2 target nucleic acids are NOT DETECTED.  The SARS-CoV-2 RNA is generally detectable in upper respiratory specimens during the acute phase of infection. The lowest concentration of SARS-CoV-2 viral copies this assay can  detect is 138 copies/mL. A negative result does not preclude SARS-Cov-2 infection and should not be used as the sole basis for treatment or other patient management decisions. A negative result may occur with  improper specimen collection/handling, submission of specimen other than nasopharyngeal swab, presence of viral mutation(s) within the areas targeted by this assay, and inadequate number of viral copies(<138 copies/mL). A negative result must be combined with clinical observations, patient history, and epidemiological information. The expected result is Negative.  Fact Sheet for Patients:  EntrepreneurPulse.com.au  Fact Sheet for Healthcare Providers:  IncredibleEmployment.be  This test is no t yet approved or cleared by the [place] FDA and  has been authorized for detection and/or diagnosis of SARS-CoV-2 by FDA under an Emergency Use Authorization (EUA). This EUA will remain  in effect (  meaning this test can be used) for the duration of the COVID-19 declaration under Section 564(b)(1) of the Act, 21 U.S.C.section 360bbb-3(b)(1), unless the authorization is terminated  or revoked sooner.       Influenza A by PCR NEGATIVE NEGATIVE Final   Influenza B by PCR NEGATIVE NEGATIVE Final    Comment: (NOTE) The Xpert Xpress SARS-CoV-2/FLU/RSV plus assay is intended as an aid in the diagnosis of influenza from Nasopharyngeal swab specimens and should not be used as a sole basis for treatment. Nasal washings and aspirates are unacceptable for Xpert Xpress SARS-CoV-2/FLU/RSV testing.  Fact Sheet for Patients: EntrepreneurPulse.com.au  Fact Sheet for Healthcare Providers: IncredibleEmployment.be  This test is not yet approved or cleared by the [place] FDA and has been authorized for detection and/or diagnosis of SARS-CoV-2 by FDA under an Emergency Use Authorization (EUA). This EUA will remain in  effect (meaning this test can be used) for the duration of the COVID-19 declaration under Section 564(b)(1) of the Act, 21 U.S.C. section 360bbb-3(b)(1), unless the authorization is terminated or revoked.  Performed at [facility], [address] [place], [place] [address]          Radiology Studies: ECHOCARDIOGRAM COMPLETE  Result Date: 02/15/2021    ECHOCARDIOGRAM REPORT   Patient Name:   [name] Date of Exam: 02/15/2021 Medical Rec #:  [id number]         Height:       66.0 in Accession #:    [id number]        Weight:       140.0 lb Date of Birth:  12-21-1928        BSA:          1.718 m Patient Age:    3 years          BP:           138/81 mmHg Patient Gender: F                 HR:           65 bpm. Exam Location:  [facility] Procedure: 2D Echo, Cardiac Doppler and Color Doppler Indications:     Acute myocardial infarction-unspecified I21.9  History:         Patient has no prior history of Echocardiogram examinations. No                  past heart history in file.  Sonographer:     Sherrie Sport Referring Phys:  [id number] Kathrin Ruddy ORGEL Diagnosing Phys: Nelva Bush MD  Sonographer Comments: Suboptimal apical window. IMPRESSIONS  1. Left ventricular ejection fraction, by estimation, is 30 to 35%. The left ventricle has moderately decreased function. The left ventricle demonstrates regional wall motion abnormalities (see scoring diagram/findings for description). There is mild left ventricular hypertrophy. Left ventricular diastolic parameters are consistent with Grade I diastolic dysfunction (impaired relaxation). Elevated left atrial pressure.  2. Right ventricular systolic function is normal. The right ventricular size is normal.  3. A small pericardial effusion is present.  4. The mitral valve is degenerative. Trivial mitral valve regurgitation. No evidence of mitral stenosis.  5. The aortic valve was not well visualized. Aortic valve regurgitation is trivial. No aortic  stenosis is present. FINDINGS  Left Ventricle: Left ventricular ejection fraction, by estimation, is 30 to 35%. The left ventricle has moderately decreased function. The left ventricle demonstrates regional wall motion abnormalities. The left ventricular internal cavity size was normal in size.  There is mild left ventricular hypertrophy. Left ventricular diastolic parameters are consistent with Grade I diastolic dysfunction (impaired relaxation). Elevated left atrial pressure.  LV Wall Scoring: The mid and distal anterior wall, mid and distal anterior septum, mid inferoseptal segment, apical inferior segment, and apex are akinetic. The entire lateral wall, inferior wall, basal anteroseptal segment, basal anterior segment, and basal inferoseptal segment are normal. Right Ventricle: The right ventricular size is normal. No increase in right ventricular wall thickness. Right ventricular systolic function is normal. Left Atrium: Left atrial size was normal in size. Right Atrium: Right atrial size was normal in size. Pericardium: A small pericardial effusion is present. Mitral Valve: The mitral valve is degenerative in appearance. There is mild thickening of the mitral valve leaflet(s). Trivial mitral valve regurgitation. No evidence of mitral valve stenosis. Tricuspid Valve: The tricuspid valve is not well visualized. Tricuspid valve regurgitation is trivial. Aortic Valve: The aortic valve was not well visualized. Aortic valve regurgitation is trivial. No aortic stenosis is present. Aortic valve mean gradient measures 2.0 mmHg. Aortic valve peak gradient measures 3.0 mmHg. Aortic valve area, by VTI measures 3.09 cm. Pulmonic Valve: The pulmonic valve was grossly normal. Pulmonic valve regurgitation is not visualized. No evidence of pulmonic stenosis. Aorta: The aortic root is normal in size and structure. Pulmonary Artery: The pulmonary artery is of normal size. Venous: The inferior vena cava was not well visualized.  IAS/Shunts: The interatrial septum was not well visualized.  LEFT VENTRICLE PLAX 2D LVIDd:         4.38 cm     Diastology LVIDs:         2.85 cm     LV e' medial:    3.59 cm/s LV PW:         1.25 cm     LV E/e' medial:  15.4 LV IVS:        0.89 cm     LV e' lateral:   3.92 cm/s LVOT diam:     2.00 cm     LV E/e' lateral: 14.1 LV SV:         55 LV SV Index:   32 LVOT Area:     3.14 cm  LV Volumes (MOD) LV vol d, MOD A4C: 62.7 ml LV vol s, MOD A4C: 41.4 ml LV SV MOD A4C:     62.7 ml RIGHT VENTRICLE RV Basal diam:  3.12 cm RV S prime:     14.30 cm/s TAPSE (M-mode): 3.3 cm LEFT ATRIUM           Index       RIGHT ATRIUM           Index LA diam:      3.50 cm 2.04 cm/m  RA Area:     13.40 cm LA Vol (A4C): 53.0 ml 30.84 ml/m RA Volume:   28.60 ml  16.64 ml/m  AORTIC VALVE                   PULMONIC VALVE AV Area (Vmax):    2.76 cm    PV Vmax:        0.70 m/s AV Area (Vmean):   2.73 cm    PV Peak grad:   1.9 mmHg AV Area (VTI):     3.09 cm    RVOT Peak grad: 2 mmHg AV Vmax:           86.10 cm/s AV Vmean:  65.400 cm/s AV VTI:            0.179 m AV Peak Grad:      3.0 mmHg AV Mean Grad:      2.0 mmHg LVOT Vmax:         75.70 cm/s LVOT Vmean:        56.800 cm/s LVOT VTI:          0.176 m LVOT/AV VTI ratio: 0.98  AORTA Ao Root diam: 2.70 cm MITRAL VALVE               TRICUSPID VALVE MV Area (PHT): 4.33 cm    TR Peak grad:   16.8 mmHg MV Decel Time: 175 msec    TR Vmax:        205.00 cm/s MV E velocity: 55.30 cm/s MV A velocity: 81.40 cm/s  SHUNTS MV E/A ratio:  0.68        Systemic VTI:  0.18 m                            Systemic Diam: 2.00 cm Nelva Bush MD Electronically signed by Nelva Bush MD Signature Date/Time: 02/15/2021/4:28:02 PM    Final         Scheduled Meds:  aspirin EC  81 mg Oral Daily   atorvastatin  80 mg Oral QHS   carvedilol  3.125 mg Oral BID WC   Chlorhexidine Gluconate Cloth  6 each Topical Daily   enoxaparin (LOVENOX) injection  40 mg Subcutaneous Q24H   multivitamin  with minerals  1 tablet Oral Daily   QUEtiapine  50 mg Oral QHS   sodium chloride flush  3 mL Intravenous Q12H   ticagrelor  90 mg Oral BID   Continuous Infusions:  sodium chloride       LOS: 2 days    Time spent: 23 minutes    Sharen Hones, MD [facility]   To contact the attending provider between 7A-7P or the covering provider during after hours 7P-7A, please log into the web site [email or web address] and access using universal [place] password for that web site. If you do not have the password, please call the hospital operator.  02/16/2021, 2:20 PM

## 2021-02-17 ENCOUNTER — Telehealth: Payer: Self-pay | Admitting: *Deleted

## 2021-02-17 ENCOUNTER — Other Ambulatory Visit: Payer: Self-pay

## 2021-02-17 DIAGNOSIS — I10 Essential (primary) hypertension: Secondary | ICD-10-CM

## 2021-02-17 DIAGNOSIS — R410 Disorientation, unspecified: Secondary | ICD-10-CM

## 2021-02-17 DIAGNOSIS — E782 Mixed hyperlipidemia: Secondary | ICD-10-CM

## 2021-02-17 DIAGNOSIS — E785 Hyperlipidemia, unspecified: Secondary | ICD-10-CM

## 2021-02-17 DIAGNOSIS — I420 Dilated cardiomyopathy: Secondary | ICD-10-CM

## 2021-02-17 LAB — CBC
HCT: 41.2 % (ref 36.0–46.0)
Hemoglobin: 14.2 g/dL (ref 12.0–15.0)
MCH: 33.5 pg (ref 26.0–34.0)
MCHC: 34.5 g/dL (ref 30.0–36.0)
MCV: 97.2 fL (ref 80.0–100.0)
Platelets: 173 10*3/uL (ref 150–400)
RBC: 4.24 MIL/uL (ref 3.87–5.11)
RDW: 13.3 % (ref 11.5–15.5)
WBC: 10.3 10*3/uL (ref 4.0–10.5)
nRBC: 0 % (ref 0.0–0.2)

## 2021-02-17 LAB — BASIC METABOLIC PANEL
Anion gap: 9 (ref 5–15)
BUN: 22 mg/dL (ref 8–23)
CO2: 24 mmol/L (ref 22–32)
Calcium: 8.6 mg/dL — ABNORMAL LOW (ref 8.9–10.3)
Chloride: 104 mmol/L (ref 98–111)
Creatinine, Ser: 0.72 mg/dL (ref 0.44–1.00)
GFR, Estimated: >60 mL/min (ref >60)
Glucose, Bld: 109 mg/dL — ABNORMAL HIGH (ref 70–99)
Potassium: 4 mmol/L (ref 3.5–5.1)
Sodium: 137 mmol/L (ref 135–145)

## 2021-02-17 LAB — MAGNESIUM: Magnesium: 2.1 mg/dL (ref 1.7–2.4)

## 2021-02-17 MED ORDER — CARVEDILOL 3.125 MG PO TABS
3.1250 mg | ORAL_TABLET | Freq: Two times a day (BID) | ORAL | 0 refills | Status: DC
Start: 2021-02-17 — End: 2021-03-22
  Filled 2021-02-17: qty 60, 30d supply, fill #0

## 2021-02-17 MED ORDER — TICAGRELOR 90 MG PO TABS: 90.0000 mg | ORAL_TABLET | Freq: Two times a day (BID) | ORAL | 0 refills | Status: DC

## 2021-02-17 MED ORDER — TICAGRELOR 90 MG PO TABS
90.0000 mg | ORAL_TABLET | Freq: Two times a day (BID) | ORAL | 0 refills | Status: DC
  Filled 2021-02-17: qty 60, 30d supply, fill #0

## 2021-02-17 MED ORDER — LOSARTAN POTASSIUM 25 MG PO TABS
25.0000 mg | ORAL_TABLET | Freq: Every day | ORAL | 0 refills | Status: DC
  Filled 2021-02-17: qty 30, 30d supply, fill #0

## 2021-02-17 MED ORDER — ATORVASTATIN CALCIUM 80 MG PO TABS
80.0000 mg | ORAL_TABLET | Freq: Every day | ORAL | 0 refills | Status: DC
  Filled 2021-02-17: qty 30, 30d supply, fill #0

## 2021-02-17 NOTE — Progress Notes (Signed)
Progress Note  Patient Name: Tanya Roy Date of Encounter: 02/17/2021  [facility] Cardiologist: [facility]  Subjective   Feels well, denies shortness of breath or chest pain Has not been able to ambulate much Discussed recent events with her, substernal chest pain radiating to neck with tightness on arrival, STEMI, catheterization, stent placement To the LAD Significant residual disease RCA and left circumflex  Inpatient Medications    Scheduled Meds:  aspirin EC  81 mg Oral Daily   atorvastatin  80 mg Oral QHS   carvedilol  3.125 mg Oral BID WC   Chlorhexidine Gluconate Cloth  6 each Topical Daily   enoxaparin (LOVENOX) injection  40 mg Subcutaneous Q24H   losartan  25 mg Oral Daily   multivitamin with minerals  1 tablet Oral Daily   QUEtiapine  50 mg Oral QHS   sodium chloride flush  3 mL Intravenous Q12H   ticagrelor  90 mg Oral BID   Continuous Infusions:  sodium chloride     PRN Meds: sodium chloride, acetaminophen, bisacodyl, docusate sodium, magnesium hydroxide, nitroGLYCERIN, ondansetron (ZOFRAN) IV, QUEtiapine, sodium chloride flush, traZODone   Vital Signs    Vitals:   02/17/21 0300 02/17/21 0400 02/17/21 0500 02/17/21 0600  BP: (!) 102/58 (!) 120/56 137/85 135/64  Pulse: 72 64  70  Resp: 20 18 (!) 23 17  Temp:      TempSrc:      SpO2: 96% 99%  97%  Weight:      Height:        Intake/Output Summary (Last 24 hours) at 02/17/2021 1239 Last data filed at 02/17/2021 0530 Gross per 24 hour  Intake --  Output 125 ml  Net -125 ml   Last 3 Weights 02/14/2021 08/29/2016 04/14/2015  Weight (lbs) 139 lb 15.9 oz 140 lb 128 lb  Weight (kg) 63.5 kg 63.504 kg 58.06 kg      Telemetry    Normal sinus rhythm- Personally Reviewed  ECG    - Personally Reviewed  Physical Exam   GEN: No acute distress.   Neck: No JVD Cardiac: RRR, no murmurs, rubs, or gallops.  Respiratory: Clear to auscultation bilaterally. GI: Soft, nontender, non-distended   MS: No edema; No deformity. Neuro:  Nonfocal  Psych: Normal affect   Labs    High Sensitivity Troponin:   Recent Labs  Lab 02/14/21 1400 02/14/21 1536  TROPONINIHS 184* 19,929*      Chemistry Recent Labs  Lab 02/14/21 1536 02/15/21 0553 02/16/21 0426 02/17/21 0539  NA 137 138 138 137  K 3.6 3.5 3.4* 4.0  CL 104 105 104 104  CO2 '24 23 26 24  '$ GLUCOSE 142* 112* 100* 109*  BUN '21 22 21 22  '$ CREATININE [id number] 0.79 0.93 0.72  CALCIUM 8.7* 8.8* 8.8* 8.6*  PROT 6.1*  --   --   --   ALBUMIN 3.8  --   --   --   AST 76*  --   --   --   ALT 21  --   --   --   ALKPHOS 67  --   --   --   BILITOT 1.0  --   --   --   GFRNONAA >60 >60 59* >60  ANIONGAP '9 10 8 9     '$ Hematology Recent Labs  Lab 02/15/21 0553 02/16/21 0426 02/17/21 0539  WBC 12.0* 11.3* 10.3  RBC 4.35 4.35 4.24  HGB 14.4 14.0 14.2  HCT 40.8 41.7 41.2  MCV  93.8 95.9 97.2  MCH 33.1 32.2 33.5  MCHC 35.3 33.6 34.5  RDW 13.0 13.1 13.3  PLT 197 179 173    BNPNo results for input(s): BNP, PROBNP in the last 168 hours.   DDimer No results for input(s): DDIMER in the last 168 hours.   Radiology    No results found.  Cardiac Studies   Cardiac catheterization February 14, 2021   Mid LAD lesion is 100% stenosed.   Prox Cx lesion is 90% stenosed.   Prox RCA lesion is 85% stenosed.   A drug-eluting stent was successfully placed using a STENT ONYX FRONTIER 3.0X26.   Post intervention, there is a 0% residual stenosis.   1.  Significant three-vessel coronary artery disease.  The culprit for anterior STEMI is an occluded mid LAD. 2.  Left ventricular angiography was not performed. 3.  Moderately to severely elevated left ventricular end-diastolic pressure at 30 mmHg. 4.  Successful angioplasty and drug-eluting stent placement to the mid LAD.  A second overlapped stent was used distally due to suspected distal edge dissection   Recommendations: Dual antiplatelet therapy for at least 12 months. Aggressive  treatment of risk factors. Obtain an echocardiogram to evaluate ejection fraction.  I started small dose carvedilol with plans to start an ACE inhibitor or ARB. Recommend staged PCI of the left circumflex and right coronary artery in few weeks.  Echocardiogram  1. Left ventricular ejection fraction, by estimation, is 30 to 35%. The  left ventricle has moderately decreased function. The left ventricle  demonstrates regional wall motion abnormalities (see scoring  diagram/findings for description). There is mild  left ventricular hypertrophy. Left ventricular diastolic parameters are  consistent with Grade I diastolic dysfunction (impaired relaxation).  Elevated left atrial pressure.   2. Right ventricular systolic function is normal. The right ventricular  size is normal.   3. A small pericardial effusion is present.   4. The mitral valve is degenerative. Trivial mitral valve regurgitation.  No evidence of mitral stenosis.   5. The aortic valve was not well visualized. Aortic valve regurgitation  is trivial. No aortic stenosis is present.    Patient Profile     87 y.o. female with history of HTN admitted on 02/14/2021 with an anterior STEMI s/p PCI/DES to the LAD as above.   Assessment & Plan   Anterior STEMI Stent placed to LAD Residual left circumflex and RCA disease Plan for staged PCI, several weeks continue aspirin, Brilinta Losartan 25 daily, carvedilol 3.125 twice daily Blood pressure running low to acceptable range, no further medication changes at this time Outpatient follow-up with Dr. Fletcher Anon  Cardiomyopathy, ischemic Continue beta-blocker, ARB Blood pressure limiting titration of her medications Close follow-up to determine if diuretic is needed as outpatient  Essential hypertension Medications as above  Hyperlipidemia On Lipitor 80, goal LDL less than 70  Long discussion with patient concerning discharge instructions, discussed with nursing and hospitalist  service  Total encounter time more than 35 minutes  Greater than 50% was spent in counseling and coordination of care with the patient   For questions or updates, please contact [facility] Please consult [email or web address] for contact info under        Signed, Ida Rogue, MD  02/17/2021, 12:39 PM

## 2021-02-17 NOTE — Discharge Summary (Signed)
Physician Discharge Summary  Patient ID: Tanya Roy MRN: [id number] DOB/AGE: 02/09/1929 87 y.o.  Admit date: 02/14/2021 Discharge date: 02/17/2021  Admission Diagnoses:  Discharge Diagnoses:  Principal Problem:   STEMI (ST elevation myocardial infarction) ([place]) Active Problems:   Dilated cardiomyopathy ([place])   Essential hypertension   Hyperlipidemia   Delirium   Discharged Condition: good  Hospital Course:  Tanya Roy is a 87 y.o. female with medical history significant for osteoarthritis and osteoporosis, who presented to the emergency room with acute onset of midsternal chest pain.   Initial EKG showed anterior ST elevation with reciprocal ST segment depression in inferior leads and code STEMI was activated.  She was started on IV heparin and was taken to the Cath Lab.  She had PCI and drug-eluting stent placement to the LAD by Dr. Fletcher Anon.  She will need staged PCI to the left circumflex and RCA which can be done as an outpatient.    Acute ST segment elevation myocardial infarction status post PCI and LAD stent Acute systolic congestive heart failure secondary to STEMI.   LHC 02/14/2021:   Mid LAD lesion is 100% stenosed.   Prox Cx lesion is 90% stenosed.   Prox RCA lesion is 85% stenosed. Patient is status post PCI and stent to the LAD. Continue dual antiplatelet treatment for 12 months. Echocardiogram showed ejection fraction of 30 to [id number], with diastolic dysfunction.  Currently, patient does not have volume overload. Cardiology also added beta-blocker, ARB Patient will be followed by cardiology as outpatient for subsequent intervention.  Delirium Condition had improved   Hypokalemia Repleted.     Consults: cardiology  Significant Diagnostic Studies:  Echo: Left ventricular ejection fraction, by estimation, is 30 to 35%. The left ventricle has moderately decreased function. The left ventricle demonstrates regional wall motion abnormalities (see  scoring diagram/findings for description). There is mild left ventricular hypertrophy. Left ventricular diastolic parameters are consistent with Grade I diastolic dysfunction (impaired relaxation). Elevated left atrial pressure. 1. 2. Right ventricular systolic function is normal. The right ventricular size is normal. 3. A small pericardial effusion is present. The mitral valve is degenerative. Trivial mitral valve regurgitation. No evidence of mitral stenosis. 4. The aortic valve was not well visualized. Aortic valve regurgitation is trivial. No aortic stenosis is present.  Heart cath:   Mid LAD lesion is 100% stenosed.   Prox Cx lesion is 90% stenosed.   Prox RCA lesion is 85% stenosed.   A drug-eluting stent was successfully placed using a STENT ONYX FRONTIER [id number].   Post intervention, there is a 0% residual stenosis.   1.  Significant three-vessel coronary artery disease.  The culprit for anterior STEMI is an occluded mid LAD. 2.  Left ventricular angiography was not performed. 3.  Moderately to severely elevated left ventricular end-diastolic pressure at 30 mmHg. 4.  Successful angioplasty and drug-eluting stent placement to the mid LAD.  A second overlapped stent was used distally due to suspected distal edge dissection   Recommendations: Dual antiplatelet therapy for at least 12 months. Aggressive treatment of risk factors. Obtain an echocardiogram to evaluate ejection fraction.  I started small dose carvedilol with plans to start an ACE inhibitor or ARB. Recommend staged PCI of the left circumflex and right coronary artery in few weeks.   Treatments: Heart cath  Discharge Exam: Blood pressure 139/83, pulse 67, temperature 98.1 F (36.7 C), temperature source Oral, resp. rate 15, height '5\' 6"'$  (1.676 m), weight 63.5 kg, SpO2 98 %. General appearance:  alert and cooperative Resp: clear to auscultation bilaterally Cardio: regular rate and rhythm, S1, S2 normal, no murmur,  click, rub or gallop GI: soft, non-tender; bowel sounds normal; no masses,  no organomegaly Extremities: extremities normal, atraumatic, no cyanosis or edema  Disposition: Discharge disposition: 01-Home or Self Care      Discharge Instructions     AMB Referral to Cardiac Rehabilitation - Phase II   Complete by: As directed    Diagnosis: STEMI   After initial evaluation and assessments completed: Virtual Based Care may be provided alone or in conjunction with Phase 2 Cardiac Rehab based on patient barriers.: Yes   Diet - low sodium heart healthy   Complete by: As directed    Increase activity slowly   Complete by: As directed       Allergies as of 02/17/2021   No Known Allergies      Medication List     STOP taking these medications    enoxaparin 40 MG/0.4ML injection Commonly known as: LOVENOX       TAKE these medications    aspirin EC 81 MG tablet Take 81 mg by mouth daily. What changed: Another medication with the same name was removed. Continue taking this medication, and follow the directions you see here.   atorvastatin 80 MG tablet Commonly known as: LIPITOR Take 1 tablet (80 mg total) by mouth at bedtime.   bisacodyl 10 MG suppository Commonly known as: DULCOLAX Place 1 suppository (10 mg total) rectally daily as needed for moderate constipation.   Brilinta 90 MG Tabs tablet Generic drug: ticagrelor Take 1 tablet (90 mg total) by mouth 2 (two) times daily.   carvedilol 3.125 MG tablet Commonly known as: COREG Take 1 tablet (3.125 mg total) by mouth 2 (two) times daily with a meal.   docusate sodium 100 MG capsule Commonly known as: COLACE Take 1 capsule (100 mg total) by mouth 2 (two) times daily as needed for mild constipation.   losartan 25 MG tablet Commonly known as: COZAAR Take 1 tablet (25 mg total) by mouth daily. Start taking on: February 18, 2021   methocarbamol 500 MG tablet Commonly known as: ROBAXIN Take 1 tablet (500 mg total)  by mouth every 6 (six) hours as needed for muscle spasms.   multivitamin tablet Take 1 tablet by mouth daily.   oxyCODONE 5 MG immediate release tablet Commonly known as: Oxy IR/ROXICODONE Take 1-2 tablets (5-10 mg total) by mouth every 4 (four) hours as needed for breakthrough pain ((for MODERATE breakthrough pain)).   Vitamin D (Ergocalciferol) 1.25 MG (50000 UNIT) Caps capsule Commonly known as: DRISDOL Take 50,000 Units by mouth every 7 (seven) days.        Follow-up Information     Derinda Late, MD Follow up in 1 week(s).   Specialty: Family Medicine Contact information: [address] [address] [phone]         Minna Merritts, MD Follow up in 2 week(s).   Specialty: Cardiology Contact information: [place] [address] [phone]                32 minutes Signed: Sharen Hones 02/17/2021, 2:37 PM

## 2021-02-17 NOTE — Telephone Encounter (Signed)
Mailbox full

## 2021-02-17 NOTE — Telephone Encounter (Signed)
-----   Message from [place], PA-C sent at 02/17/2021 10:10 AM EDT ----- Regarding: toc/hospital f/u PT needs toc/hospital follow-up for STEMI. Please call and arrange, Thx!

## 2021-02-17 NOTE — Telephone Encounter (Signed)
The patient is currently admitted. To scheduling to arrange for hospital follow up. Nursing to contact the patient post discharge.

## 2021-02-17 NOTE — Evaluation (Signed)
Physical Therapy Evaluation Patient Details Name: Tanya Roy MRN: [id number] DOB: 1928-12-04 Today's Date: 02/17/2021   History of Present Illness  Per MD notes, pt is a 87 y.o. female who presented to the emergency room with acute onset of midsternal chest pain. PHM includes osteoarthritis, osteoporosis, ORIF right wrist fx(2018), and right femur IM nail(2018). MD assessment includes acute ST segment elevation myocardial infarction status post PCI and LAD stent, acute systolic congestive heart failure secondary to STEMI, delerium, and hypokalemia.  Clinical Impression  Pt was pleasant and motivated to participate during the session. Pt gave good effort and vitals were stable throughout session. Pt able to perform all functional mobility with supervision to min guard. Pt demonstrated good concentric/eccentric control during transfers from/to elevated surfaces. Pt was overall steady walking with RW and no LOB. Pt will benefit from HHPT upon discharge to safely address deficits listed in patient problem list for decreased caregiver assistance and eventual return to PLOF.      Follow Up Recommendations Home health PT;Supervision for mobility/OOB    Equipment Recommendations  None recommended by PT    Recommendations for Other Services       Precautions / Restrictions Precautions Precautions: Fall Restrictions Weight Bearing Restrictions: No      Mobility  Bed Mobility Overal bed mobility: Needs Assistance Bed Mobility: Supine to Sit;Sit to Supine     Supine to sit: Supervision;HOB elevated Sit to supine: Supervision   General bed mobility comments: Verbal cues for sequencing.    Transfers Overall transfer level: Needs assistance Equipment used: Rolling walker (2 wheeled) Transfers: Sit to/from Stand Sit to Stand: Min guard         General transfer comment: Verbal cues for sequencing.  Ambulation/Gait Ambulation/Gait assistance: Min guard Gait Distance (Feet):  120 Feet Assistive device: Rolling walker (2 wheeled) Gait Pattern/deviations: Step-through pattern;Trunk flexed Gait velocity: decreased   General Gait Details: Pt was overall steady using RW with no LOB.  Stairs            Wheelchair Mobility    Modified Rankin (Stroke Patients Only)       Balance Overall balance assessment: Needs assistance Sitting-balance support: Feet unsupported;No upper extremity supported;Bilateral upper extremity supported Sitting balance-Leahy Scale: Good Sitting balance - Comments: Tendency to lean posteriorly but no LOB Postural control: Posterior lean Standing balance support: Bilateral upper extremity supported;During functional activity Standing balance-Leahy Scale: Fair Standing balance comment: Reliance on UE support through RW                             Pertinent Vitals/Pain Pain Assessment: No/denies pain    Home Living Family/patient expects to be discharged to:: Assisted living               Home Equipment: Walker - 2 wheels;Bedside commode;Shower seat Additional Comments: Wears a life alert necklace; BSC over toilet; reports that RLE shorter than the LLE so she wears a lift in her shoes    Prior Function Level of Independence: Independent with assistive device(s)         Comments: Reports walking facility distances and community distances with RW. Pt is mod I with ADLs but the facility provides meals. Reports 1 fall transfering from toilet.     Hand Dominance   Dominant Hand: Right    Extremity/Trunk Assessment   Upper Extremity Assessment Upper Extremity Assessment: Overall WFL for tasks assessed    Lower Extremity Assessment Lower Extremity Assessment:  Overall [facility] for tasks assessed    Cervical / Trunk Assessment Cervical / Trunk Assessment: Kyphotic  Communication   Communication: No difficulties  Cognition Arousal/Alertness: Awake/alert Behavior During Therapy: WFL for tasks  assessed/performed Overall Cognitive Status: Within Functional Limits for tasks assessed                                 General Comments: Mildly impulsive trying  requiring verbal cues to slow down.      General Comments      Exercises Total Joint Exercises Ankle Circles/Pumps: AROM;Strengthening;Both;10 reps;Supine Hip ABduction/ADduction: AROM;Strengthening;Both;10 reps;Supine Long Arc Quad: AROM;Strengthening;Both;10 reps;Supine Marching in Standing: AROM;Strengthening;Both;10 reps;Seated;Standing (x10 standing; x10 seated) Other Exercises Other Exercises: Static standing 1-2 min with min guard for improved activity tolerance. Other Exercises: Static sitting 1-2 min with supervision for improved trunk control and balance.   Assessment/Plan    PT Assessment Patient needs continued PT services  PT Problem List Decreased strength;Decreased activity tolerance;Decreased balance;Decreased mobility;Decreased safety awareness       PT Treatment Interventions DME instruction;Gait training;Stair training;Functional mobility training;Therapeutic activities;Therapeutic exercise;Balance training;Patient/family education    PT Goals (Current goals can be found in the Care Plan section)  Acute Rehab PT Goals Patient Stated Goal: Walk without RW PT Goal Formulation: With patient Time For Goal Achievement: 03/01/21 Potential to Achieve Goals: Good    Frequency Min 2X/week   Barriers to discharge        Co-evaluation               AM-PAC PT "6 Clicks" Mobility  Outcome Measure Help needed turning from your back to your side while in a flat bed without using bedrails?: None Help needed moving from lying on your back to sitting on the side of a flat bed without using bedrails?: A Little Help needed moving to and from a bed to a chair (including a wheelchair)?: A Little Help needed standing up from a chair using your arms (e.g., wheelchair or bedside chair)?: A  Little Help needed to walk in hospital room?: A Little Help needed climbing 3-5 steps with a railing? : A Lot 6 Click Score: 18    End of Session Equipment Utilized During Treatment: Gait belt Activity Tolerance: Patient tolerated treatment well Patient left: in bed;with call bell/phone within reach;with bed alarm set;with family/visitor present Nurse Communication: Mobility status PT Visit Diagnosis: Muscle weakness (generalized) (M62.81);Unsteadiness on feet (R26.81);History of falling (Z91.81);Difficulty in walking, not elsewhere classified (R26.2)    Time: [id number] PT Time Calculation (min) (ACUTE ONLY): 35 min   Charges:              Dayton Scrape SPT 02/17/21, 4:38 PM

## 2021-02-18 ENCOUNTER — Other Ambulatory Visit: Payer: Self-pay

## 2021-02-21 NOTE — Telephone Encounter (Signed)
Pt discharged 02/17/21.  Scheduling attempted to contact pt x 1.  VM full.  Nurse will follow up for TCM call after appt scheduled.

## 2021-02-22 NOTE — Telephone Encounter (Signed)
Unsure if this patient is aware of her follow up appointment. Her address is listed as:  [place] [address]  To scheduling to verify if the patient/ her facility is aware of this appointment.

## 2021-02-24 NOTE — Telephone Encounter (Signed)
Kela, Burki A - 02/17/2021 10:32 AM Gerringer, Wallace Cullens: Thu February 24, 2021  1:22 PM  To: Emily Filbert, RN          Message  Hello,  I do not remember who it was that I spoke with to schedule but I called [facility] and they are aware of the appointment and time for patient on 09/09.

## 2021-02-24 NOTE — Telephone Encounter (Signed)
Since the patient is currently in a facility will close this encounter.

## 2021-03-04 ENCOUNTER — Other Ambulatory Visit: Payer: Self-pay

## 2021-03-04 ENCOUNTER — Other Ambulatory Visit: Payer: Self-pay | Admitting: Medical

## 2021-03-04 ENCOUNTER — Telehealth: Payer: Self-pay | Admitting: Cardiovascular Disease

## 2021-03-04 ENCOUNTER — Encounter: Payer: Self-pay | Admitting: Medical

## 2021-03-04 ENCOUNTER — Ambulatory Visit (INDEPENDENT_AMBULATORY_CARE_PROVIDER_SITE_OTHER): Payer: Medicare Other | Admitting: Medical

## 2021-03-04 VITALS — BP 150/86 | HR 76 | Wt 135.0 lb

## 2021-03-04 DIAGNOSIS — I5022 Chronic systolic (congestive) heart failure: Secondary | ICD-10-CM

## 2021-03-04 DIAGNOSIS — I255 Ischemic cardiomyopathy: Secondary | ICD-10-CM

## 2021-03-04 DIAGNOSIS — I25810 Atherosclerosis of coronary artery bypass graft(s) without angina pectoris: Secondary | ICD-10-CM | POA: Diagnosis not present

## 2021-03-04 DIAGNOSIS — I10 Essential (primary) hypertension: Secondary | ICD-10-CM

## 2021-03-04 DIAGNOSIS — E782 Mixed hyperlipidemia: Secondary | ICD-10-CM | POA: Diagnosis not present

## 2021-03-04 MED ORDER — NITROGLYCERIN 0.4 MG SL SUBL: 0.4000 mg | SUBLINGUAL_TABLET | SUBLINGUAL | 3 refills | Status: DC | PRN

## 2021-03-04 MED ORDER — ENTRESTO 24-26 MG PO TABS: 1.0000 | ORAL_TABLET | Freq: Two times a day (BID) | ORAL | 5 refills | Status: DC

## 2021-03-04 MED ORDER — LOSARTAN POTASSIUM 25 MG PO TABS: 25.0000 mg | ORAL_TABLET | Freq: Every day | ORAL | Status: DC

## 2021-03-04 NOTE — Telephone Encounter (Signed)
DPR on file. Spoke with the patients sister Tanya Roy who accompanied her to the patients appt today with Tanya Roy, [place]. Tanya Roy that per [facility] Tanya Roy is not covered by the patients insurance.  Tanya Roy that Tanya has been updated. Patient  should stay on Losartan 25 mg daily for now. Other medication options will be reviewed once the patients labs drawn today are resulted and reviewed.  Patient assistance for Tanya Roy may need to be considered.  Patient resides at [facility]. Tanya Roy will call [facility] to provide the update. Tanya Roy rqst that the patients pharmacy be updated to [facility] in [place]. The Rx for [employer] has been resent to [place] as requested.  Tanya Roy voiced appreciation for the call.

## 2021-03-04 NOTE — Telephone Encounter (Signed)
[facility] calling States that Delene Loll is not covered by insurance and patient would need other options

## 2021-03-04 NOTE — Progress Notes (Signed)
Cardiology Office Note:    Date:  03/04/2021   ID:  Tanya Roy, DOB 12/03/1928, MRN [id number]  PCP:  Derinda Late, MD  [place] Electrophysiologist:  None   Referring MD: Derinda Late, MD   Chief Complaint: Hospital follow-up  History of Present Illness:    Tanya Roy is a 87 y.o. female with a hx of HTN and recent STEMI who presents for hostpial follow-up.   Recently hospitalized 02/14/21 for anterior STEMI. She underwent emergent cath showing 3V disease with 100%mLAD stenosis, 90% stenosis Lcx, and 85% RCA, moderately elevated LVEDP at 21m Hg. Patient was treated with DESx2 to LAD, plan for outpatient stage PCI to left Cx and RCA. She was started on Aspirin, Brilinta, coreg and lipitor. Echo showed EF 30-35%.   Today, the patient reports she has been going we well. She is in independent living. She lives by herself. She can care for herself ok. No chest pain or shortness of breath. Right wrist site healed well. Eating and drinking normally. Energy has been normal. NO LLE, orthopnea, pnd. Trouble with toes, they think shoes are too tight. Discussed cath and echo in detail, discussed staged PCI. Can discuss cardiac rehab at follow-up. BP elevated.  Past Medical History:  Diagnosis Date   Abnormal mammogram of right breast 2012   Microcalcifications   Arthritis    Osteoporosis     Past Surgical History:  Procedure Laterality Date   APPENDECTOMY     BREAST BIOPSY Right 2013   stereo, neg   CORONARY/GRAFT ACUTE MI REVASCULARIZATION N/A 02/14/2021   Procedure: Coronary/Graft Acute MI Revascularization;  Surgeon: AWellington Hampshire MD;  [facility];  Service: Cardiovascular;  Laterality: N/A;   FEMUR IM NAIL Right 08/29/2016   Procedure: INTRAMEDULLARY (IM) NAIL FEMORAL;  Surgeon: MHessie Knows MD;  Location: [facility];  Service: Orthopedics;  Laterality: Right;   LEFT HEART CATH AND CORONARY ANGIOGRAPHY  N/A 02/14/2021   Procedure: LEFT HEART CATH AND CORONARY ANGIOGRAPHY;  Surgeon: AWellington Hampshire MD;  [facility];  Service: Cardiovascular;  Laterality: N/A;   MELANOMA EXCISION Right 03/26/2015   Procedure: MELANOMA EXCISION;  Surgeon: CClayburn Pert MD;  Location: [facility];  Service: General;  Laterality: Right;   ORIF WRIST FRACTURE Right 08/29/2016   Procedure: OPEN REDUCTION INTERNAL FIXATION (ORIF) WRIST FRACTURE;  Surgeon: MHessie Knows MD;  Location: [facility];  Service: Orthopedics;  Laterality: Right;   SKIN BIOPSY  03/11/2015   Back    Current Medications: Current Meds  Medication Sig   aspirin EC 81 MG tablet Take 81 mg by mouth daily.   atorvastatin (LIPITOR) 80 MG tablet Take 1 tablet (80 mg total) by mouth at bedtime.   bisacodyl (DULCOLAX) 10 MG suppository Place 1 suppository (10 mg total) rectally daily as needed for moderate constipation.   carvedilol (COREG) 3.125 MG tablet Take 1 tablet (3.125 mg total) by mouth 2 (two) times daily with a meal.   docusate sodium (COLACE) 100 MG capsule Take 1 capsule (100 mg total) by mouth 2 (two) times daily as needed for mild constipation.   methocarbamol (ROBAXIN) 500 MG tablet Take 1 tablet (500 mg total) by mouth every 6 (six) hours as needed for muscle spasms.   Multiple Vitamin (MULTIVITAMIN) tablet Take 1 tablet by mouth daily.   nitroGLYCERIN (NITROSTAT) 0.4 MG SL tablet Place 1 tablet (0.4 mg total) under the tongue every 5 (five) minutes as needed for  chest pain.   oxyCODONE (OXY IR/ROXICODONE) 5 MG immediate release tablet Take 1-2 tablets (5-10 mg total) by mouth every 4 (four) hours as needed for breakthrough pain ((for MODERATE breakthrough pain)).   sacubitril-valsartan (ENTRESTO) 24-26 MG Take 1 tablet by mouth 2 (two) times daily.   ticagrelor (BRILINTA) 90 MG TABS tablet Take 1 tablet (90 mg total) by mouth 2 (two) times daily.   Vitamin D, Ergocalciferol, (DRISDOL) 50000 units CAPS capsule Take  50,000 Units by mouth every 7 (seven) days.   [DISCONTINUED] losartan (COZAAR) 25 MG tablet Take 1 tablet (25 mg total) by mouth daily.     Allergies:   Patient has no known allergies.   Social History   Socioeconomic History   Marital status: Married    Spouse name: Not on file   Number of children: Not on file   Years of education: Not on file   Highest education level: Not on file  Occupational History   Not on file  Tobacco Use   Smoking status: Never   Smokeless tobacco: Never  Substance and Sexual Activity   Alcohol use: No    Alcohol/week: 0.0 standard drinks   Drug use: No   Sexual activity: Not on file  Other Topics Concern   Not on file  Social History Narrative   Not on file   Social Determinants of Health   Financial Resource Strain: Not on file  Food Insecurity: Not on file  Transportation Needs: Not on file  Physical Activity: Not on file  Stress: Not on file  Social Connections: Not on file     Family History: The patient's family history includes Heart disease in her father; Tuberculosis in her mother. There is no history of Breast cancer.  ROS:   Please see the history of present illness.     All other systems reviewed and are negative.  EKGs/Labs/Other Studies Reviewed:    The following studies were reviewed today:  Cardiac cath 01/2021     Mid LAD lesion is 100% stenosed.   Prox Cx lesion is 90% stenosed.   Prox RCA lesion is 85% stenosed.   A drug-eluting stent was successfully placed using a STENT ONYX FRONTIER 3.0X26.   Post intervention, there is a 0% residual stenosis.   1.  Significant three-vessel coronary artery disease.  The culprit for anterior STEMI is an occluded mid LAD. 2.  Left ventricular angiography was not performed. 3.  Moderately to severely elevated left ventricular end-diastolic pressure at 30 mmHg. 4.  Successful angioplasty and drug-eluting stent placement to the mid LAD.  A second overlapped stent was used  distally due to suspected distal edge dissection   Recommendations: Dual antiplatelet therapy for at least 12 months. Aggressive treatment of risk factors. Obtain an echocardiogram to evaluate ejection fraction.  I started small dose carvedilol with plans to start an ACE inhibitor or ARB. Recommend staged PCI of the left circumflex and right coronary artery in few weeks.  Diagnostic Dominance: Right Intervention        Echo 01/2021  1. Left ventricular ejection fraction, by estimation, is 30 to 35%. The  left ventricle has moderately decreased function. The left ventricle  demonstrates regional wall motion abnormalities (see scoring  diagram/findings for description). There is mild  left ventricular hypertrophy. Left ventricular diastolic parameters are  consistent with Grade I diastolic dysfunction (impaired relaxation).  Elevated left atrial pressure.   2. Right ventricular systolic function is normal. The right ventricular  size is normal.  3. A small pericardial effusion is present.   4. The mitral valve is degenerative. Trivial mitral valve regurgitation.  No evidence of mitral stenosis.   5. The aortic valve was not well visualized. Aortic valve regurgitation  is trivial. No aortic stenosis is present.   EKG:  EKG is  ordered today.  The ekg ordered today demonstrates NSR, 76bpm, ST changes lead V2, anterolateral TWI.   Recent Labs: 02/14/2021: ALT 21 02/17/2021: BUN 22; Creatinine, Ser 0.72; Hemoglobin 14.2; Magnesium 2.1; Platelets 173; Potassium 4.0; Sodium 137  Recent Lipid Panel No results found for: CHOL, TRIG, HDL, CHOLHDL, VLDL, LDLCALC, LDLDIRECT    Physical Exam:    VS:  BP (!) 150/86 (BP Location: Left Arm, Patient Position: Sitting, Cuff Size: Normal)   Pulse 76   Wt 135 lb (61.2 kg)   SpO2 96%   BMI 21.79 kg/m     Wt Readings from Last 3 Encounters:  03/04/21 135 lb (61.2 kg)  02/14/21 139 lb 15.9 oz (63.5 kg)  08/29/16 140 lb (63.5 kg)      GEN:  Well nourished, well developed in no acute distress HEENT: Normal NECK: No JVD; No carotid bruits LYMPHATICS: No lymphadenopathy CARDIAC: RRR, no murmurs, rubs, gallops RESPIRATORY:  Clear to auscultation without rales, wheezing or rhonchi  ABDOMEN: Soft, non-tender, non-distended MUSCULOSKELETAL:  No edema; No deformity  SKIN: Warm and dry NEUROLOGIC:  Alert and oriented x 3 PSYCHIATRIC:  Normal affect   ASSESSMENT:    1. Coronary artery disease involving coronary bypass graft of native heart without angina pectoris   2. Essential hypertension   3. Hyperlipidemia, mixed   4. Ischemic cardiomyopathy   5. Chronic systolic heart failure (HCC)    PLAN:    In order of problems listed above:  Anterior STEMI CAD s/p DES x2 mLAD Recent admission for STEMI with emergent catheterization showing 3V disease with 100%mLAD stenosis, 90% stenosis Lcx, and 85% RCA, moderately elevated LVEDP at 39m Hg. She was Treated with DES x 2 to mid LAD. Echo showed LVEF 30-35%.  Patient has been doing well since discharge. She denies anginal symptoms, ambulating well without symptoms. Cath site without complications. Plan for staged PCI with Dr. AFletcher Anonfor PCI Lcx and RCA, we will schedule this. Will also give SL NTG. Continue Aspirin, Coreg, Brilinta, and statin.   HFrEF ICM Echo during admission showed LVEF 30-35%, WMA, mild LVH, G1DD, small pericardial effusion, trivial MR. She is euvolemic on exam and denies orthopnea and pnd. Stop Losartan and add Entresto. Pre-procedure labs today, can check BMET at follow-up. Continue Coreg, continue GDMT at follow-up. Recommended daily weights and low salt diet.   HTN BP elevated. Continue Coreg. [facility] Los[facility] to [facility].   HLD LDL 76 with goal less than 70 . Continue statin. Can re-check at follow-up.   Disposition: Follow up in 2-3 weeks post procedure with MD/APP   Shared Decision Making/Informed Consent   Shared Decision  Making/Informed Consent The risks [stroke (1 in 1000), death (1 in 1000), kidney failure [usually temporary] (1 in 500), bleeding (1 in 200), allergic reaction [possibly serious] (1 in 200)], benefits (diagnostic support and management of coronary artery disease) and alternatives of a cardiac catheterization were discussed in detail with Ms. SUmphenourand she is willing to proceed.    Signed, Tyyonna Soucy HNinfa Meeker PA-C  03/04/2021 12:51 PM    [facility]

## 2021-03-04 NOTE — H&P (View-Only) (Signed)
Cardiology Office Note:    Date:  03/04/2021   ID:  [name], DOB [birth year], MRN [id number]  PCP:  Derinda Late, MD  [place] Electrophysiologist:  None   Referring MD: Derinda Late, MD   Chief Complaint: Hospital follow-up  History of Present Illness:    Tanya Roy is a 87 y.o. female with a hx of HTN and recent STEMI who presents for hostpial follow-up.   Recently hospitalized 02/14/21 for anterior STEMI. She underwent emergent cath showing 3V disease with 100%mLAD stenosis, 90% stenosis Lcx, and 85% RCA, moderately elevated LVEDP at 67m Hg. Patient was treated with DESx2 to LAD, plan for outpatient stage PCI to left Cx and RCA. She was started on Aspirin, Brilinta, coreg and lipitor. Echo showed EF 30-35%.   Today, the patient reports she has been going we well. She is in independent living. She lives by herself. She can care for herself ok. No chest pain or shortness of breath. Right wrist site healed well. Eating and drinking normally. Energy has been normal. NO LLE, orthopnea, pnd. Trouble with toes, they think shoes are too tight. Discussed cath and echo in detail, discussed staged PCI. Can discuss cardiac rehab at follow-up. BP elevated.  Past Medical History:  Diagnosis Date   Abnormal mammogram of right breast 2012   Microcalcifications   Arthritis    Osteoporosis     Past Surgical History:  Procedure Laterality Date   APPENDECTOMY     BREAST BIOPSY Right 2013   stereo, neg   CORONARY/GRAFT ACUTE MI REVASCULARIZATION N/A 02/14/2021   Procedure: Coronary/Graft Acute MI Revascularization;  Surgeon: AWellington Hampshire MD;  [facility];  Service: Cardiovascular;  Laterality: N/A;   FEMUR IM NAIL Right 08/29/2016   Procedure: INTRAMEDULLARY (IM) NAIL FEMORAL;  Surgeon: MHessie Knows MD;  Location: [facility];  Service: Orthopedics;  Laterality: Right;   LEFT HEART CATH AND CORONARY ANGIOGRAPHY  N/A 02/14/2021   Procedure: LEFT HEART CATH AND CORONARY ANGIOGRAPHY;  Surgeon: AWellington Hampshire MD;  [facility];  Service: Cardiovascular;  Laterality: N/A;   MELANOMA EXCISION Right 03/26/2015   Procedure: MELANOMA EXCISION;  Surgeon: CClayburn Pert MD;  Location: [facility];  Service: General;  Laterality: Right;   ORIF WRIST FRACTURE Right 08/29/2016   Procedure: OPEN REDUCTION INTERNAL FIXATION (ORIF) WRIST FRACTURE;  Surgeon: MHessie Knows MD;  Location: [facility];  Service: Orthopedics;  Laterality: Right;   SKIN BIOPSY  03/11/2015   Back    Current Medications: Current Meds  Medication Sig   aspirin EC 81 MG tablet Take 81 mg by mouth daily.   atorvastatin (LIPITOR) 80 MG tablet Take 1 tablet (80 mg total) by mouth at bedtime.   bisacodyl (DULCOLAX) 10 MG suppository Place 1 suppository (10 mg total) rectally daily as needed for moderate constipation.   carvedilol (COREG) 3.125 MG tablet Take 1 tablet (3.125 mg total) by mouth 2 (two) times daily with a meal.   docusate sodium (COLACE) 100 MG capsule Take 1 capsule (100 mg total) by mouth 2 (two) times daily as needed for mild constipation.   methocarbamol (ROBAXIN) 500 MG tablet Take 1 tablet (500 mg total) by mouth every 6 (six) hours as needed for muscle spasms.   Multiple Vitamin (MULTIVITAMIN) tablet Take 1 tablet by mouth daily.   nitroGLYCERIN (NITROSTAT) 0.4 MG SL tablet Place 1 tablet (0.4 mg total) under the tongue every 5 (five) minutes as needed for  chest pain.   oxyCODONE (OXY IR/ROXICODONE) 5 MG immediate release tablet Take 1-2 tablets (5-10 mg total) by mouth every 4 (four) hours as needed for breakthrough pain ((for MODERATE breakthrough pain)).   sacubitril-valsartan (ENTRESTO) 24-26 MG Take 1 tablet by mouth 2 (two) times daily.   ticagrelor (BRILINTA) 90 MG TABS tablet Take 1 tablet (90 mg total) by mouth 2 (two) times daily.   Vitamin D, Ergocalciferol, (DRISDOL) 50000 units CAPS capsule Take  50,000 Units by mouth every 7 (seven) days.   [DISCONTINUED] losartan (COZAAR) 25 MG tablet Take 1 tablet (25 mg total) by mouth daily.     Allergies:   Patient has no known allergies.   Social History   Socioeconomic History   Marital status: Married    Spouse name: Not on file   Number of children: Not on file   Years of education: Not on file   Highest education level: Not on file  Occupational History   Not on file  Tobacco Use   Smoking status: Never   Smokeless tobacco: Never  Substance and Sexual Activity   Alcohol use: No    Alcohol/week: 0.0 standard drinks   Drug use: No   Sexual activity: Not on file  Other Topics Concern   Not on file  Social History Narrative   Not on file   Social Determinants of Health   Financial Resource Strain: Not on file  Food Insecurity: Not on file  Transportation Needs: Not on file  Physical Activity: Not on file  Stress: Not on file  Social Connections: Not on file     Family History: The patient's family history includes Heart disease in her father; Tuberculosis in her mother. There is no history of Breast cancer.  ROS:   Please see the history of present illness.     All other systems reviewed and are negative.  EKGs/Labs/Other Studies Reviewed:    The following studies were reviewed today:  Cardiac cath 01/2021     Mid LAD lesion is 100% stenosed.   Prox Cx lesion is 90% stenosed.   Prox RCA lesion is 85% stenosed.   A drug-eluting stent was successfully placed using a STENT ONYX FRONTIER 3.0X26.   Post intervention, there is a 0% residual stenosis.   1.  Significant three-vessel coronary artery disease.  The culprit for anterior STEMI is an occluded mid LAD. 2.  Left ventricular angiography was not performed. 3.  Moderately to severely elevated left ventricular end-diastolic pressure at 30 mmHg. 4.  Successful angioplasty and drug-eluting stent placement to the mid LAD.  A second overlapped stent was used  distally due to suspected distal edge dissection   Recommendations: Dual antiplatelet therapy for at least 12 months. Aggressive treatment of risk factors. Obtain an echocardiogram to evaluate ejection fraction.  I started small dose carvedilol with plans to start an ACE inhibitor or ARB. Recommend staged PCI of the left circumflex and right coronary artery in few weeks.  Diagnostic Dominance: Right Intervention        Echo 01/2021  1. Left ventricular ejection fraction, by estimation, is 30 to 35%. The  left ventricle has moderately decreased function. The left ventricle  demonstrates regional wall motion abnormalities (see scoring  diagram/findings for description). There is mild  left ventricular hypertrophy. Left ventricular diastolic parameters are  consistent with Grade I diastolic dysfunction (impaired relaxation).  Elevated left atrial pressure.   2. Right ventricular systolic function is normal. The right ventricular  size is normal.  3. A small pericardial effusion is present.   4. The mitral valve is degenerative. Trivial mitral valve regurgitation.  No evidence of mitral stenosis.   5. The aortic valve was not well visualized. Aortic valve regurgitation  is trivial. No aortic stenosis is present.   EKG:  EKG is  ordered today.  The ekg ordered today demonstrates NSR, 76bpm, ST changes lead V2, anterolateral TWI.   Recent Labs: 02/14/2021: ALT 21 02/17/2021: BUN 22; Creatinine, Ser 0.72; Hemoglobin 14.2; Magnesium 2.1; Platelets 173; Potassium 4.0; Sodium 137  Recent Lipid Panel No results found for: CHOL, TRIG, HDL, CHOLHDL, VLDL, LDLCALC, LDLDIRECT    Physical Exam:    VS:  BP (!) 150/86 (BP Location: Left Arm, Patient Position: Sitting, Cuff Size: Normal)   Pulse 76   Wt 135 lb (61.2 kg)   SpO2 96%   BMI 21.79 kg/m     Wt Readings from Last 3 Encounters:  03/04/21 135 lb (61.2 kg)  02/14/21 139 lb 15.9 oz (63.5 kg)  08/29/16 140 lb (63.5 kg)      GEN:  Well nourished, well developed in no acute distress HEENT: Normal NECK: No JVD; No carotid bruits LYMPHATICS: No lymphadenopathy CARDIAC: RRR, no murmurs, rubs, gallops RESPIRATORY:  Clear to auscultation without rales, wheezing or rhonchi  ABDOMEN: Soft, non-tender, non-distended MUSCULOSKELETAL:  No edema; No deformity  SKIN: Warm and dry NEUROLOGIC:  Alert and oriented x 3 PSYCHIATRIC:  Normal affect   ASSESSMENT:    1. Coronary artery disease involving coronary bypass graft of native heart without angina pectoris   2. Essential hypertension   3. Hyperlipidemia, mixed   4. Ischemic cardiomyopathy   5. Chronic systolic heart failure (HCC)    PLAN:    In order of problems listed above:  Anterior STEMI CAD s/p DES x2 mLAD Recent admission for STEMI with emergent catheterization showing 3V disease with 100%mLAD stenosis, 90% stenosis Lcx, and 85% RCA, moderately elevated LVEDP at 28m Hg. She was Treated with DES x 2 to mid LAD. Echo showed LVEF 30-35%.  Patient has been doing well since discharge. She denies anginal symptoms, ambulating well without symptoms. Cath site without complications. Plan for staged PCI with Dr. AFletcher Anonfor PCI Lcx and RCA, we will schedule this. Will also give SL NTG. Continue Aspirin, Coreg, Brilinta, and statin.   HFrEF ICM Echo during admission showed LVEF 30-35%, WMA, mild LVH, G1DD, small pericardial effusion, trivial MR. She is euvolemic on exam and denies orthopnea and pnd. Stop Losartan and add Entresto. Pre-procedure labs today, can check BMET at follow-up. Continue Coreg, continue GDMT at follow-up. Recommended daily weights and low salt diet.   HTN BP elevated. Continue Coreg. [facility] to [facility].   HLD LDL 76 with goal less than 70 . Continue statin. Can re-check at follow-up.   Disposition: Follow up in 2-3 weeks post procedure with MD/APP   Shared Decision Making/Informed Consent   Shared Decision  Making/Informed Consent The risks [stroke (1 in 1000), death (1 in 1000), kidney failure [usually temporary] (1 in 500), bleeding (1 in 200), allergic reaction [possibly serious] (1 in 200)], benefits (diagnostic support and management of coronary artery disease) and alternatives of a cardiac catheterization were discussed in detail with Tanya Roy she is willing to proceed.    Signed, Emmanuella Mirante HNinfa Meeker PA-C  03/04/2021 12:51 PM    [facility]

## 2021-03-04 NOTE — Patient Instructions (Signed)
Medication Instructions:  Your physician has recommended you make the following change in your medication:   1) STOP Losartan  2) START Entresto 24/26 mg twice a day. An Rx has been sent to your pharmacy.  3) START sublingual Nitro-glycerin as needed as directed. An Rx has been sent to your pharmacy.  Please review the use of nitro below:  If a single episode of chest pain is not relieved by one tablet, the patient will try another within 5 minutes; and if this doesn't relieve the pain, the patient is instructed to call 911 for transportation to an emergency department.   *If you need a refill on your cardiac medications before your next appointment, please call your pharmacy*   Lab Work: Bmp and Cbc today If you have labs (blood work) drawn today and your tests are completely normal, you will receive your results only by: [place] (if you have MyChart) OR A paper copy in the mail If you have any lab test that is abnormal or we need to change your treatment, we will call you to review the results.   Testing/Procedures: You are  scheduled for a PCI procedure with Dr. Fletcher Anon. Please review pre-procedure instructions below.   Follow-Up: At [facility], you and your health needs are our priority.  As part of our continuing mission to provide you with exceptional heart care, we have created designated Provider Care Teams.  These Care Teams include your primary Cardiologist (physician) and Advanced Practice Providers (APPs -  Physician Assistants and Nurse Practitioners) who all work together to provide you with the care you need, when you need it.  We recommend signing up for the patient portal called "MyChart".  Sign up information is provided on this After Visit Summary.  MyChart is used to connect with patients for Virtual Visits (Telemedicine).  Patients are able to view lab/test results, encounter notes, upcoming appointments, etc.  Non-urgent messages can be sent to your  provider as well.   To learn more about what you can do with MyChart, go to [email or web address].    Your next appointment:   3 week(s)  The format for your next appointment:   In Person  Provider:   You may see Kathlyn Sacramento, MD or one of the following Advanced Practice Providers on your designated Care Team:   Murray Hodgkins, NP Christell Faith, PA-C Marrianne Mood, PA-C Cadence Kathlen Mody, [place]   Other Instructions  [place] [address] [place] [id number] Dept: [place]: [place]  03/04/2021  You are scheduled for a PCI on Monday, September 19 with Dr. Kathlyn Sacramento.  1. Please arrive at the [facility] at 10:30 AM for check in  (This time is tone  hour before your procedure to ensure your preparation). Free valet parking service is available.   Special note: Every effort is made to have your procedure done on time. Please understand that emergencies sometimes delay scheduled procedures.  2. Diet: Do not eat solid foods after midnight.  The patient may have clear liquids until 5am upon the day of the procedure.  3. Labs: You will need to have blood drawn today (Bmp, Cbc)  4. Medication instructions in preparation for your procedure:   Contrast Allergy: No   On the morning of your procedure, take your Aspirin and Brilinta and any morning medicines NOT listed above.  You may use sips of water.  5.  Plan for one night stay--bring personal belongings. 6. Bring a current list of your medications and current insurance cards. 7. You MUST have a responsible person to drive you home. 8. Someone MUST be with you the first 24 hours after you arrive home or your discharge will be delayed. 9. Please wear clothes that are easy to get on and off and wear slip-on shoes.  Thank you for allowing [place] to care for you!   -- [facility] [facility]

## 2021-03-05 LAB — BASIC METABOLIC PANEL
BUN/Creatinine Ratio: 20 (ref 12–28)
BUN: 17 mg/dL (ref 8–27)
CO2: 21 mmol/L (ref 20–29)
Calcium: 9.9 mg/dL (ref 8.7–10.3)
Chloride: 106 mmol/L (ref 96–106)
Creatinine, Ser: 0.87 mg/dL (ref 0.57–1.00)
Glucose: 101 mg/dL — ABNORMAL HIGH (ref 65–99)
Potassium: 4.7 mmol/L (ref 3.5–5.2)
Sodium: 144 mmol/L (ref 134–144)
eGFR: 64 mL/min/{1.73_m2} (ref >59)

## 2021-03-05 LAB — CBC WITH DIFFERENTIAL/PLATELET
Basophils Absolute: 0.1 10*3/uL (ref 0.0–0.2)
Basos: 1 %
EOS (ABSOLUTE): 0.2 10*3/uL (ref 0.0–0.4)
Eos: 3 %
Hematocrit: 44.9 % (ref 34.0–46.6)
Hemoglobin: 14.9 g/dL (ref 11.1–15.9)
Immature Grans (Abs): 0 10*3/uL (ref 0.0–0.1)
Immature Granulocytes: 0 %
Lymphocytes Absolute: 1.2 10*3/uL (ref 0.7–3.1)
Lymphs: 17 %
MCH: 32.3 pg (ref 26.6–33.0)
MCHC: 33.2 g/dL (ref 31.5–35.7)
MCV: 97 fL (ref 79–97)
Monocytes Absolute: 0.6 10*3/uL (ref 0.1–0.9)
Monocytes: 9 %
Neutrophils Absolute: 4.9 10*3/uL (ref 1.4–7.0)
Neutrophils: 70 %
Platelets: 243 10*3/uL (ref 150–450)
RBC: 4.61 x10E6/uL (ref 3.77–5.28)
RDW: 12.7 % (ref 11.7–15.4)
WBC: 6.9 10*3/uL (ref 3.4–10.8)

## 2021-03-07 ENCOUNTER — Telehealth: Payer: Self-pay

## 2021-03-07 NOTE — Telephone Encounter (Signed)
Attempted to reach pt via phone, unable to make contact Mailbox "full at this time" Tanya Roy

## 2021-03-14 ENCOUNTER — Encounter: Payer: Self-pay | Admitting: Cardiovascular Disease

## 2021-03-14 ENCOUNTER — Encounter
Admission: RE | Disposition: A | Discharge status: Home / Self Care | Payer: Self-pay | Source: Home / Self Care | Attending: Cardiovascular Disease

## 2021-03-14 ENCOUNTER — Observation Stay
Admission: RE | Admit: 2021-03-14 | Discharge: 2021-03-16 | Disposition: A | Discharge status: Home / Self Care | Payer: Medicare Other | Attending: Family Medicine | Admitting: Family Medicine

## 2021-03-14 ENCOUNTER — Other Ambulatory Visit: Payer: Self-pay

## 2021-03-14 DIAGNOSIS — I252 Old myocardial infarction: Secondary | ICD-10-CM

## 2021-03-14 DIAGNOSIS — Z79899 Other long term (current) drug therapy: Secondary | ICD-10-CM | POA: Diagnosis not present

## 2021-03-14 DIAGNOSIS — I25118 Atherosclerotic heart disease of native coronary artery with other forms of angina pectoris: Secondary | ICD-10-CM

## 2021-03-14 DIAGNOSIS — I5022 Chronic systolic (congestive) heart failure: Secondary | ICD-10-CM | POA: Diagnosis not present

## 2021-03-14 DIAGNOSIS — Z955 Presence of coronary angioplasty implant and graft: Secondary | ICD-10-CM | POA: Insufficient documentation

## 2021-03-14 DIAGNOSIS — I25810 Atherosclerosis of coronary artery bypass graft(s) without angina pectoris: Secondary | ICD-10-CM

## 2021-03-14 DIAGNOSIS — M6281 Muscle weakness (generalized): Secondary | ICD-10-CM | POA: Diagnosis not present

## 2021-03-14 DIAGNOSIS — F05 Delirium due to known physiological condition: Secondary | ICD-10-CM | POA: Diagnosis not present

## 2021-03-14 DIAGNOSIS — R531 Weakness: Secondary | ICD-10-CM

## 2021-03-14 DIAGNOSIS — Z9861 Coronary angioplasty status: Secondary | ICD-10-CM

## 2021-03-14 DIAGNOSIS — I2510 Atherosclerotic heart disease of native coronary artery without angina pectoris: Principal | ICD-10-CM | POA: Diagnosis present

## 2021-03-14 DIAGNOSIS — Z7982 Long term (current) use of aspirin: Secondary | ICD-10-CM | POA: Diagnosis not present

## 2021-03-14 DIAGNOSIS — I10 Essential (primary) hypertension: Secondary | ICD-10-CM | POA: Diagnosis not present

## 2021-03-14 DIAGNOSIS — Z20822 Contact with and (suspected) exposure to covid-19: Secondary | ICD-10-CM | POA: Insufficient documentation

## 2021-03-14 DIAGNOSIS — E785 Hyperlipidemia, unspecified: Secondary | ICD-10-CM | POA: Diagnosis present

## 2021-03-14 HISTORY — PX: CORONARY STENT INTERVENTION: CATH118234

## 2021-03-14 LAB — RESP PANEL BY RT-PCR (FLU A&B, COVID) ARPGX2
Influenza A by PCR: NEGATIVE
Influenza B by PCR: NEGATIVE
SARS Coronavirus 2 by RT PCR: NEGATIVE

## 2021-03-14 SURGERY — CORONARY STENT INTERVENTION
Anesthesia: Moderate Sedation

## 2021-03-14 MED ORDER — IOHEXOL 350 MG/ML SOLN
INTRAVENOUS | Status: DC | PRN
  Administered 2021-03-14: 80 mL

## 2021-03-14 MED ORDER — SODIUM CHLORIDE 0.9 % IV SOLN: 250.0000 mL | INTRAVENOUS | Status: DC | PRN

## 2021-03-14 MED ORDER — TICAGRELOR 90 MG PO TABS
ORAL_TABLET | ORAL | Status: DC | PRN
  Administered 2021-03-14: 90 mg via ORAL

## 2021-03-14 MED ORDER — ADULT MULTIVITAMIN W/MINERALS CH
1.0000 | ORAL_TABLET | Freq: Every day | ORAL | Status: DC
  Administered 2021-03-15 – 2021-03-16 (×2): 1 via ORAL
  Filled 2021-03-14 (×2): qty 1

## 2021-03-14 MED ORDER — TICAGRELOR 90 MG PO TABS
ORAL_TABLET | ORAL | Status: AC
  Filled 2021-03-14: qty 1

## 2021-03-14 MED ORDER — VITAMIN D (ERGOCALCIFEROL) 1.25 MG (50000 UNIT) PO CAPS: 50000.0000 [IU] | ORAL_CAPSULE | ORAL | Status: DC

## 2021-03-14 MED ORDER — HEPARIN SODIUM (PORCINE) 1000 UNIT/ML IJ SOLN
INTRAMUSCULAR | Status: AC
  Filled 2021-03-14: qty 1

## 2021-03-14 MED ORDER — MIDAZOLAM HCL 2 MG/2ML IJ SOLN
INTRAMUSCULAR | Status: AC
  Filled 2021-03-14: qty 2

## 2021-03-14 MED ORDER — SODIUM CHLORIDE 0.9 % WEIGHT BASED INFUSION
1.0000 mL/kg/h | INTRAVENOUS | Status: AC
  Administered 2021-03-14: 1 mL/kg/h via INTRAVENOUS

## 2021-03-14 MED ORDER — HEPARIN SODIUM (PORCINE) 1000 UNIT/ML IJ SOLN
INTRAMUSCULAR | Status: DC | PRN
  Administered 2021-03-14: 6000 [IU] via INTRAVENOUS

## 2021-03-14 MED ORDER — SODIUM CHLORIDE 0.9% FLUSH
3.0000 mL | Freq: Two times a day (BID) | INTRAVENOUS | Status: DC
  Administered 2021-03-14 – 2021-03-16 (×4): 3 mL via INTRAVENOUS

## 2021-03-14 MED ORDER — DOCUSATE SODIUM 100 MG PO CAPS
100.0000 mg | ORAL_CAPSULE | Freq: Two times a day (BID) | ORAL | Status: DC | PRN
  Filled 2021-03-14: qty 1

## 2021-03-14 MED ORDER — ASPIRIN 81 MG PO CHEW: 81.0000 mg | CHEWABLE_TABLET | ORAL | Status: AC

## 2021-03-14 MED ORDER — METHOCARBAMOL 500 MG PO TABS
500.0000 mg | ORAL_TABLET | Freq: Four times a day (QID) | ORAL | Status: DC | PRN
  Filled 2021-03-14: qty 1

## 2021-03-14 MED ORDER — OXYCODONE HCL 5 MG PO TABS: 5.0000 mg | ORAL_TABLET | ORAL | Status: DC | PRN

## 2021-03-14 MED ORDER — HEPARIN (PORCINE) IN NACL 2000-0.9 UNIT/L-% IV SOLN
INTRAVENOUS | Status: DC | PRN
  Administered 2021-03-14: 1000 mL

## 2021-03-14 MED ORDER — TICAGRELOR 90 MG PO TABS
90.0000 mg | ORAL_TABLET | Freq: Two times a day (BID) | ORAL | Status: DC
  Administered 2021-03-14 – 2021-03-16 (×4): 90 mg via ORAL
  Filled 2021-03-14 (×4): qty 1

## 2021-03-14 MED ORDER — LIDOCAINE HCL 1 % IJ SOLN
INTRAMUSCULAR | Status: AC
  Filled 2021-03-14: qty 20

## 2021-03-14 MED ORDER — SODIUM CHLORIDE 0.9% FLUSH: 3.0000 mL | Freq: Two times a day (BID) | INTRAVENOUS | Status: DC

## 2021-03-14 MED ORDER — FENTANYL CITRATE PF 50 MCG/ML IJ SOSY
PREFILLED_SYRINGE | INTRAMUSCULAR | Status: AC
  Filled 2021-03-14: qty 1

## 2021-03-14 MED ORDER — SODIUM CHLORIDE 0.9% FLUSH: 3.0000 mL | INTRAVENOUS | Status: DC | PRN

## 2021-03-14 MED ORDER — ONDANSETRON HCL 4 MG/2ML IJ SOLN: 4.0000 mg | Freq: Four times a day (QID) | INTRAMUSCULAR | Status: DC | PRN

## 2021-03-14 MED ORDER — VERAPAMIL HCL 2.5 MG/ML IV SOLN
INTRAVENOUS | Status: AC
  Filled 2021-03-14: qty 2

## 2021-03-14 MED ORDER — NITROGLYCERIN 0.4 MG SL SUBL: 0.4000 mg | SUBLINGUAL_TABLET | SUBLINGUAL | Status: DC | PRN

## 2021-03-14 MED ORDER — SODIUM CHLORIDE 0.9 % WEIGHT BASED INFUSION: 1.0000 mL/kg/h | INTRAVENOUS | Status: DC

## 2021-03-14 MED ORDER — ATORVASTATIN CALCIUM 80 MG PO TABS
80.0000 mg | ORAL_TABLET | Freq: Every day | ORAL | Status: DC
  Administered 2021-03-14 – 2021-03-15 (×2): 80 mg via ORAL
  Filled 2021-03-14 (×2): qty 1

## 2021-03-14 MED ORDER — ACETAMINOPHEN 325 MG PO TABS: 650.0000 mg | ORAL_TABLET | ORAL | Status: DC | PRN

## 2021-03-14 MED ORDER — ASPIRIN 81 MG PO CHEW
CHEWABLE_TABLET | ORAL | Status: AC
  Administered 2021-03-14: 81 mg via ORAL
  Filled 2021-03-14: qty 1

## 2021-03-14 MED ORDER — SODIUM CHLORIDE 0.9 % WEIGHT BASED INFUSION
3.0000 mL/kg/h | INTRAVENOUS | Status: DC
  Administered 2021-03-14: 3 mL/kg/h via INTRAVENOUS

## 2021-03-14 MED ORDER — HEPARIN (PORCINE) IN NACL 1000-0.9 UT/500ML-% IV SOLN
INTRAVENOUS | Status: AC
  Filled 2021-03-14: qty 1000

## 2021-03-14 MED ORDER — VERAPAMIL HCL 2.5 MG/ML IV SOLN
INTRAVENOUS | Status: DC | PRN
  Administered 2021-03-14: 2.5 mg via INTRA_ARTERIAL

## 2021-03-14 MED ORDER — ASPIRIN EC 81 MG PO TBEC
81.0000 mg | DELAYED_RELEASE_TABLET | Freq: Every day | ORAL | Status: DC
  Administered 2021-03-15 – 2021-03-16 (×2): 81 mg via ORAL
  Filled 2021-03-14 (×2): qty 1

## 2021-03-14 MED ORDER — CARVEDILOL 3.125 MG PO TABS
3.1250 mg | ORAL_TABLET | Freq: Two times a day (BID) | ORAL | Status: DC
  Administered 2021-03-14 – 2021-03-16 (×4): 3.125 mg via ORAL
  Filled 2021-03-14 (×4): qty 1

## 2021-03-14 MED ORDER — SACUBITRIL-VALSARTAN 24-26 MG PO TABS
1.0000 | ORAL_TABLET | Freq: Two times a day (BID) | ORAL | Status: DC
  Administered 2021-03-14 – 2021-03-16 (×4): 1 via ORAL
  Filled 2021-03-14 (×4): qty 1

## 2021-03-14 MED ORDER — BISACODYL 10 MG RE SUPP
10.0000 mg | Freq: Every day | RECTAL | Status: DC | PRN
  Filled 2021-03-14: qty 1

## 2021-03-14 SURGICAL SUPPLY — 22 items
BALLN EUPHORA RX 2.5X10 (BALLOONS) ×2
BALLN ~~LOC~~ EUPHORA RX 3.5X20 (BALLOONS) ×2
BALLN ~~LOC~~ TREK RX 3.5X8 (BALLOONS) ×2
BALLOON EUPHORA RX 2.5X10 (BALLOONS) ×1 IMPLANT
BALLOON ~~LOC~~ EUPHORA RX 3.5X20 (BALLOONS) ×1 IMPLANT
BALLOON ~~LOC~~ TREK RX 3.5X8 (BALLOONS) ×1 IMPLANT
CATH LAUNCHER 6FR EBU3.5 (CATHETERS) ×2 IMPLANT
CATH VISTA GUIDE 6FR JR4 (CATHETERS) ×2 IMPLANT
DEVICE RAD TR BAND REGULAR (VASCULAR PRODUCTS) ×2 IMPLANT
DRAPE BRACHIAL (DRAPES) ×2 IMPLANT
GLIDESHEATH SLEND SS 6F .021 (SHEATH) ×2 IMPLANT
GUIDEWIRE INQWIRE 1.5J.035X260 (WIRE) ×1 IMPLANT
INQWIRE 1.5J .035X260CM (WIRE) ×2
KIT ENCORE 26 ADVANTAGE (KITS) ×2 IMPLANT
PACK CARDIAC CATH (CUSTOM PROCEDURE TRAY) ×2 IMPLANT
PROTECTION STATION PRESSURIZED (MISCELLANEOUS) ×2
SET ATX SIMPLICITY (MISCELLANEOUS) ×2 IMPLANT
STATION PROTECTION PRESSURIZED (MISCELLANEOUS) ×1 IMPLANT
STENT ONYX FRONTIER 3.0X38 (Permanent Stent) ×2 IMPLANT
STENT ONYX FRONTIER 3.5X15 (Permanent Stent) ×2 IMPLANT
TUBING CIL FLEX 10 FLL-RA (TUBING) ×2 IMPLANT
WIRE RUNTHROUGH .014X180CM (WIRE) ×2 IMPLANT

## 2021-03-14 NOTE — Progress Notes (Signed)
Pt. Remains confused; not aware of hospital, year,  or president . Aware of current month. Noted that pt. Was "picking" at TR band and slight ooze noted from under TR band. 2 more ml added to TR band for total of 11 ml now.

## 2021-03-14 NOTE — Progress Notes (Signed)
Spoke with MD in person re: pt. Confusion. Pt. Sister [name] in room with MD and RN. Discussion made re: pt. "Has been confusion since she was here last month. It seems to be getting worse." Pt. Lives in independent living at [facility]. MD states he will speak with social worker on floor to try to expedite pt. Moving over to assisted living from independent living secondary to new onset of confusion since STEMI 02/14/21. Telephone consent obtained from [place] , sister Jackelyn Poling.

## 2021-03-14 NOTE — Interval H&P Note (Signed)
Cath Lab Visit (complete for each Cath Lab visit)  Clinical Evaluation Leading to the Procedure:   ACS: Yes.   Staged PCI post STEMI  Non-ACS:  n/a    History and Physical Interval Note:  03/14/2021 11:23 AM  Tanya Roy  has presented today for surgery, with the diagnosis of PCI    CAD.  The various methods of treatment have been discussed with the patient and family. After consideration of risks, benefits and other options for treatment, the patient has consented to  Procedure(s): CORONARY STENT INTERVENTION (N/A) as a surgical intervention.  The patient's history has been reviewed, patient examined, no change in status, stable for surgery.  I have reviewed the patient's chart and labs.  Questions were answered to the patient's satisfaction.     Kathlyn Sacramento

## 2021-03-15 ENCOUNTER — Encounter: Payer: Self-pay | Admitting: Cardiovascular Disease

## 2021-03-15 DIAGNOSIS — I5022 Chronic systolic (congestive) heart failure: Secondary | ICD-10-CM

## 2021-03-15 DIAGNOSIS — I2510 Atherosclerotic heart disease of native coronary artery without angina pectoris: Secondary | ICD-10-CM | POA: Diagnosis not present

## 2021-03-15 DIAGNOSIS — I255 Ischemic cardiomyopathy: Secondary | ICD-10-CM | POA: Diagnosis not present

## 2021-03-15 DIAGNOSIS — R531 Weakness: Secondary | ICD-10-CM

## 2021-03-15 DIAGNOSIS — Z9861 Coronary angioplasty status: Secondary | ICD-10-CM

## 2021-03-15 DIAGNOSIS — I252 Old myocardial infarction: Secondary | ICD-10-CM

## 2021-03-15 LAB — BASIC METABOLIC PANEL
Anion gap: 9 (ref 5–15)
BUN: 16 mg/dL (ref 8–23)
CO2: 22 mmol/L (ref 22–32)
Calcium: 8.4 mg/dL — ABNORMAL LOW (ref 8.9–10.3)
Chloride: 107 mmol/L (ref 98–111)
Creatinine, Ser: 0.74 mg/dL (ref 0.44–1.00)
GFR, Estimated: >60 mL/min (ref >60)
Glucose, Bld: 102 mg/dL — ABNORMAL HIGH (ref 70–99)
Potassium: 3.4 mmol/L — ABNORMAL LOW (ref 3.5–5.1)
Sodium: 138 mmol/L (ref 135–145)

## 2021-03-15 LAB — CBC
HCT: 39.3 % (ref 36.0–46.0)
Hemoglobin: 13.7 g/dL (ref 12.0–15.0)
MCH: 34.1 pg — ABNORMAL HIGH (ref 26.0–34.0)
MCHC: 34.9 g/dL (ref 30.0–36.0)
MCV: 97.8 fL (ref 80.0–100.0)
Platelets: 141 10*3/uL — ABNORMAL LOW (ref 150–400)
RBC: 4.02 MIL/uL (ref 3.87–5.11)
RDW: 13.5 % (ref 11.5–15.5)
WBC: 9.1 10*3/uL (ref 4.0–10.5)
nRBC: 0 % (ref 0.0–0.2)

## 2021-03-15 LAB — POCT ACTIVATED CLOTTING TIME
Activated Clotting Time: 393 seconds
Activated Clotting Time: 578 seconds

## 2021-03-15 MED ORDER — ACETAMINOPHEN 325 MG PO TABS: 650.0000 mg | ORAL_TABLET | Freq: Four times a day (QID) | ORAL | Status: DC | PRN

## 2021-03-15 MED ORDER — ENOXAPARIN SODIUM 40 MG/0.4ML IJ SOSY
40.0000 mg | PREFILLED_SYRINGE | INTRAMUSCULAR | Status: DC
  Administered 2021-03-15: 40 mg via SUBCUTANEOUS
  Filled 2021-03-15: qty 0.4

## 2021-03-15 MED ORDER — ACETAMINOPHEN 650 MG RE SUPP: 650.0000 mg | Freq: Four times a day (QID) | RECTAL | Status: DC | PRN

## 2021-03-15 MED ORDER — POTASSIUM CHLORIDE CRYS ER 20 MEQ PO TBCR
40.0000 meq | EXTENDED_RELEASE_TABLET | Freq: Once | ORAL | Status: AC
  Administered 2021-03-15: 40 meq via ORAL
  Filled 2021-03-15: qty 2

## 2021-03-15 NOTE — Progress Notes (Addendum)
Progress Note  Patient Name: Tanya Roy Date of Encounter: 03/15/2021  Primary Cardiologist: Dr. Fletcher Anon  Subjective   No chest pain or shortness of breath.  Joined today by her daughter.  Has not yet ambulated, which will need done prior to discharge.  Inpatient Medications    Scheduled Meds:  aspirin EC  81 mg Oral Daily   atorvastatin  80 mg Oral QHS   carvedilol  3.125 mg Oral BID WC   multivitamin with minerals  1 tablet Oral Daily   sacubitril-valsartan  1 tablet Oral BID   sodium chloride flush  3 mL Intravenous Q12H   ticagrelor  90 mg Oral BID   [START ON 03/20/2021] Vitamin D (Ergocalciferol)  50,000 Units Oral Q7 days   Continuous Infusions:  sodium chloride     PRN Meds: sodium chloride, acetaminophen, bisacodyl, docusate sodium, methocarbamol, nitroGLYCERIN, ondansetron (ZOFRAN) IV, oxyCODONE, sodium chloride flush   Vital Signs    Vitals:   03/14/21 1936 03/15/21 0018 03/15/21 0426 03/15/21 0814  BP: 119/66 123/63 132/67 140/65  Pulse: 72 69 67 64  Resp: 18 16 19 18   Temp: 98.4 F (36.9 C) 98.2 F (36.8 C)  97.6 F (36.4 C)  TempSrc: Oral   Oral  SpO2: 96% 96% 98% 99%  Weight:      Height:        Intake/Output Summary (Last 24 hours) at 03/15/2021 1110 Last data filed at 03/15/2021 1000 Gross per 24 hour  Intake 808.98 ml  Output --  Net 808.98 ml   Last 3 Weights 03/14/2021 03/04/2021 02/14/2021  Weight (lbs) 135 lb 0.9 oz 135 lb 139 lb 15.9 oz  Weight (kg) 61.26 kg 61.236 kg 63.5 kg      Telemetry    NSR, 60s to 70s- Personally Reviewed  ECG    NSR, 68 bpm, LAFB, poor R wave progression in V1 - V3, ST/T changes in anterior lateral leads seen on prior EKGs- Personally Reviewed  Physical Exam   GEN: No acute distress.  Joined by her daughter. Neck: No JVD Cardiac: RRR, no murmurs, rubs, or gallops.  radial cath site without active bleeding and ecchymosis noted without TTP. Respiratory: Clear to auscultation bilaterally. GI:  Soft, nontender, non-distended  MS: No edema; No deformity. Neuro:  Nonfocal  Psych: Normal affect   Labs    High Sensitivity Troponin:   Recent Labs  Lab 02/14/21 1400 02/14/21 1536  TROPONINIHS 184* 19,929*      Chemistry Recent Labs  Lab 03/15/21 0432  NA 138  K 3.4*  CL 107  CO2 22  GLUCOSE 102*  BUN 16  CREATININE 0.74  CALCIUM 8.4*  GFRNONAA >60  ANIONGAP 9     Hematology Recent Labs  Lab 03/15/21 0627  WBC 9.1  RBC 4.02  HGB 13.7  HCT 39.3  MCV 97.8  MCH 34.1*  MCHC 34.9  RDW 13.5  PLT 141*    BNPNo results for input(s): BNP, PROBNP in the last 168 hours.   DDimer No results for input(s): DDIMER in the last 168 hours.   Radiology    CARDIAC CATHETERIZATION  Result Date: 03/14/2021   Prox Cx lesion is 90% stenosed.   Prox RCA lesion is 85% stenosed.   Non-stenotic Mid LAD lesion was previously treated.   A drug-eluting stent was successfully placed using a STENT ONYX FRONTIER 3.5X15.   A drug-eluting stent was successfully placed using a STENT ONYX FRONTIER 3.0X38.   Post intervention, there is a  0% residual stenosis.   Post intervention, there is a 0% residual stenosis. Successful angioplasty and drug-eluting stent placement to both left circumflex and right coronary artery. Recommendations: Continue dual antiplatelet therapy as planned for 1 year. The patient has not been able to take care of herself at independent living.  I consulted case management for assisted living placement.    Cardiac Studies   PCI 03/15/21 Coronary Diagrams   Diagnostic Dominance: Right Intervention     Prox Cx lesion is 90% stenosed.   Prox RCA lesion is 85% stenosed.   Non-stenotic Mid LAD lesion was previously treated.   A drug-eluting stent was successfully placed using a STENT ONYX FRONTIER 3.5X15.   A drug-eluting stent was successfully placed using a STENT ONYX FRONTIER 3.0X38.   Post intervention, there is a 0% residual stenosis.   Post intervention,  there is a 0% residual stenosis.   Successful angioplasty and drug-eluting stent placement to both left circumflex and right coronary artery. Recommendations: Continue dual antiplatelet therapy as planned for 1 year. The patient has not been able to take care of herself at independent living.  I consulted case management for assisted living placement. _____________   [facility] 02/14/2021:   Mid LAD lesion is 100% stenosed.   Prox Cx lesion is 90% stenosed.   Prox RCA lesion is 85% stenosed.   A drug-eluting stent was successfully placed using a STENT ONYX FRONTIER 3.0X26.   Post intervention, there is a 0% residual stenosis.   1.  Significant three-vessel coronary artery disease.  The culprit for anterior STEMI is an occluded mid LAD. 2.  Left ventricular angiography was not performed. 3.  Moderately to severely elevated left ventricular end-diastolic pressure at 30 mmHg. 4.  Successful angioplasty and drug-eluting stent placement to the mid LAD.  A second overlapped stent was used distally due to suspected distal edge dissection   Recommendations: Dual antiplatelet therapy for at least 12 months. Aggressive treatment of risk factors. Obtain an echocardiogram to evaluate ejection fraction.  I started small dose carvedilol with plans to start an ACE inhibitor or ARB. Recommend staged PCI of the left circumflex and right coronary artery in few weeks.   Diagnostic Dominance: Right Intervention       __________   2D echo 02/15/2021: 1. Left ventricular ejection fraction, by estimation, is 30 to 35%. The  left ventricle has moderately decreased function. The left ventricle  demonstrates regional wall motion abnormalities (see scoring  diagram/findings for description). There is mild  left ventricular hypertrophy. Left ventricular diastolic parameters are  consistent with Grade I diastolic dysfunction (impaired relaxation).  Elevated left atrial pressure.   2. Right ventricular systolic  function is normal. The right ventricular  size is normal.   3. A small pericardial effusion is present.   4. The mitral valve is degenerative. Trivial mitral valve regurgitation.  No evidence of mitral stenosis.   5. The aortic valve was not well visualized. Aortic valve regurgitation  is trivial. No aortic stenosis is present.   Patient Profile     87 y.o. female with history of CAD s/p prior STEMI s/p PCI/DES with recent staged PCI to LAD/RCA 03/14/21, HFrEF, essential hypertension, and HLD, and seen today after staged PCI.  Assessment & Plan    History of CAD s/p staged PCI -Chest pain free following staged PCI to the LAD and RCA.  Previous echo as above with EF 30 to 35%.  Continue DAPT without interruption for at least 12 months with  ASA 81 mg daily and Brilinta 90 mg twice daily.  Continue beta-blocker, statin, and as needed sublingual nitro as needed for CP.  Recommend aggressive risk factor modification. Post cath instructions added to her AVS with arteriotomy site inspected today and stable.Marland Kitchen  She will need cardiac rehab and follow-up in the office within 1 to 2 weeks of discharge.  Update: She ambulated with the nursing staff and felt to be unstable while using her walker.  She does needs evaluation by PT prior to discharge.  If PT does not feel patient is stable and recommends that the patient overnight, we will transition from cardiology to hospitalist service at that time. Will hold off on any discharge orders at this time.    Chronic HFrEF secondary to ICM: -She appears euvolemic on exam.  Previous echo with EF 30 to 35%.  Continue carvedilol 3.125 mg twice daily.  Continue Entresto 24-26 twice daily.  Discontinue previous ARB. Escalate GDMT as tolerated in the outpatient setting, including addition of spironolactone/SGLT2 inhibitor if tolerated in the future.  Of note, she is currently not on standing diuretic with no indication to initiate one today.  Continue to reassess volume  at  outpatient follow-up.  CHF education.  Recommendations regarding timing of discharge as above.  Hypokalemia --Replete with goal 4.0.   HTN: --Continue current medications.Discontinue ARB.   HLD: -Continue high intensity statin.    For questions or updates, please contact [place] Please consult [email or web address] for contact info under        Signed, Arvil Chaco, PA-C  03/15/2021, 11:10 AM

## 2021-03-15 NOTE — H&P (Addendum)
History and Physical    TELICIA HODGKISS AYT:[id number] DOB: 13-Sep-1928 DOA: 03/14/2021  Referring MD/NP/PA:   PCP: Derinda Late, MD   Patient coming from:  The patient is coming from home.  At baseline, pt is independent for most of ADL.        Chief Complaint: Generalized weakness s/p PCI  HPI: Tanya Roy is a 87 y.o. female with medical history significant of STEMI, s/p PCI stents, HFrEF(30 to 35%), osteoporosis, had STEMI last month, LHC showed 3 vessel CAD, drug-eluting stent placed to mid LAD on 02/15/2021. Pt returned to hospital for elective LHC yesterday. She underwent LHC with drug-eluting stents placement to both left circumflex and right coronary artery. Planned to discharge home, however patient has some delirium last night.  Today she is very weak and difficulty ambulating due to generalized weakness.  Hospitalist is called for admission.  Patient denies fever, chills, cough, chest pain, palpitation, abdominal pain, nausea, vomiting, diarrhea or dysuria.   Vital signs stable. labs no leukocytosis, K 3.4.  Otherwise unremarkable.  Review of Systems:   General: no fevers, chills, no body weight gain, generalized weakness  HEENT: no blurry vision, hearing changes or sore throat Respiratory: no dyspnea, coughing, wheezing CV: no chest pain, no palpitations GI: no nausea, vomiting, abdominal pain, diarrhea, constipation GU: no dysuria, burning on urination, increased urinary frequency, hematuria  Ext: no leg edema Neuro: no unilateral weakness, numbness, or tingling, no vision change or hearing loss Skin: no rash, no skin tear. MSK: No muscle spasm, no deformity, no limitation of range of movement in spin Heme: No easy bruising.  Travel history: No recent long distant travel.  Allergy: No Known Allergies  Past Medical History:  Diagnosis Date   Abnormal mammogram of right breast 06/26/2010   Microcalcifications   Arthritis    Confusion 02/14/2021   start  before STEMI acc. to sister [name]   Osteoporosis     Past Surgical History:  Procedure Laterality Date   APPENDECTOMY     BREAST BIOPSY Right 2013   stereo, neg   CORONARY STENT INTERVENTION N/A 03/14/2021   Procedure: CORONARY STENT INTERVENTION;  Surgeon: Wellington Hampshire, MD;  Location: [facility];  Service: Cardiovascular;  Laterality: N/A;   CORONARY/GRAFT ACUTE MI REVASCULARIZATION N/A 02/14/2021   Procedure: Coronary/Graft Acute MI Revascularization;  Surgeon: Wellington Hampshire, MD;  Location: [facility];  Service: Cardiovascular;  Laterality: N/A;   FEMUR IM NAIL Right 08/29/2016   Procedure: INTRAMEDULLARY (IM) NAIL FEMORAL;  Surgeon: Hessie Knows, MD;  Location: [facility];  Service: Orthopedics;  Laterality: Right;   LEFT HEART CATH AND CORONARY ANGIOGRAPHY N/A 02/14/2021   Procedure: LEFT HEART CATH AND CORONARY ANGIOGRAPHY;  Surgeon: Wellington Hampshire, MD;  Location: [facility];  Service: Cardiovascular;  Laterality: N/A;   MELANOMA EXCISION Right 03/26/2015   Procedure: MELANOMA EXCISION;  Surgeon: Clayburn Pert, MD;  Location: [facility];  Service: General;  Laterality: Right;   ORIF WRIST FRACTURE Right 08/29/2016   Procedure: OPEN REDUCTION INTERNAL FIXATION (ORIF) WRIST FRACTURE;  Surgeon: Hessie Knows, MD;  Location: [facility];  Service: Orthopedics;  Laterality: Right;   SKIN BIOPSY  03/11/2015   Back    Social History:  reports that she has never smoked. She has never used smokeless tobacco. She reports that she does not drink alcohol and does not use drugs.  Family History:  Family History  Problem Relation Age of Onset   Tuberculosis Mother  Heart disease Father    Breast cancer Neg Hx      Prior to Admission medications   Medication Sig Start Date End Date Taking? Authorizing Provider  aspirin EC 81 MG tablet Take 81 mg by mouth daily.   Yes [provider]  atorvastatin (LIPITOR) 80 MG tablet Take 1 tablet (80 mg total) by  mouth at bedtime. 02/17/21  Yes Sharen Hones, MD  carvedilol (COREG) 3.125 MG tablet Take 1 tablet (3.125 mg total) by mouth 2 (two) times daily with a meal. 02/17/21  Yes Sharen Hones, MD  losartan (COZAAR) 25 MG tablet Take 1 tablet (25 mg total) by mouth daily. 03/04/21 06/02/21 Yes Furth, Cadence H, PA-C  Multiple Vitamin (MULTIVITAMIN) tablet Take 1 tablet by mouth daily.   Yes [provider]  sacubitril-valsartan (ENTRESTO) 24-26 MG Take 1 tablet by mouth 2 (two) times daily.   Yes [provider]  ticagrelor (BRILINTA) 90 MG TABS tablet Take 1 tablet (90 mg total) by mouth 2 (two) times daily. 02/17/21  Yes Minna Merritts, MD  Vitamin D, Ergocalciferol, (DRISDOL) 50000 units CAPS capsule Take 50,000 Units by mouth every 7 (seven) days.   Yes [provider]  bisacodyl (DULCOLAX) 10 MG suppository Place 1 suppository (10 mg total) rectally daily as needed for moderate constipation. Patient not taking: Reported on 03/14/2021 09/01/16   Epifanio Lesches, MD  docusate sodium (COLACE) 100 MG capsule Take 1 capsule (100 mg total) by mouth 2 (two) times daily as needed for mild constipation. Patient not taking: Reported on 03/14/2021 09/01/16   Epifanio Lesches, MD  methocarbamol (ROBAXIN) 500 MG tablet Take 1 tablet (500 mg total) by mouth every 6 (six) hours as needed for muscle spasms. Patient not taking: Reported on 03/14/2021 09/01/16   Epifanio Lesches, MD  nitroGLYCERIN (NITROSTAT) 0.4 MG SL tablet Place 1 tablet (0.4 mg total) under the tongue every 5 (five) minutes as needed for chest pain. Patient not taking: Reported on 03/14/2021 03/04/21 06/02/21  Furth, Cadence H, PA-C  oxyCODONE (OXY IR/ROXICODONE) 5 MG immediate release tablet Take 1-2 tablets (5-10 mg total) by mouth every 4 (four) hours as needed for breakthrough pain ((for MODERATE breakthrough pain)). Patient not taking: Reported on 03/14/2021 09/01/16   Epifanio Lesches, MD    Physical Exam: Vitals:    03/15/21 0018 03/15/21 0426 03/15/21 0814 03/15/21 1618  BP: 123/63 132/67 140/65 121/81  Pulse: 69 67 64 68  Resp: 16 19 18 18   Temp: 62.5 F (36.8 C)  97.6 F (36.4 C) 98 F (36.7 C)  TempSrc:   Oral Oral  SpO2: 96% 98% 99% 98%  Weight:      Height:       General: Not in acute distress HEENT:       Eyes: PERRL, EOMI, no scleral icterus.       ENT: No discharge from the ears and nose.        Neck: No JVD, no bruit, no mass felt. Heme: No neck lymph node enlargement. Cardiac: S1/S2, RRR, No murmurs, No gallops or rubs. Respiratory: CTA, No rales, wheezing, rhonchi or rubs. GI: Soft, nondistended, nontender, no rebound pain, no organomegaly, BS present. GU: No hematuria Ext: No pitting leg edema bilaterally. 2+DP/PT pulse bilaterally. Musculoskeletal: No joint deformities, No joint redness or warmth, no limitation of ROM in spin. Skin: No rashes.  Neuro: Alert, oriented X3, cranial nerves II-XII grossly intact, no FND Psych: Patient is not psychotic, no suicidal or hemocidal ideation.  Labs  on Admission: I have personally reviewed following labs and imaging studies  CBC: Recent Labs  Lab 03/15/21 0627  WBC 9.1  HGB 13.7  HCT 39.3  MCV 97.8  PLT 341*   Basic Metabolic Panel: Recent Labs  Lab 03/15/21 0432  NA 138  K 3.4*  CL 107  CO2 22  GLUCOSE 102*  BUN 16  CREATININE 0.74  CALCIUM 8.4*   GFR: Estimated Creatinine Clearance: 44.6 mL/min (by C-G formula based on SCr of 0.74 mg/dL). Liver Function Tests: No results for input(s): AST, ALT, ALKPHOS, BILITOT, PROT, ALBUMIN in the last 168 hours. No results for input(s): LIPASE, AMYLASE in the last 168 hours. No results for input(s): AMMONIA in the last 168 hours. Coagulation Profile: No results for input(s): INR, PROTIME in the last 168 hours. Cardiac Enzymes: No results for input(s): CKTOTAL, CKMB, CKMBINDEX, TROPONINI in the last 168 hours. BNP (last 3 results) No results for input(s): PROBNP in the  last 8760 hours. HbA1C: No results for input(s): HGBA1C in the last 72 hours. CBG: No results for input(s): GLUCAP in the last 168 hours. Lipid Profile: No results for input(s): CHOL, HDL, LDLCALC, TRIG, CHOLHDL, LDLDIRECT in the last 72 hours. Thyroid Function Tests: No results for input(s): TSH, T4TOTAL, FREET4, T3FREE, THYROIDAB in the last 72 hours. Anemia Panel: No results for input(s): VITAMINB12, FOLATE, FERRITIN, TIBC, IRON, RETICCTPCT in the last 72 hours. Urine analysis:    Component Value Date/Time   COLORURINE AMBER (A) 02/15/2021 1109   APPEARANCEUR CLOUDY (A) 02/15/2021 1109   LABSPEC 1.030 02/15/2021 1109   PHURINE 6.0 02/15/2021 1109   GLUCOSEU NEGATIVE 02/15/2021 1109   [place] 02/15/2021 1109   [place] 02/15/2021 1109   [place] 02/15/2021 1109   PROTEINUR NEGATIVE 02/15/2021 1109   NITRITE NEGATIVE 02/15/2021 1109   LEUKOCYTESUR SMALL (A) 02/15/2021 1109   Sepsis Labs: @LABRCNTIP (procalcitonin:4,lacticidven:4) ) Recent Results (from the past 240 hour(s))  Resp Panel by RT-PCR (Flu A&B, Covid) Nasopharyngeal Swab     Status: None   Collection Time: 03/14/21  6:11 PM   Specimen: Nasopharyngeal Swab; Nasopharyngeal(NP) swabs in vial transport medium  Result Value Ref Range Status   SARS Coronavirus 2 by RT PCR NEGATIVE NEGATIVE Final    Comment: (NOTE) SARS-CoV-2 target nucleic acids are NOT DETECTED.  The SARS-CoV-2 RNA is generally detectable in upper respiratory specimens during the acute phase of infection. The lowest concentration of SARS-CoV-2 viral copies this assay can detect is 138 copies/mL. A negative result does not preclude SARS-Cov-2 infection and should not be used as the sole basis for treatment or other patient management decisions. A negative result may occur with  improper specimen collection/handling, submission of specimen other than nasopharyngeal swab, presence of viral mutation(s) within the areas  targeted by this assay, and inadequate number of viral copies(<138 copies/mL). A negative result must be combined with clinical observations, patient history, and epidemiological information. The expected result is Negative.  Fact Sheet for Patients:  EntrepreneurPulse.com.au  Fact Sheet for Healthcare Providers:  IncredibleEmployment.be  This test is no t yet approved or cleared by the [place] FDA and  has been authorized for detection and/or diagnosis of SARS-CoV-2 by FDA under an Emergency Use Authorization (EUA). This EUA will remain  in effect (meaning this test can be used) for the duration of the COVID-19 declaration under Section 564(b)(1) of the Act, 21 U.S.C.section 360bbb-3(b)(1), unless the authorization is terminated  or revoked sooner.       Influenza A  by PCR NEGATIVE NEGATIVE Final   Influenza B by PCR NEGATIVE NEGATIVE Final    Comment: (NOTE) The Xpert Xpress SARS-CoV-2/FLU/RSV plus assay is intended as an aid in the diagnosis of influenza from Nasopharyngeal swab specimens and should not be used as a sole basis for treatment. Nasal washings and aspirates are unacceptable for Xpert Xpress SARS-CoV-2/FLU/RSV testing.  Fact Sheet for Patients: EntrepreneurPulse.com.au  Fact Sheet for Healthcare Providers: IncredibleEmployment.be  This test is not yet approved or cleared by the [place] FDA and has been authorized for detection and/or diagnosis of SARS-CoV-2 by FDA under an Emergency Use Authorization (EUA). This EUA will remain in effect (meaning this test can be used) for the duration of the COVID-19 declaration under Section 564(b)(1) of the Act, 21 U.S.C. section 360bbb-3(b)(1), unless the authorization is terminated or revoked.  Performed at [facility], [address], [place], [place] [address]      Radiological Exams on Admission: CARDIAC  CATHETERIZATION  Result Date: 03/14/2021   Prox Cx lesion is 90% stenosed.   Prox RCA lesion is 85% stenosed.   Non-stenotic Mid LAD lesion was previously treated.   A drug-eluting stent was successfully placed using a STENT ONYX FRONTIER 3.5X15.   A drug-eluting stent was successfully placed using a STENT ONYX FRONTIER 3.0X38.   Post intervention, there is a 0% residual stenosis.   Post intervention, there is a 0% residual stenosis. Successful angioplasty and drug-eluting stent placement to both left circumflex and right coronary artery. Recommendations: Continue dual antiplatelet therapy as planned for 1 year. The patient has not been able to take care of herself at independent living.  I consulted case management for assisted living placement.     EKG: Independently reviewed.    LHC 03/15/2021 Prox Cx lesion is 90% stenosed.   Prox RCA lesion is 85% stenosed.   Non-stenotic Mid LAD lesion was previously treated.   A drug-eluting stent was successfully placed using a STENT ONYX FRONTIER 3.5X15.   A drug-eluting stent was successfully placed using a STENT ONYX FRONTIER 3.0X38.   Post intervention, there is a 0% residual stenosis.   Post intervention, there is a 0% residual stenosis.   Successful angioplasty and drug-eluting stent placement to both left circumflex and right coronary artery. Recommendations: Continue dual antiplatelet therapy as planned for 1 year.  LHC 02/14/2021:   Mid LAD lesion is 100% stenosed.   Prox Cx lesion is 90% stenosed.   Prox RCA lesion is 85% stenosed.   A drug-eluting stent was successfully placed using a STENT ONYX FRONTIER 3.0X26.   Post intervention, there is a 0% residual stenosis.   1.  Significant three-vessel coronary artery disease.  The culprit for anterior STEMI is an occluded mid LAD. 2.  Left ventricular angiography was not performed. 3.  Moderately to severely elevated left ventricular end-diastolic pressure at 30 mmHg. 4.  Successful  angioplasty and drug-eluting stent placement to the mid LAD.  A second overlapped stent was used distally due to suspected distal edge dissection.  2D echo 02/15/2021: 1. Left ventricular ejection fraction, by estimation, is 30 to 35%. The  left ventricle has moderately decreased function. The left ventricle  demonstrates regional wall motion abnormalities (see scoring  diagram/findings for description). There is mild  left ventricular hypertrophy. Left ventricular diastolic parameters are  consistent with Grade I diastolic dysfunction (impaired relaxation).  Elevated left atrial pressure.   2. Right ventricular systolic function is normal. The right ventricular  size is normal.  3. A small pericardial effusion is present.   4. The mitral valve is degenerative. Trivial mitral valve regurgitation.  No evidence of mitral stenosis.   5. The aortic valve was not well visualized. Aortic valve regurgitation  is trivial. No aortic stenosis is present.   Assessment/Plan Principal Problem:   CAD S/P percutaneous coronary angioplasty Active Problems:   Essential hypertension   Hyperlipidemia   History of ST elevation myocardial infarction (STEMI)   Chronic HFrEF (heart failure with reduced ejection fraction) (HCC)   CAD in native artery   Generalized weakness   Tanya Roy is a 87 y.o. female with medical history significant of STEMI, s/p PCI stents, HFrEF(30 to 35%), osteoporosis, had STEMI last month, LHC showed 3 vessel CAD, drug-eluting stent placed to mid LAD on 02/15/2021. Pt returned to hospital for elective LHC yesterday. She underwent LHC with drug-eluting stents placement to both left circumflex and right coronary artery. Admit to hospitalist service due to generalized weakness post PCI  Generalized weakness --Due to recent heart attack and procedures --No focal neurodeficit, no signs of infection --Physical therapy evaluation  Mild hypokalemia -- K 3.4.   -- replete and  recheck in AM    CAD, recent STEMI last month S/P drug-eluting stents to mid LAD on 02/15/2021, to LCX and RCA on 03/15/2021. HFrEF - EF 30 to 35% --dual antiplatelet therapy as planned for 1 year --c/w carvedilol and Entresto --c/w statin   DVT ppx: SQ Lovenox Code Status: Full code Family Communication: sister at bed side.         Disposition Plan: Depending on PT evaluation Consults called: Cardiology Admission status: Obs / tele        Date of Service 03/15/2021    [facility]   If 7PM-7AM, please contact night-coverage [email or web address] Password [facility] 03/15/2021, 5:45 PM

## 2021-03-15 NOTE — Progress Notes (Signed)
Pt pending discharge. Instructed nursing staff to ambulate before discharge.  Informed by nursing staff that pt is unable to ambulate successfully as unsteady with walker.   Informed RN that PT order will be placed and pt to be assess before discharge.   If needs to stay at [facility] another day, will transfer from cardiology service to inpatient service at that time.  Pending PT evaluation.

## 2021-03-15 NOTE — Evaluation (Signed)
Occupational Therapy Evaluation Patient Details Name: Tanya Roy MRN: [id number] DOB: 13-Dec-1928 Today's Date: 03/15/2021   History of Present Illness Pt is a 87 y.o. s/p PCI 03/14/21 due to CAD. PMH includes MI s/p post PCI, LAD stent, CHF, osteoporsis, ORIF R wrist fx (2018), R femur IM nail (2018).   Clinical Impression   Pt was seen for OT evaluation this date. Prior to hospital admission, [place] living [place] mod indep with ADL and mobility, with staff providing meals and cleaning while sisters were assisting with medication set up in a weekly pill organizer. Per the RN and the sister, pt has had difficulty managing the pill box organizer at home recently and well as noting cognitive decline over the past month or so. Sister present for tail end of OT evaluation. Pt pleasant, alert, and oriented to self only. Able to follow simple 1 step commands with occasional verbal cues and 2+ step commands with moderate to max verbal/visual cues. Pt participated in the pill box test. Pt ultimately unable to complete within 74min time limit and was unsuccessful in accurately sorting any of the medication bottles into the appropriate timeday slots in the organizer (pt scored >26, failed). OT facilitated problem solving strategies and provided pt with visual cues to support her participation as therapeutic intervention after formal assessment was completed. Pt demonstrates impaired sequencing, STM, problem solving, safety awareness, and awareness of cognitive impairments. Pt at a high risk of falls and unable to safely manage medications at this time. Sister and pt educated in OT recommendations for follow up. Pt may benefit from skilled OT Services to address cognitive impairments and safety while minimizing falls risk and risk of functional decline or increased caregiver burden. Strongly recommend 24/7 supervision/assist at this time for safety at discharge. If family unable to provide this level of care,  may need to consider higher level of care to maximize safety and address impairments. Per chart/sister, they are waiting on an ALF bed to become available where [place] resides ([place] side).      Recommendations for follow up therapy are one component of a multi-disciplinary discharge planning process, led by the attending physician.  Recommendations may be updated based on patient status, additional functional criteria and insurance authorization.   Follow Up Recommendations  Home health OT;Supervision/Assistance - 24 hour;Other (comment) (If family unable to provide 24/7 supervision/assist for safety, pt may benefit from higher level of care to maximize safety while awaiting ALF placement.)    Equipment Recommendations  3 in 1 bedside commode    Recommendations for Other Services       Precautions / Restrictions Precautions Precautions: Fall;Other (comment) Precaution Comments: RUE 'broken wing' Restrictions Weight Bearing Restrictions: Yes RUE Weight Bearing: Partial weight bearing RUE Partial Weight Bearing Percentage or Pounds: 5-10 Other Position/Activity Restrictions: pushing, pulling, lifting 48 hrs post-op      Mobility Bed Mobility Overal bed mobility: Modified Independent             General bed mobility comments: NT, up in recliner    Transfers Overall transfer level: Needs assistance Equipment used: Rolling walker (2 wheeled) Transfers: Sit to/from Stand Sit to Stand: Min guard;Supervision         General transfer comment: pt stood from recliner x2 prior to availability of nearby assist, requiring cues for safety, pt endorsing she did not have a plan for why she was getting up. OT offered to assist pt to the bathroom but pt declined.  Balance Overall balance assessment: Needs assistance Sitting-balance support: Feet supported;Single extremity supported Sitting balance-Leahy Scale: Normal     Standing balance support: No upper extremity  supported Standing balance-Leahy Scale: Fair Standing balance comment: able to tolerate minimal challenge, not moderate                           ADL either performed or assessed with clinical judgement   ADL Overall ADL's : Needs assistance/impaired                                       General ADL Comments: Pt currently requires MIN A for LB ADL from seated position and MIN A for UB ADL tasks 2/2 RUE PWBing/"broken wing" restrictions, pt unable to maintain without max cues     Vision Baseline Vision/History: 1 Wears glasses Additional Comments: wears reading glasses     Perception     Praxis      Pertinent Vitals/Pain Pain Assessment: No/denies pain     Hand Dominance Right   Extremity/Trunk Assessment Upper Extremity Assessment Upper Extremity Assessment: Generalized weakness   Lower Extremity Assessment Lower Extremity Assessment: Generalized weakness       Communication Communication Communication: No difficulties   Cognition Arousal/Alertness: Awake/alert Behavior During Therapy: WFL for tasks assessed/performed Overall Cognitive Status: Impaired/Different from baseline Area of Impairment: Orientation;Attention;Memory;Safety/judgement;Problem solving                 Orientation Level: Disoriented to;Place;Time;Situation Current Attention Level: Selective Memory: Decreased recall of precautions;Decreased short-term memory   Safety/Judgement: Decreased awareness of safety;Decreased awareness of deficits   Problem Solving: Slow processing;Difficulty sequencing;Requires verbal cues General Comments: Per chart and pt's sister pt has demonstrated cognitive decline. Pt is able to follow 1 step commands with repeated VC/TC to attempt pill box test   General Comments       Exercises Other Exercises Other Exercises: Pt education: precautions s/p PCI for RUE with pt and sister Other Exercises: Pt instructed in use of call bell  for safety, falls prevention; pt demonstrates poor recall and insight into safety Other Exercises: Pill box test: Pt unable to perform without substantial assist required. Ultimately unable to complete under 35min (took approx 23 min) and required near constant cues to support sequencing and recall of pill instructions. Pt/sister educated in possible pharmacy options for blister packs depending on next level of care.   Shoulder Instructions      Home Living Family/patient expects to be discharged to:: Assisted living (independent living)                             Home Equipment: Gilford Rile - 2 wheels;Shower seat - built in;Bedside commode   Additional Comments: BSC over toilet      Prior Functioning/Environment Level of Independence: Independent with assistive device(s);Needs assistance  Gait / Transfers Assistance Needed: MOD I for ambulation w/ 2-RW ADL's / Homemaking Assistance Needed: ALF assist with meals, cleaning. Sisters assist with medication mgt (setting up pill box, but apparently pt has had difficulty managing the pill box recently, per sister); pt mod indep with basic ADL prior   Comments: Pt's sister also notes decreased cognition (~ 1 month) in which pt is begining to require additional support for safety.        OT Problem List: Decreased strength;Decreased cognition;Decreased  safety awareness;Decreased activity tolerance;Impaired balance (sitting and/or standing)      OT Treatment/Interventions: Self-care/ADL training;Therapeutic exercise;Therapeutic activities;Cognitive remediation/compensation;DME and/or AE instruction;Patient/family education;Balance training    OT Goals(Current goals can be found in the care plan section) Acute Rehab OT Goals Patient Stated Goal: To go home OT Goal Formulation: With patient/family Time For Goal Achievement: 03/29/21 Potential to Achieve Goals: Good ADL Goals Additional ADL Goal #1: Pt will perform seated bath with set  up and remote supervision for safety. Additional ADL Goal #2: Pt will perform morning ADL routine including dressing and grooming tasks with set up and supervision for safety.  OT Frequency: Min 2X/week   Barriers to D/C: Decreased caregiver support  sisters unable to provide 24/7       Co-evaluation              AM-PAC OT "6 Clicks" Daily Activity     Outcome Measure Help from another person eating meals?: None Help from another person taking care of personal grooming?: A Little Help from another person toileting, which includes using toliet, bedpan, or urinal?: A Little Help from another person bathing (including washing, rinsing, drying)?: A Little Help from another person to put on and taking off regular upper body clothing?: A Little Help from another person to put on and taking off regular lower body clothing?: A Little 6 Click Score: 19   End of Session Nurse Communication: Other (comment) (may want to consider moving rooms 2/2 safety)  Activity Tolerance: Patient tolerated treatment well Patient left: in chair;with chair alarm set;with call bell/phone within reach;with family/visitor present  OT Visit Diagnosis: Other abnormalities of gait and mobility (R26.89);Muscle weakness (generalized) (M62.81);Other symptoms and signs involving cognitive function                Time: 6644-0347 OT Time Calculation (min): 54 min Charges:  OT General Charges $OT Visit: 1 Visit OT Evaluation $OT Eval Moderate Complexity: 1 Mod OT Treatments $Self Care/Home Management : 38-52 mins  Ardeth Perfect., MPH, MS, OTR/L ascom [phone] 03/15/21, 5:30 PM

## 2021-03-15 NOTE — Discharge Instructions (Signed)
You have received care from [name] during this hospital stay and we look forward to continuing to provide you with excellent care in our office settings after you've left the hospital.  In order to assure a smoother transition to home following your discharge from the hospital, we will likely have you see one of our nurse practitioners or physician assistants within a few weeks of discharge.  Our advanced practice providers work closely with your physician in order to address all of your heart's needs in a timely manner.  More information about all of our providers may be found here: [email or web address]   Please plan to bring all of your prescriptions to your follow-up appointment and don't hesitate to contact us with questions or concerns.  [address], [place] [address]  Catheterization site care Refer to this sheet in the next few weeks. These instructions provide you with information on caring for yourself after your procedure. Your caregiver may also give you more specific instructions. Your treatment has been planned according to current medical practices, but problems sometimes occur. Call your caregiver if you have any problems or questions after your procedure. HOME CARE INSTRUCTIONS You may shower the day after the procedure. Remove the bandage (dressing) and gently wash the site with plain soap and water. Gently pat the site dry.  Do not apply powder or lotion to the site.  Do not submerge the affected site in water for 3 to 5 days.  Inspect the site at least twice daily.  Do not flex or bend the affected arm for 24 hours.  No lifting over 5 pounds (2.3 kg) for 5 days after your procedure.  What to expect: Any bruising will usually fade within 1 to 2 weeks.  Blood that collects in the tissue (hematoma) may be painful to the touch.  It should usually decrease in size and tenderness within 1 to 2 weeks.  SEEK IMMEDIATE MEDICAL CARE IF: You have unusual pain at the radial site.  You have redness, warmth, swelling, or pain at the radial site.  You have drainage (other than a small amount of blood on the dressing).  You have chills.  You have a fever or persistent symptoms for more than 72 hours.  You have a fever and your symptoms suddenly get worse.  Your arm becomes pale, cool, tingly, or numb.  You have heavy bleeding from the site. Hold pressure on the site.      10 Habits of Highly Healthy [place] wants to help you get well and stay well.  Live a longer, healthier life by practicing healthy habits every day.  1.  Visit your primary care provider regularly. 2.  Make time for family and friends.  Healthy relationships are important. 3.  Take medications as directed by your provider. 4.  Maintain a healthy weight and a trim waistline. 5.  Eat healthy meals and snacks, rich in fruits, vegetables, whole grains, and lean proteins. 6.  Get moving every day - aim for 150 minutes of moderate physical activity each week. 7.  Don't smoke. 8.  Avoid alcohol or drink in moderation. 9.  Manage stress through meditation or mindful relaxation. 10.  Get seven to nine hours of quality sleep each night.  Want more information on healthy habits?  To learn more about these and other healthy habits, visit [email or web address].  **PLEASE REMEMBER TO BRING ALL OF YOUR  MEDICATIONS TO EACH OF YOUR FOLLOW-UP OFFICE VISITS.

## 2021-03-15 NOTE — Evaluation (Addendum)
Physical Therapy Evaluation Patient Details Name: Tanya Roy MRN: [id number] DOB: [birth year] Today's Date: 03/15/2021  History of Present Illness  Pt is a 87 y.o. s/p PCI 03/14/21 due to CAD. PMH includes MI s/p post PCI, LAD stent, CHF, osteoporsis, ORIF R wrist fx (2018), R femur IM nail (2018).  Clinical Impression  Pt alert, cooperative, and pleasant during treatment session. Pt's sister assisted in answering questions regarding home environment and PLOF as pt is a poor historian at baseline. Pt is oriented to name, DOB, disoriented to place, time, & past events. Pt is able to follow one step commands with reiteration of task, but demonstrates poor short term memory recall. Pt required consistent cues in maintaining RUE WB precautions throughout session.  Pt appears to be at functional baseline requiring MOD I for bed mobility for increased time. Transfers w/ SUP for sit >stand without reminders for hand placement with RW from lowered surface. Ambulated w/ RW, MIN GUARD for tactile and verbal reenforcement of WB precautions w/ RW. No LOB or instability noted when walking. Outpatient PT w/ 24-7 supervision is recommended at discharge. If 24/7 supervision is not available higher levels of care are recommended. Skilled PT intervention is indicated to address deficits in function, mobility, and to return to PLOF as able.       Recommendations for follow up therapy are one component of a multi-disciplinary discharge planning process, led by the attending physician.  Recommendations may be updated based on patient status, additional functional criteria and insurance authorization.  Follow Up Recommendations Supervision/Assistance - 24 hour;Home health PT    Equipment Recommendations  None recommended by PT    Recommendations for Other Services       Precautions / Restrictions Precautions Precautions: Fall;Other (comment) Precaution Comments: R 'broken wing' Restrictions Weight Bearing  Restrictions: Yes RUE Weight Bearing: Partial weight bearing RUE Partial Weight Bearing Percentage or Pounds: 5-10 Other Position/Activity Restrictions: pushing, pulling, lifting 48 hrs post-op      Mobility  Bed Mobility Overal bed mobility: Modified Independent             General bed mobility comments: MOD I for increased time to task    Transfers Overall transfer level: Needs assistance Equipment used: Rolling walker (2 wheeled) Transfers: Sit to/from Stand Sit to Stand: Min guard;Supervision         General transfer comment: SUPERVISION sit > stand with correct placement on RW; MIN-G for stand > sit for correct hand placement and controlling decent  Ambulation/Gait Ambulation/Gait assistance: Min guard Gait Distance (Feet): 80 Feet Assistive device: Rolling walker (2 wheeled)       General Gait Details: Consistent reminders and cues for limiting excessive RUE WB on RW due to precautions, pt was able to demonstrate lighter pressure w/ repetition of cues without LOB or instability; Minimized HR < 100 bpm during amb  Stairs            Wheelchair Mobility    Modified Rankin (Stroke Patients Only)       Balance Overall balance assessment: Needs assistance Sitting-balance support: Feet supported;Single extremity supported Sitting balance-Leahy Scale: Normal       Standing balance-Leahy Scale: Fair Standing balance comment: able to tolerate minimal challenge, not moderate                             Pertinent Vitals/Pain Pain Assessment: No/denies pain    Home Living Family/patient expects to be discharged to::  Assisted living (independent living)               Home Equipment: Walker - 2 wheels;Shower seat - built in;Bedside commode Additional Comments: BSC over toilet    Prior Function Level of Independence: Independent with assistive device(s)         Comments: MOD I for ambulation w/ 2-RW w/ facility assit for meal  preparation, pt is MOD I for other ADLs per pt & pt's sister. Pt's sister also notes decreased cognition (~ 1 month) in which pt is begining to require additional support for safety.     Hand Dominance   Dominant Hand: Right    Extremity/Trunk Assessment   Upper Extremity Assessment Upper Extremity Assessment: Generalized weakness    Lower Extremity Assessment Lower Extremity Assessment: Generalized weakness       Communication   Communication: No difficulties  Cognition Arousal/Alertness: Awake/alert Behavior During Therapy: WFL for tasks assessed/performed Overall Cognitive Status: Impaired/Different from baseline Area of Impairment: Orientation;Attention;Memory;Safety/judgement                 Orientation Level: Disoriented to;Place;Time Current Attention Level: Selective Memory: Decreased recall of precautions   Safety/Judgement: Decreased awareness of safety;Decreased awareness of deficits     General Comments: Per chart and pt's sister pt has demonstrated cognitive decline. Pt is able to follow 1 step commands but unable to recall past events and precautions.      General Comments      Exercises Other Exercises Other Exercises: Pt education: precautions s/p PCI for RUE with pt and sister   Assessment/Plan    PT Assessment Patient needs continued PT services  PT Problem List Decreased strength;Decreased range of motion;Decreased activity tolerance;Decreased balance;Decreased mobility;Decreased coordination       PT Treatment Interventions Balance training;Gait training;Neuromuscular re-education;Stair training;Functional mobility training;Therapeutic activities;Therapeutic exercise    PT Goals (Current goals can be found in the Care Plan section)  Acute Rehab PT Goals Patient Stated Goal: To go home PT Goal Formulation: With patient Time For Goal Achievement: 03/29/21 Potential to Achieve Goals: Good    Frequency Min 2X/week   Barriers to  discharge Decreased caregiver support decreased cognitition w/ decreased caregiver support    Co-evaluation               AM-PAC PT "6 Clicks" Mobility  Outcome Measure Help needed turning from your back to your side while in a flat bed without using bedrails?: None Help needed moving from lying on your back to sitting on the side of a flat bed without using bedrails?: None Help needed moving to and from a bed to a chair (including a wheelchair)?: A Little Help needed standing up from a chair using your arms (e.g., wheelchair or bedside chair)?: A Little Help needed to walk in hospital room?: A Little Help needed climbing 3-5 steps with a railing? : A Little 6 Click Score: 20    End of Session Equipment Utilized During Treatment: Gait belt Activity Tolerance: Patient tolerated treatment well Patient left: in chair;with call bell/phone within reach;with chair alarm set;with family/visitor present Nurse Communication: Mobility status PT Visit Diagnosis: History of falling (Z91.81);Muscle weakness (generalized) (M62.81)    Time: 1350-1430 PT Time Calculation (min) (ACUTE ONLY): 40 min   Charges:   PT Evaluation $PT Eval Low Complexity: 1 Low PT Treatments $Therapeutic Exercise: 23-37 mins        [employer], SPT

## 2021-03-16 DIAGNOSIS — Z9861 Coronary angioplasty status: Secondary | ICD-10-CM | POA: Diagnosis not present

## 2021-03-16 DIAGNOSIS — I2510 Atherosclerotic heart disease of native coronary artery without angina pectoris: Secondary | ICD-10-CM | POA: Diagnosis not present

## 2021-03-16 LAB — CBC WITH DIFFERENTIAL/PLATELET
Abs Immature Granulocytes: 0.02 10*3/uL (ref 0.00–0.07)
Basophils Absolute: 0 10*3/uL (ref 0.0–0.1)
Basophils Relative: 1 %
Eosinophils Absolute: 0.6 10*3/uL — ABNORMAL HIGH (ref 0.0–0.5)
Eosinophils Relative: 10 %
HCT: 41.2 % (ref 36.0–46.0)
Hemoglobin: 14.2 g/dL (ref 12.0–15.0)
Immature Granulocytes: 0 %
Lymphocytes Relative: 14 %
Lymphs Abs: 0.8 10*3/uL (ref 0.7–4.0)
MCH: 32.9 pg (ref 26.0–34.0)
MCHC: 34.5 g/dL (ref 30.0–36.0)
MCV: 95.4 fL (ref 80.0–100.0)
Monocytes Absolute: 0.7 10*3/uL (ref 0.1–1.0)
Monocytes Relative: 12 %
Neutro Abs: 3.9 10*3/uL (ref 1.7–7.7)
Neutrophils Relative %: 63 %
Platelets: 157 10*3/uL (ref 150–400)
RBC: 4.32 MIL/uL (ref 3.87–5.11)
RDW: 13.3 % (ref 11.5–15.5)
WBC: 6.1 10*3/uL (ref 4.0–10.5)
nRBC: 0 % (ref 0.0–0.2)

## 2021-03-16 LAB — BASIC METABOLIC PANEL
Anion gap: 12 (ref 5–15)
BUN: 14 mg/dL (ref 8–23)
CO2: 21 mmol/L — ABNORMAL LOW (ref 22–32)
Calcium: 8.6 mg/dL — ABNORMAL LOW (ref 8.9–10.3)
Chloride: 104 mmol/L (ref 98–111)
Creatinine, Ser: 0.65 mg/dL (ref 0.44–1.00)
GFR, Estimated: >60 mL/min (ref >60)
Glucose, Bld: 94 mg/dL (ref 70–99)
Potassium: 3.2 mmol/L — ABNORMAL LOW (ref 3.5–5.1)
Sodium: 137 mmol/L (ref 135–145)

## 2021-03-16 LAB — MAGNESIUM: Magnesium: 1.9 mg/dL (ref 1.7–2.4)

## 2021-03-16 NOTE — Plan of Care (Signed)

## 2021-03-16 NOTE — Plan of Care (Signed)
Patient awake and confused throughout this night shift . Refused Lipitor, Entresto and flush her IV. Charge nurse made aware and was able to encourage the patient to take Brilinta.  Patient monitored closely    Problem: Education: Goal: Knowledge of General Education information will improve Description: Including pain rating scale, medication(s)/side effects and non-pharmacologic comfort measures Outcome: Progressing   Problem: Health Behavior/Discharge Planning: Goal: Ability to manage health-related needs will improve Outcome: Progressing   Problem: Clinical Measurements: Goal: Ability to maintain clinical measurements within normal limits will improve Outcome: Progressing Goal: Will remain free from infection Outcome: Progressing Goal: Diagnostic test results will improve Outcome: Progressing Goal: Respiratory complications will improve Outcome: Progressing Goal: Cardiovascular complication will be avoided Outcome: Progressing   Problem: Activity: Goal: Risk for activity intolerance will decrease Outcome: Progressing   Problem: Nutrition: Goal: Adequate nutrition will be maintained Outcome: Progressing   Problem: Coping: Goal: Level of anxiety will decrease Outcome: Progressing   Problem: Elimination: Goal: Will not experience complications related to bowel motility Outcome: Progressing Goal: Will not experience complications related to urinary retention Outcome: Progressing   Problem: Pain Managment: Goal: General experience of comfort will improve Outcome: Progressing   Problem: Safety: Goal: Ability to remain free from injury will improve Outcome: Progressing   Problem: Skin Integrity: Goal: Risk for impaired skin integrity will decrease Outcome: Progressing   Problem: Education: Goal: Understanding of CV disease, CV risk reduction, and recovery process will improve Outcome: Progressing Goal: Individualized Educational Video(s) Outcome: Progressing    Problem: Activity: Goal: Ability to return to baseline activity level will improve Outcome: Progressing   Problem: Cardiovascular: Goal: Ability to achieve and maintain adequate cardiovascular perfusion will improve Outcome: Progressing Goal: Vascular access site(s) Level 0-1 will be maintained Outcome: Progressing   Problem: Health Behavior/Discharge Planning: Goal: Ability to safely manage health-related needs after discharge will improve Outcome: Progressing

## 2021-03-16 NOTE — Discharge Summary (Signed)
Physician Discharge Summary  LORANA MAFFEO GEZ:[id number] DOB: [birth year] DOA: 03/14/2021  PCP: Derinda Late, MD  Admit date: 03/14/2021 Discharge date: 03/16/2021  Admitted From: Independent living  Disposition:  Independent living   Recommendations for Outpatient Follow-up:  Follow up with Dr. Baldemar Lenis PCP in 1 week Follow up with Cardiology as directed Dr. Baldemar Lenis: Please obtain BMP in one week      Home Health: PT/OT due inability to leave home safely alone Equipment/Devices: 3-in-1  Discharge Condition: Fair  CODE STATUS: FULL Diet recommendation: Cardiac  Brief/Interim Summary: Tanya Roy is an 87 y.o. F with hx CAD s/p PCI last month, sCHF EF 30-35%, recent STEMI with DeS to LAD who came to the hospital for elective LHC, post-procedure was noted to be confused.       PRINCIPAL HOSPITAL DIAGNOSIS: Delirium    Discharge Diagnoses:  Delirium This appears to be resolving.  No focal neurological deficits to suggest stroke, no symptoms to suggest infection.  Likely exacerbated by sedation for LHC.  This morning seems better.     Maintain supervision at home.  PT and OT recommended, return to home familiar environment as soon as able.  Avoid sedation medications/Beers list medications.   Coronary artery disease Chronic systolic CHF Continue Brilinta, Entresto, beta-blocker, statin, and aspirin.          Discharge Instructions  Discharge Instructions     AMB Referral to Cardiac Rehabilitation - Phase II   Complete by: As directed    Diagnosis: Coronary Stents   After initial evaluation and assessments completed: Virtual Based Care may be provided alone or in conjunction with Phase 2 Cardiac Rehab based on patient barriers.: Yes   Discharge instructions   Complete by: As directed    You were admitted for mild delirium after your heart cath.  You should avoid sedating medicines, sleeping pills, Benadryl, or Unisom.  Avoid pain medicines like  oxycodone or hydrocodone.  Resume your previous home medicines.  Make sure you are taking your aspirin and Brilinta.  Take Entresto.  Make sure you are not taking losartan.  Go see your primary care doctor in 1 week   Increase activity slowly   Complete by: As directed       Allergies as of 03/16/2021   No Known Allergies      Medication List     STOP taking these medications    bisacodyl 10 MG suppository Commonly known as: DULCOLAX   docusate sodium 100 MG capsule Commonly known as: COLACE   losartan 25 MG tablet Commonly known as: COZAAR   methocarbamol 500 MG tablet Commonly known as: ROBAXIN   oxyCODONE 5 MG immediate release tablet Commonly known as: Oxy IR/ROXICODONE       TAKE these medications    aspirin EC 81 MG tablet Take 81 mg by mouth daily.   atorvastatin 80 MG tablet Commonly known as: LIPITOR Take 1 tablet (80 mg total) by mouth at bedtime.   Brilinta 90 MG Tabs tablet Generic drug: ticagrelor Take 1 tablet (90 mg total) by mouth 2 (two) times daily.   carvedilol 3.125 MG tablet Commonly known as: COREG Take 1 tablet (3.125 mg total) by mouth 2 (two) times daily with a meal.   Entresto 24-26 MG Generic drug: sacubitril-valsartan Take 1 tablet by mouth 2 (two) times daily.   multivitamin tablet Take 1 tablet by mouth daily.   nitroGLYCERIN 0.4 MG SL tablet Commonly known as: NITROSTAT Place 1 tablet (0.4 mg total) under the  tongue every 5 (five) minutes as needed for chest pain.   Vitamin D (Ergocalciferol) 1.25 MG (50000 UNIT) Caps capsule Commonly known as: DRISDOL Take 50,000 Units by mouth every 7 (seven) days.               Durable Medical Equipment  (From admission, onward)           Start     Ordered   03/16/21 0935  DME 3-in-1  Once        03/16/21 0936            No Known Allergies  Consultations: Cardiology   Procedures/Studies: CARDIAC CATHETERIZATION  Result Date: 03/14/2021   Prox Cx  lesion is 90% stenosed.   Prox RCA lesion is 85% stenosed.   Non-stenotic Mid LAD lesion was previously treated.   A drug-eluting stent was successfully placed using a STENT ONYX FRONTIER 3.5X15.   A drug-eluting stent was successfully placed using a STENT ONYX FRONTIER 3.0X38.   Post intervention, there is a 0% residual stenosis.   Post intervention, there is a 0% residual stenosis. Successful angioplasty and drug-eluting stent placement to both left circumflex and right coronary artery. Recommendations: Continue dual antiplatelet therapy as planned for 1 year. The patient has not been able to take care of herself at independent living.  I consulted case management for assisted living placement.   CARDIAC CATHETERIZATION  Result Date: 02/14/2021   Mid LAD lesion is 100% stenosed.   Prox Cx lesion is 90% stenosed.   Prox RCA lesion is 85% stenosed.   A drug-eluting stent was successfully placed using a STENT ONYX FRONTIER 3.0X26.   Post intervention, there is a 0% residual stenosis. 1.  Significant three-vessel coronary artery disease.  The culprit for anterior STEMI is an occluded mid LAD. 2.  Left ventricular angiography was not performed. 3.  Moderately to severely elevated left ventricular end-diastolic pressure at 30 mmHg. 4.  Successful angioplasty and drug-eluting stent placement to the mid LAD.  A second overlapped stent was used distally due to suspected distal edge dissection Recommendations: Dual antiplatelet therapy for at least 12 months. Aggressive treatment of risk factors. Obtain an echocardiogram to evaluate ejection fraction.  I started small dose carvedilol with plans to start an ACE inhibitor or ARB. Recommend staged PCI of the left circumflex and right coronary artery in few weeks.   DG Chest Portable 1 View  Result Date: 02/14/2021 CLINICAL DATA:  Chest pain. EXAM: PORTABLE CHEST 1 VIEW COMPARISON:  August 29, 2016. FINDINGS: Stable cardiomegaly. No pneumothorax is noted. Mild bibasilar  interstitial densities are noted concerning for pulmonary edema. No definite pleural effusion is noted. Bony thorax is unremarkable. IMPRESSION: Stable cardiomegaly. Mild bibasilar interstitial densities are noted concerning for possible pulmonary edema. Electronically Signed   By: Marijo Conception M.D.   On: 02/14/2021 14:33   ECHOCARDIOGRAM COMPLETE  Result Date: 02/15/2021    ECHOCARDIOGRAM REPORT   Patient Name:   Tanya Roy Date of Exam: 02/15/2021 Medical Rec #:  [id number]         Height:       66.0 in Accession #:    [id number]        Weight:       140.0 lb Date of Birth:  [birth year]        BSA:          1.718 m Patient Age:    87 years  BP:           138/81 mmHg Patient Gender: F                 HR:           65 bpm. Exam Location:  [facility] Procedure: 2D Echo, Cardiac Doppler and Color Doppler Indications:     Acute myocardial infarction-unspecified I21.9  History:         Patient has no prior history of Echocardiogram examinations. No                  past heart history in file.  Sonographer:     Sherrie Sport Referring Phys:  [id number] Kathrin Ruddy ORGEL Diagnosing Phys: Nelva Bush MD  Sonographer Comments: Suboptimal apical window. IMPRESSIONS  1. Left ventricular ejection fraction, by estimation, is 30 to 35%. The left ventricle has moderately decreased function. The left ventricle demonstrates regional wall motion abnormalities (see scoring diagram/findings for description). There is mild left ventricular hypertrophy. Left ventricular diastolic parameters are consistent with Grade I diastolic dysfunction (impaired relaxation). Elevated left atrial pressure.  2. Right ventricular systolic function is normal. The right ventricular size is normal.  3. A small pericardial effusion is present.  4. The mitral valve is degenerative. Trivial mitral valve regurgitation. No evidence of mitral stenosis.  5. The aortic valve was not well visualized. Aortic valve regurgitation is trivial. No aortic  stenosis is present. FINDINGS  Left Ventricle: Left ventricular ejection fraction, by estimation, is 30 to 35%. The left ventricle has moderately decreased function. The left ventricle demonstrates regional wall motion abnormalities. The left ventricular internal cavity size was normal in size. There is mild left ventricular hypertrophy. Left ventricular diastolic parameters are consistent with Grade I diastolic dysfunction (impaired relaxation). Elevated left atrial pressure.  LV Wall Scoring: The mid and distal anterior wall, mid and distal anterior septum, mid inferoseptal segment, apical inferior segment, and apex are akinetic. The entire lateral wall, inferior wall, basal anteroseptal segment, basal anterior segment, and basal inferoseptal segment are normal. Right Ventricle: The right ventricular size is normal. No increase in right ventricular wall thickness. Right ventricular systolic function is normal. Left Atrium: Left atrial size was normal in size. Right Atrium: Right atrial size was normal in size. Pericardium: A small pericardial effusion is present. Mitral Valve: The mitral valve is degenerative in appearance. There is mild thickening of the mitral valve leaflet(s). Trivial mitral valve regurgitation. No evidence of mitral valve stenosis. Tricuspid Valve: The tricuspid valve is not well visualized. Tricuspid valve regurgitation is trivial. Aortic Valve: The aortic valve was not well visualized. Aortic valve regurgitation is trivial. No aortic stenosis is present. Aortic valve mean gradient measures 2.0 mmHg. Aortic valve peak gradient measures 3.0 mmHg. Aortic valve area, by VTI measures 3.09 cm. Pulmonic Valve: The pulmonic valve was grossly normal. Pulmonic valve regurgitation is not visualized. No evidence of pulmonic stenosis. Aorta: The aortic root is normal in size and structure. Pulmonary Artery: The pulmonary artery is of normal size. Venous: The inferior vena cava was not well visualized.  IAS/Shunts: The interatrial septum was not well visualized.  LEFT VENTRICLE PLAX 2D LVIDd:         4.38 cm     Diastology LVIDs:         2.85 cm     LV e' medial:    3.59 cm/s LV PW:         1.25 cm     LV E/e' medial:  15.4 LV IVS:        0.89 cm     LV e' lateral:   3.92 cm/s LVOT diam:     2.00 cm     LV E/e' lateral: 14.1 LV SV:         55 LV SV Index:   32 LVOT Area:     3.14 cm  LV Volumes (MOD) LV vol d, MOD A4C: 62.7 ml LV vol s, MOD A4C: 41.4 ml LV SV MOD A4C:     62.7 ml RIGHT VENTRICLE RV Basal diam:  3.12 cm RV S prime:     14.30 cm/s TAPSE (M-mode): 3.3 cm LEFT ATRIUM           Index       RIGHT ATRIUM           Index LA diam:      3.50 cm 2.04 cm/m  RA Area:     13.40 cm LA Vol (A4C): 53.0 ml 30.84 ml/m RA Volume:   28.60 ml  16.64 ml/m  AORTIC VALVE                   PULMONIC VALVE AV Area (Vmax):    2.76 cm    PV Vmax:        0.70 m/s AV Area (Vmean):   2.73 cm    PV Peak grad:   1.9 mmHg AV Area (VTI):     3.09 cm    RVOT Peak grad: 2 mmHg AV Vmax:           86.10 cm/s AV Vmean:          65.400 cm/s AV VTI:            0.179 m AV Peak Grad:      3.0 mmHg AV Mean Grad:      2.0 mmHg LVOT Vmax:         75.70 cm/s LVOT Vmean:        56.800 cm/s LVOT VTI:          0.176 m LVOT/AV VTI ratio: 0.98  AORTA Ao Root diam: 2.70 cm MITRAL VALVE               TRICUSPID VALVE MV Area (PHT): 4.33 cm    TR Peak grad:   16.8 mmHg MV Decel Time: 175 msec    TR Vmax:        205.00 cm/s MV E velocity: 55.30 cm/s MV A velocity: 81.40 cm/s  SHUNTS MV E/A ratio:  0.68        Systemic VTI:  0.18 m                            Systemic Diam: 2.00 cm Nelva Bush MD Electronically signed by Nelva Bush MD Signature Date/Time: 02/15/2021/4:28:02 PM    Final       Subjective: Feeling well.  No headache, chest pain, dyspnea, swelling.  Confusion seems better.  No focal weakness.  Speech normal.  Discharge Exam: Vitals:   03/16/21 0430 03/16/21 0738  BP: (!) 146/88 (!) 157/76  Pulse: 80 73  Resp: 16 18   Temp: 97.9 F (36.6 C) 97.6 F (36.4 C)  SpO2: 97% 98%   Vitals:   03/15/21 2003 03/16/21 0020 03/16/21 0430 03/16/21 0738  BP: 107/64 (!) 152/69 (!) 146/88 (!) 157/76  Pulse: 65 81 80 73  Resp: 18 18 16 18   Temp:  97.6 F (36.4 C) 97.6 F (36.4 C) 97.9 F (36.6 C) 97.6 F (36.4 C)  TempSrc: Oral Oral Oral Oral  SpO2: 100% 97% 97% 98%  Weight:      Height:        General: Pt is alert, awake, not in acute distress Cardiovascular: RRR, nl S1-S2, no murmurs appreciated.   No LE edema.   Respiratory: Normal respiratory rate and rhythm.  CTAB without rales or wheezes. Abdominal: Abdomen soft and non-tender.  No distension or HSM.   Neuro/Psych: Strength symmetric in upper and lower extremities.  Judgment and insight appear slightly impaired.   The results of significant diagnostics from this hospitalization (including imaging, microbiology, ancillary and laboratory) are listed below for reference.     Microbiology: Recent Results (from the past 240 hour(s))  Resp Panel by RT-PCR (Flu A&B, Covid) Nasopharyngeal Swab     Status: None   Collection Time: 03/14/21  6:11 PM   Specimen: Nasopharyngeal Swab; Nasopharyngeal(NP) swabs in vial transport medium  Result Value Ref Range Status   SARS Coronavirus 2 by RT PCR NEGATIVE NEGATIVE Final    Comment: (NOTE) SARS-CoV-2 target nucleic acids are NOT DETECTED.  The SARS-CoV-2 RNA is generally detectable in upper respiratory specimens during the acute phase of infection. The lowest concentration of SARS-CoV-2 viral copies this assay can detect is 138 copies/mL. A negative result does not preclude SARS-Cov-2 infection and should not be used as the sole basis for treatment or other patient management decisions. A negative result may occur with  improper specimen collection/handling, submission of specimen other than nasopharyngeal swab, presence of viral mutation(s) within the areas targeted by this assay, and inadequate number of  viral copies(<138 copies/mL). A negative result must be combined with clinical observations, patient history, and epidemiological information. The expected result is Negative.  Fact Sheet for Patients:  EntrepreneurPulse.com.au  Fact Sheet for Healthcare Providers:  IncredibleEmployment.be  This test is no t yet approved or cleared by the [place] FDA and  has been authorized for detection and/or diagnosis of SARS-CoV-2 by FDA under an Emergency Use Authorization (EUA). This EUA will remain  in effect (meaning this test can be used) for the duration of the COVID-19 declaration under Section 564(b)(1) of the Act, 21 U.S.C.section 360bbb-3(b)(1), unless the authorization is terminated  or revoked sooner.       Influenza A by PCR NEGATIVE NEGATIVE Final   Influenza B by PCR NEGATIVE NEGATIVE Final    Comment: (NOTE) The Xpert Xpress SARS-CoV-2/FLU/RSV plus assay is intended as an aid in the diagnosis of influenza from Nasopharyngeal swab specimens and should not be used as a sole basis for treatment. Nasal washings and aspirates are unacceptable for Xpert Xpress SARS-CoV-2/FLU/RSV testing.  Fact Sheet for Patients: EntrepreneurPulse.com.au  Fact Sheet for Healthcare Providers: IncredibleEmployment.be  This test is not yet approved or cleared by the [place] FDA and has been authorized for detection and/or diagnosis of SARS-CoV-2 by FDA under an Emergency Use Authorization (EUA). This EUA will remain in effect (meaning this test can be used) for the duration of the COVID-19 declaration under Section 564(b)(1) of the Act, 21 U.S.C. section 360bbb-3(b)(1), unless the authorization is terminated or revoked.  Performed at [facility], [address] [place], [place] [address]      Labs: BNP (last 3 results) No results for input(s): BNP in the last 8760 hours. Basic Metabolic  Panel: Recent Labs  Lab 03/15/21 0432 03/16/21 0420  NA 138 137  K  3.4* 3.2*  CL 107 104  CO2 22 21*  GLUCOSE 102* 94  BUN 16 14  CREATININE 0.74 0.65  CALCIUM 8.4* 8.6*  MG  --  1.9   Liver Function Tests: No results for input(s): AST, ALT, ALKPHOS, BILITOT, PROT, ALBUMIN in the last 168 hours. No results for input(s): LIPASE, AMYLASE in the last 168 hours. No results for input(s): AMMONIA in the last 168 hours. CBC: Recent Labs  Lab 03/15/21 0627 03/16/21 0420  WBC 9.1 6.1  NEUTROABS  --  3.9  HGB 13.7 14.2  HCT 39.3 41.2  MCV 97.8 95.4  PLT 141* 157   Cardiac Enzymes: No results for input(s): CKTOTAL, CKMB, CKMBINDEX, TROPONINI in the last 168 hours. BNP: Invalid input(s): POCBNP CBG: No results for input(s): GLUCAP in the last 168 hours. D-Dimer No results for input(s): DDIMER in the last 72 hours. Hgb A1c No results for input(s): HGBA1C in the last 72 hours. Lipid Profile No results for input(s): CHOL, HDL, LDLCALC, TRIG, CHOLHDL, LDLDIRECT in the last 72 hours. Thyroid function studies No results for input(s): TSH, T4TOTAL, T3FREE, THYROIDAB in the last 72 hours.  Invalid input(s): FREET3 Anemia work up No results for input(s): VITAMINB12, FOLATE, FERRITIN, TIBC, IRON, RETICCTPCT in the last 72 hours. Urinalysis    Component Value Date/Time   COLORURINE AMBER (A) 02/15/2021 1109   APPEARANCEUR CLOUDY (A) 02/15/2021 1109   LABSPEC 1.030 02/15/2021 1109   PHURINE 6.0 02/15/2021 1109   GLUCOSEU NEGATIVE 02/15/2021 1109   [place] 02/15/2021 1109   [place] 02/15/2021 1109   [place] 02/15/2021 1109   PROTEINUR NEGATIVE 02/15/2021 1109   NITRITE NEGATIVE 02/15/2021 1109   LEUKOCYTESUR SMALL (A) 02/15/2021 1109   Sepsis Labs Invalid input(s): PROCALCITONIN,  WBC,  LACTICIDVEN Microbiology Recent Results (from the past 240 hour(s))  Resp Panel by RT-PCR (Flu A&B, Covid) Nasopharyngeal Swab     Status: None   Collection  Time: 03/14/21  6:11 PM   Specimen: Nasopharyngeal Swab; Nasopharyngeal(NP) swabs in vial transport medium  Result Value Ref Range Status   SARS Coronavirus 2 by RT PCR NEGATIVE NEGATIVE Final    Comment: (NOTE) SARS-CoV-2 target nucleic acids are NOT DETECTED.  The SARS-CoV-2 RNA is generally detectable in upper respiratory specimens during the acute phase of infection. The lowest concentration of SARS-CoV-2 viral copies this assay can detect is 138 copies/mL. A negative result does not preclude SARS-Cov-2 infection and should not be used as the sole basis for treatment or other patient management decisions. A negative result may occur with  improper specimen collection/handling, submission of specimen other than nasopharyngeal swab, presence of viral mutation(s) within the areas targeted by this assay, and inadequate number of viral copies(<138 copies/mL). A negative result must be combined with clinical observations, patient history, and epidemiological information. The expected result is Negative.  Fact Sheet for Patients:  EntrepreneurPulse.com.au  Fact Sheet for Healthcare Providers:  IncredibleEmployment.be  This test is no t yet approved or cleared by the [place] FDA and  has been authorized for detection and/or diagnosis of SARS-CoV-2 by FDA under an Emergency Use Authorization (EUA). This EUA will remain  in effect (meaning this test can be used) for the duration of the COVID-19 declaration under Section 564(b)(1) of the Act, 21 U.S.C.section 360bbb-3(b)(1), unless the authorization is terminated  or revoked sooner.       Influenza A by PCR NEGATIVE NEGATIVE Final   Influenza B by PCR NEGATIVE NEGATIVE Final    Comment: (  NOTE) The Xpert Xpress SARS-CoV-2/FLU/RSV plus assay is intended as an aid in the diagnosis of influenza from Nasopharyngeal swab specimens and should not be used as a sole basis for treatment. Nasal washings  and aspirates are unacceptable for Xpert Xpress SARS-CoV-2/FLU/RSV testing.  Fact Sheet for Patients: EntrepreneurPulse.com.au  Fact Sheet for Healthcare Providers: IncredibleEmployment.be  This test is not yet approved or cleared by the [place] FDA and has been authorized for detection and/or diagnosis of SARS-CoV-2 by FDA under an Emergency Use Authorization (EUA). This EUA will remain in effect (meaning this test can be used) for the duration of the COVID-19 declaration under Section 564(b)(1) of the Act, 21 U.S.C. section 360bbb-3(b)(1), unless the authorization is terminated or revoked.  Performed at [facility], [address] [place], [place] [address]      Time coordinating discharge: 25 minutes The [place] controlled substances registry was reviewed for this patient      30 Day Unplanned Readmission Risk Score    Flowsheet Row ED to Hosp-Admission (Discharged) from 02/14/2021 in [place] ICU/CCU  30 Day Unplanned Readmission Risk Score (%) 17.85 Filed at 02/17/2021 1600       This score is the patient's risk of an unplanned readmission within 30 days of being discharged (0 -100%). The score is based on dignosis, age, lab data, medications, orders, and past utilization.   Low:  0-14.9   Medium: 15-21.9   High: 22-29.9   Extreme: 30 and above            SIGNED:   Edwin Dada, MD  [facility] 03/16/2021, 9:36 AM

## 2021-03-16 NOTE — TOC Transition Note (Addendum)
Transition of Care [facility]) - CM/SW Discharge Note   Patient Details  Name: Tanya Roy MRN: [id number] Date of Birth: Dec 20, 1928  [facility] CM/SW Contact:  Alberteen Sam, LCSW Phone Number: 03/16/2021, 9:33 AM   Clinical Narrative:     Patient to discharge today. CSW spoke with patient's sister [name] who reports patient currently lives at [facility] in [place] and is in [place] however is waiting on an ALF bed there. CSW did call [facility] at [phone] and spoke with director Butch Penny who reports plan is for patient to return today to ILF as she continues to await ALF bed. Informed her of home health services rec, reports that the facility preference is [facility].   CSW has reached out to Clarise Cruz with [facility] to set patient up with max HH services of PT, OT, RN, aide and Education officer, museum. Patient will also get a 3in1 delivered to room by [employer].   [facility] reports if needed family can reach out to them to set up transport, however patient's sister [name] reports she is able to transport patient today.   CSW confirmed with Director Butch Penny on her cell at [phone] that they have a plan at [place] to provide patient with daily meds morning and evening.   No other discharge needs identified at this time.    Final next level of care:  (ILF- home place) Barriers to Discharge: No Barriers Identified   Patient Goals and CMS Choice Patient states their goals for this hospitalization and ongoing recovery are:: to go home CMS Medicare.gov Compare Post Acute Care list provided to:: Patient Represenative (must comment) (sister [name]) Choice offered to / list presented to : Sibling  Discharge Placement                Patient to be transferred to facility by: sister [name] Name of family member notified: sister [name] Patient and family notified of of transfer: 03/16/21  Discharge Plan and Services                DME Arranged: 3-N-1 DME Agency: [facility] Date DME Agency  Contacted: 03/16/21 Time DME Agency Contacted: [phone] Representative spoke with at DME Agency: Suanne Marker HH Arranged: PT, OT, RN, Nurse's Aide, Social Work [facility] Agency: [facility] Date [place] Agency Contacted: 03/16/21 Time [place]: [phone] Representative spoke with at [place]: [place] ([place]) Interventions     Readmission Risk Interventions No flowsheet data found.

## 2021-03-18 ENCOUNTER — Telehealth: Payer: Self-pay | Admitting: Cardiovascular Disease

## 2021-03-18 MED ORDER — ENTRESTO 24-26 MG PO TABS: 1.0000 | ORAL_TABLET | Freq: Two times a day (BID) | ORAL | 0 refills | Status: DC

## 2021-03-18 MED ORDER — ATORVASTATIN CALCIUM 80 MG PO TABS: 80.0000 mg | ORAL_TABLET | Freq: Every day | ORAL | 0 refills | Status: DC

## 2021-03-18 NOTE — Telephone Encounter (Signed)
*  STAT* If patient is at the pharmacy, call can be transferred to refill team.   1. Which medications need to be refilled? (please list name of each medication and dose if known)  Entresto 24-26 MG 1 tablet daily  Atorvastatin 80 MG 1 tablet at bedtime  2. Which pharmacy/location (including street and city if local pharmacy) is medication to be sent to? [facility] [place]  3. Do they need a 30 day or 90 day supply? 90 day  [facility] calling - states patient was discharged from hospital with instructions for this medication but it was never sent in.  Please review and send.

## 2021-03-22 ENCOUNTER — Telehealth: Payer: Self-pay | Admitting: Cardiovascular Disease

## 2021-03-22 MED ORDER — CARVEDILOL 3.125 MG PO TABS: 3.1250 mg | ORAL_TABLET | Freq: Two times a day (BID) | ORAL | 0 refills | Status: DC

## 2021-03-22 MED ORDER — TICAGRELOR 90 MG PO TABS: 90.0000 mg | ORAL_TABLET | Freq: Two times a day (BID) | ORAL | 0 refills | Status: DC

## 2021-03-22 NOTE — Telephone Encounter (Signed)
Requested Prescriptions   Signed Prescriptions Disp Refills   carvedilol (COREG) 3.125 MG tablet 180 tablet 0    Sig: Take 1 tablet (3.125 mg total) by mouth 2 (two) times daily with a meal.    Authorizing Provider: Kathlyn Sacramento A    Ordering User: Othelia Pulling C   ticagrelor (BRILINTA) 90 MG TABS tablet 180 tablet 0    Sig: Take 1 tablet (90 mg total) by mouth 2 (two) times daily.    Authorizing Provider: Kathlyn Sacramento A    Ordering User: Britt Bottom

## 2021-03-22 NOTE — Telephone Encounter (Signed)
*  STAT* If patient is at the pharmacy, call can be transferred to refill team.   1. Which medications need to be refilled? (please list name of each medication and dose if known)  Brilinta 90 MG 1 tablet 2 times daily Carvedilol 3.125 MG 1 tablet 2 times daily   2. Which pharmacy/location (including street and city if local pharmacy) is medication to be sent to? [place]  3. Do they need a 30 day or 90 day supply? 90 day

## 2021-03-25 ENCOUNTER — Ambulatory Visit: Payer: Medicare Other | Admitting: Medical

## 2021-03-28 NOTE — Progress Notes (Signed)
Cardiology Office Note:    Date:  03/29/2021   ID:  Tanya Roy, DOB 1929/03/28, MRN [id number]  PCP:  Tanya Late, MD  [facility] Cardiologist:  Dr[facility] Electrophysiologist:  None   Referring MD: Tanya Late, MD   Chief Complaint:Follow-up staged PCI  History of Present Illness:    Tanya Roy is a 87 y.o. female with a hx of hx of HT, HFrEF, ICM, HTN, HLD and recent STEMI s/p PCILAD/RCA and staged PCI LAD/RCA 9/2022who presents for hostpial follow-up.    Recently hospitalized 02/14/21 for anterior STEMI. She underwent emergent cath showing 3V disease with 100%mLAD stenosis, 90% stenosis Lcx, and 85% RCA, moderately elevated LVEDP at 20mm Hg. Patient was treated with DESx2 to LAD, plan for outpatient stage PCI to left Cx and RCA. She was started on Aspirin, Brilinta, coreg and lipitor. Echo showed EF 30-35%.   Patient was seen in follow-up an was set up for staged PCI for PCI to Lcx and RCA.   Day of her procedure 03/14/21 and she underwent successful angioplasty and DES to both left Cx and RCA. Post procedure she was noted to be confused and admitted. Discharged on DAPT.   Today, the patient reports she has been doing well. Has been ambulating without symptoms. Cath site right wrist healed well. No further confusion. She is at assisted living, she does therapy there. Cannot probably do cardiac rehab. She reports compliance with DAPT.   Past Medical History:  Diagnosis Date   Abnormal mammogram of right breast 06/26/2010   Microcalcifications   Arthritis    Confusion 02/14/2021   start before STEMI acc. to sister Tanya Roy   Osteoporosis     Past Surgical History:  Procedure Laterality Date   APPENDECTOMY     BREAST BIOPSY Right 06/27/2011   stereo, neg   CARDIAC CATHETERIZATION     CORONARY STENT INTERVENTION N/A 03/14/2021   Procedure: CORONARY STENT INTERVENTION;  Surgeon: Wellington Hampshire, MD;  Location: [place] [facility];  Service:  Cardiovascular;  Laterality: N/A;   CORONARY/GRAFT ACUTE MI REVASCULARIZATION N/A 02/14/2021   Procedure: Coronary/Graft Acute MI Revascularization;  Surgeon: Wellington Hampshire, MD;  Location: [place] [facility];  Service: Cardiovascular;  Laterality: N/A;   FEMUR IM NAIL Right 08/29/2016   Procedure: INTRAMEDULLARY (IM) NAIL FEMORAL;  Surgeon: Hessie Knows, MD;  Location: [facility];  Service: Orthopedics;  Laterality: Right;   LEFT HEART CATH AND CORONARY ANGIOGRAPHY N/A 02/14/2021   Procedure: LEFT HEART CATH AND CORONARY ANGIOGRAPHY;  Surgeon: Wellington Hampshire, MD;  Location: [place] [facility];  Service: Cardiovascular;  Laterality: N/A;   MELANOMA EXCISION Right 03/26/2015   Procedure: MELANOMA EXCISION;  Surgeon: Clayburn Pert, MD;  Location: [facility];  Service: General;  Laterality: Right;   ORIF WRIST FRACTURE Right 08/29/2016   Procedure: OPEN REDUCTION INTERNAL FIXATION (ORIF) WRIST FRACTURE;  Surgeon: Hessie Knows, MD;  Location: [facility];  Service: Orthopedics;  Laterality: Right;   SKIN BIOPSY  03/11/2015   Back    Current Medications: Current Meds  Medication Sig   aspirin EC 81 MG tablet Take 81 mg by mouth daily.   atorvastatin (LIPITOR) 80 MG tablet Take 1 tablet (80 mg total) by mouth at bedtime.   carvedilol (COREG) 3.125 MG tablet Take 1 tablet (3.125 mg total) by mouth 2 (two) times daily with a meal.   Multiple Vitamin (MULTIVITAMIN) tablet Take 1 tablet by mouth daily.   nitroGLYCERIN (NITROSTAT) 0.4 MG SL  tablet Place 1 tablet (0.4 mg total) under the tongue every 5 (five) minutes as needed for chest pain.   sacubitril-valsartan (ENTRESTO) 24-26 MG Take 1 tablet by mouth 2 (two) times daily.   ticagrelor (BRILINTA) 90 MG TABS tablet Take 1 tablet (90 mg total) by mouth 2 (two) times daily.   Vitamin D, Ergocalciferol, (DRISDOL) 50000 units CAPS capsule Take 50,000 Units by mouth every 7 (seven) days.     Allergies:   Patient has no known allergies.   Social  History   Socioeconomic History   Marital status: Widowed    Spouse name: Not on file   Number of children: Not on file   Years of education: Not on file   Highest education level: Not on file  Occupational History   Not on file  Tobacco Use   Smoking status: Never   Smokeless tobacco: Never  Vaping Use   Vaping Use: Never used  Substance and Sexual Activity   Alcohol use: No    Alcohol/week: 0.0 standard drinks   Drug use: No   Sexual activity: Not on file  Other Topics Concern   Not on file  Social History Narrative   Lives at [place] in independent living section.    Social Determinants of Health   Financial Resource Strain: Not on file  Food Insecurity: Not on file  Transportation Needs: Not on file  Physical Activity: Not on file  Stress: Not on file  Social Connections: Not on file     Family History: The patient's family history includes Heart disease in her father; Tuberculosis in her mother. There is no history of Breast cancer.  ROS:   Please see the history of present illness.     All other systems reviewed and are negative.  EKGs/Labs/Other Studies Reviewed:    The following studies were reviewed today:  Cardiac cath 03/14/21   Prox Cx lesion is 90% stenosed.   Prox RCA lesion is 85% stenosed.   Non-stenotic Mid LAD lesion was previously treated.   A drug-eluting stent was successfully placed using a STENT ONYX FRONTIER 3.5X15.   A drug-eluting stent was successfully placed using a STENT ONYX FRONTIER 3.0X38.   Post intervention, there is a 0% residual stenosis.   Post intervention, there is a 0% residual stenosis.   Successful angioplasty and drug-eluting stent placement to both left circumflex and right coronary artery.   Recommendations: Continue dual antiplatelet therapy as planned for 1 year. The patient has not been able to take care of herself at independent living.  I consulted case management for assisted living placement.    Echo  02/15/21  1. Left ventricular ejection fraction, by estimation, is 30 to 35%. The  left ventricle has moderately decreased function. The left ventricle  demonstrates regional wall motion abnormalities (see scoring  diagram/findings for description). There is mild  left ventricular hypertrophy. Left ventricular diastolic parameters are  consistent with Grade I diastolic dysfunction (impaired relaxation).  Elevated left atrial pressure.   2. Right ventricular systolic function is normal. The right ventricular  size is normal.   3. A small pericardial effusion is present.   4. The mitral valve is degenerative. Trivial mitral valve regurgitation.  No evidence of mitral stenosis.   5. The aortic valve was not well visualized. Aortic valve regurgitation  is trivial. No aortic stenosis is present.   Cardiac cath 02/14/21   Mid LAD lesion is 100% stenosed.   Prox Cx lesion is 90% stenosed.  Prox RCA lesion is 85% stenosed.   A drug-eluting stent was successfully placed using a STENT ONYX FRONTIER 3.0X26.   Post intervention, there is a 0% residual stenosis.   1.  Significant three-vessel coronary artery disease.  The culprit for anterior STEMI is an occluded mid LAD. 2.  Left ventricular angiography was not performed. 3.  Moderately to severely elevated left ventricular end-diastolic pressure at 30 mmHg. 4.  Successful angioplasty and drug-eluting stent placement to the mid LAD.  A second overlapped stent was used distally due to suspected distal edge dissection   Recommendations: Dual antiplatelet therapy for at least 12 months. Aggressive treatment of risk factors. Obtain an echocardiogram to evaluate ejection fraction.  I started small dose carvedilol with plans to start an ACE inhibitor or ARB. Recommend staged PCI of the left circumflex and right coronary artery in few weeks.    EKG:  EKG is  ordered today.  The ekg ordered today demonstrates NSR, 80bpm, septal infarct, TWI lateral  leads, RAD  Recent Labs: 02/14/2021: ALT 21 03/16/2021: BUN 14; Creatinine, Ser 0.65; Hemoglobin 14.2; Magnesium 1.9; Platelets 157; Potassium 3.2; Sodium 137  Recent Lipid Panel No results found for: CHOL, TRIG, HDL, CHOLHDL, VLDL, LDLCALC, LDLDIRECT   Physical Exam:    VS:  BP 136/74 (BP Location: Left Arm, Patient Position: Sitting, Cuff Size: Normal)   Pulse 80   Ht 5\' 6"  (1.676 m)   Wt 134 lb (60.8 kg)   SpO2 98%   BMI 21.63 kg/m     Wt Readings from Last 3 Encounters:  03/29/21 134 lb (60.8 kg)  03/14/21 135 lb 0.9 oz (61.3 kg)  03/04/21 135 lb (61.2 kg)     GEN:  Well nourished, well developed in no acute distress HEENT: Normal NECK: No JVD; No carotid bruits LYMPHATICS: No lymphadenopathy CARDIAC: RRR, no murmurs, rubs, gallops RESPIRATORY:  Clear to auscultation without rales, wheezing or rhonchi  ABDOMEN: Soft, non-tender, non-distended MUSCULOSKELETAL:  No edema; No deformity  SKIN: Warm and dry NEUROLOGIC:  Alert and oriented x 3 PSYCHIATRIC:  Normal affect   ASSESSMENT:    1. Coronary artery disease involving coronary bypass graft of native heart without angina pectoris   2. Chronic systolic heart failure ([place])   3. Ischemic cardiomyopathy   4. Hyperlipidemia, mixed   5. Essential hypertension    PLAN:    In order of problems listed above:  Anterior STEMI CAD s/p DES x2 mLAD with recent staged PCI tp pCX and pRCA H/o recent anterior STEMI, cath showed 3V disease with 100%mLAD stenosis, 90% stenosis Lcx, and 85% RCA, moderately elevated LVEDP at 27mm Hg. Patient was treated with DESx2 to LAD, and was subsequently scheduled for OP staged PCI to left Cx and RCA. She was admitted post-procedure for AMS, which has since resolved. Patient reports she has been dong well since discharge. Ambulating without anginal symptoms. Right radial cath site has healed well. No bleeding issues on DAPT. She is in an assisted living and does therapy there, unsure she would be  able to get to cardiac rehab. Continue Aspirin, Brilinta, coreg, statin.   Chronic HFrEF secondary to ICM Patient appears euvolemic on exam. She is on Coreg 3.125mg  BID and Entresto 24-26mg  BID. I will add Jardiance. BMET in a week. We will see her back in 3-4 week for further GDMT management. Will eventually need repeat echo to reassess pump function  Hypokalemia Labs 03/16/21 showed K 3.2. I will check BMET today  HTN BP reasonable.  Continue coreg and Entresto.   HLD LDL 35 03/2021. Continue Lipitor.  Disposition: Follow up  in 3-4 weeks  with MD/APP    Signed, Neamiah Sciarra Ninfa Meeker, PA-C  03/29/2021 10:23 AM    [facility]

## 2021-03-29 ENCOUNTER — Ambulatory Visit (INDEPENDENT_AMBULATORY_CARE_PROVIDER_SITE_OTHER): Payer: Medicare Other | Admitting: Medical

## 2021-03-29 ENCOUNTER — Other Ambulatory Visit: Payer: Self-pay

## 2021-03-29 ENCOUNTER — Encounter: Payer: Self-pay | Admitting: Medical

## 2021-03-29 VITALS — BP 136/74 | HR 80 | Ht 66.0 in | Wt 134.0 lb

## 2021-03-29 DIAGNOSIS — I10 Essential (primary) hypertension: Secondary | ICD-10-CM | POA: Diagnosis not present

## 2021-03-29 DIAGNOSIS — I5022 Chronic systolic (congestive) heart failure: Secondary | ICD-10-CM | POA: Diagnosis not present

## 2021-03-29 DIAGNOSIS — Z79899 Other long term (current) drug therapy: Secondary | ICD-10-CM

## 2021-03-29 DIAGNOSIS — I255 Ischemic cardiomyopathy: Secondary | ICD-10-CM | POA: Diagnosis not present

## 2021-03-29 DIAGNOSIS — E782 Mixed hyperlipidemia: Secondary | ICD-10-CM | POA: Diagnosis not present

## 2021-03-29 DIAGNOSIS — I2102 ST elevation (STEMI) myocardial infarction involving left anterior descending coronary artery: Secondary | ICD-10-CM

## 2021-03-29 DIAGNOSIS — I25810 Atherosclerosis of coronary artery bypass graft(s) without angina pectoris: Secondary | ICD-10-CM | POA: Diagnosis not present

## 2021-03-29 MED ORDER — EMPAGLIFLOZIN 10 MG PO TABS: 10.0000 mg | ORAL_TABLET | Freq: Every day | ORAL | 3 refills | Status: DC

## 2021-03-29 NOTE — Patient Instructions (Signed)
Medication Instructions:   Your physician has recommended you make the following change in your medication:   START Jardiance 10 mg daily   *If you need a refill on your cardiac medications before your next appointment, please call your pharmacy*   Lab Work:  1) BMET today  2) Your physician recommends that you return for lab work in: [place] (BMET)  If you have labs (blood work) drawn today and your tests are completely normal, you will receive your results only by: [place] (if you have MyChart) OR A paper copy in the mail If you have any lab test that is abnormal or we need to change your treatment, we will call you to review the results.   Testing/Procedures:  None ordered   Follow-Up: At [facility], you and your health needs are our priority.  As part of our continuing mission to provide you with exceptional heart care, we have created designated Provider Care Teams.  These Care Teams include your primary Cardiologist (physician) and Advanced Practice Providers (APPs -  Physician Assistants and Nurse Practitioners) who all work together to provide you with the care you need, when you need it.  We recommend signing up for the patient portal called "MyChart".  Sign up information is provided on this After Visit Summary.  MyChart is used to connect with patients for Virtual Visits (Telemedicine).  Patients are able to view lab/test results, encounter notes, upcoming appointments, etc.  Non-urgent messages can be sent to your provider as well.   To learn more about what you can do with MyChart, go to [email or web address].    Your next appointment:   3 - 4 week(s)  The format for your next appointment:   In Person  Provider:   You may see Dr. Fletcher Anon or one of the following Advanced Practice Providers on your designated Care Team:    Cadence Kathlen Mody, [place]   Other Instructions  You have been referred to cardiac rehab. You will be called to schedule by cardiac  rehab department.

## 2021-03-30 LAB — BASIC METABOLIC PANEL
BUN/Creatinine Ratio: 22 (ref 12–28)
BUN: 21 mg/dL (ref 8–27)
CO2: 21 mmol/L (ref 20–29)
Calcium: 9.4 mg/dL (ref 8.7–10.3)
Chloride: 106 mmol/L (ref 96–106)
Creatinine, Ser: 0.97 mg/dL (ref 0.57–1.00)
Glucose: 106 mg/dL — ABNORMAL HIGH (ref 70–99)
Potassium: 4.5 mmol/L (ref 3.5–5.2)
Sodium: 142 mmol/L (ref 134–144)
eGFR: 56 mL/min/{1.73_m2} — ABNORMAL LOW (ref >59)

## 2021-04-04 ENCOUNTER — Other Ambulatory Visit (INDEPENDENT_AMBULATORY_CARE_PROVIDER_SITE_OTHER): Payer: Medicare Other

## 2021-04-04 ENCOUNTER — Other Ambulatory Visit: Payer: Self-pay

## 2021-04-04 DIAGNOSIS — I5022 Chronic systolic (congestive) heart failure: Secondary | ICD-10-CM

## 2021-04-04 DIAGNOSIS — I25810 Atherosclerosis of coronary artery bypass graft(s) without angina pectoris: Secondary | ICD-10-CM | POA: Diagnosis not present

## 2021-04-04 DIAGNOSIS — Z79899 Other long term (current) drug therapy: Secondary | ICD-10-CM

## 2021-04-05 LAB — BASIC METABOLIC PANEL
BUN/Creatinine Ratio: 20 (ref 12–28)
BUN: 17 mg/dL (ref 8–27)
CO2: 20 mmol/L (ref 20–29)
Calcium: 9.5 mg/dL (ref 8.7–10.3)
Chloride: 105 mmol/L (ref 96–106)
Creatinine, Ser: 0.86 mg/dL (ref 0.57–1.00)
Glucose: 106 mg/dL — ABNORMAL HIGH (ref 70–99)
Potassium: 4.3 mmol/L (ref 3.5–5.2)
Sodium: 142 mmol/L (ref 134–144)
eGFR: 65 mL/min/{1.73_m2} (ref >59)

## 2021-04-07 ENCOUNTER — Telehealth: Payer: Self-pay

## 2021-04-07 ENCOUNTER — Other Ambulatory Visit: Payer: Self-pay

## 2021-04-07 MED ORDER — ATORVASTATIN CALCIUM 80 MG PO TABS: 80.0000 mg | ORAL_TABLET | Freq: Every day | ORAL | 0 refills | Status: DC

## 2021-04-07 NOTE — Telephone Encounter (Signed)
This is a [place] pt 

## 2021-04-07 NOTE — Telephone Encounter (Signed)
Able to reach pt regarding her recent lab work, [employer], PA-C had a chance to review their results and advised   "Labs look good. Continue current medications. "  Mrs. Bonsignore thankful for calling. All questions or concerns were address and no additional concerns at this time. Will call back for anything further.

## 2021-04-21 NOTE — Progress Notes (Signed)
Cardiology Office Note:    Date:  04/22/2021   ID:  Tanya Roy, DOB Oct 01, 1928, MRN [id number]  PCP:  Derinda Late, MD  [facility] Cardiologist:  Dr. [facility] Electrophysiologist:  None   Referring MD: Derinda Late, MD   Chief Complaint: 3-4 week f/u  History of Present Illness:    Tanya Roy is a 87 y.o. female with a hx of  with a hx of hx of HT, HFrEF, ICM, HTN, HLD and recent STEMI s/p PCILAD/RCA and staged PCI LAD/RCA 02/2021 who presents for 3-4 week follow-up.    Hospitalized 02/14/21 for anterior STEMI. She underwent emergent cath showing 3V disease with 100%mLAD stenosis, 90% stenosis Lcx, and 85% RCA, moderately elevated LVEDP at 4mm Hg. Patient was treated with DESx2 to LAD, plan for outpatient stage PCI to left Cx and RCA. She was started on Aspirin, Brilinta, coreg and lipitor. Echo showed EF 30-35%.    Patient was seen in follow-up an was set up for staged PCI for PCI to Lcx and RCA. Day of her procedure 03/14/21 and she underwent successful angioplasty and DES to both left Cx and RCA. Post procedure she was noted to be confused and admitted. Discharged on DAPT.    Last seen 03/29/21 and was doing well from a cardiac perspective. Unable to get to cardiac rehab. Jardiance was added.   Today, BP high but she hasn't had her medications today. Daughter reports patient has low appetite. Only eats cereal and bacon. Having some diarrhea, unsure if this is from the medications. May need to take medications after she eats. She is tolerating Jardiance. Denies chest pain, sob, lower leg swelling, orthopnea, pnd.   Past Medical History:  Diagnosis Date   Abnormal mammogram of right breast 06/26/2010   Microcalcifications   Arthritis    Confusion 02/14/2021   start before STEMI acc. to sister [name]   Osteoporosis     Past Surgical History:  Procedure Laterality Date   APPENDECTOMY     BREAST BIOPSY Right 06/27/2011   stereo, neg   CARDIAC  CATHETERIZATION     CORONARY STENT INTERVENTION N/A 03/14/2021   Procedure: CORONARY STENT INTERVENTION;  Surgeon: Wellington Hampshire, MD;  Location: [place] [facility];  Service: Cardiovascular;  Laterality: N/A;   CORONARY/GRAFT ACUTE MI REVASCULARIZATION N/A 02/14/2021   Procedure: Coronary/Graft Acute MI Revascularization;  Surgeon: Wellington Hampshire, MD;  Location: [place] [facility];  Service: Cardiovascular;  Laterality: N/A;   FEMUR IM NAIL Right 08/29/2016   Procedure: INTRAMEDULLARY (IM) NAIL FEMORAL;  Surgeon: Hessie Knows, MD;  Location: [facility];  Service: Orthopedics;  Laterality: Right;   LEFT HEART CATH AND CORONARY ANGIOGRAPHY N/A 02/14/2021   Procedure: LEFT HEART CATH AND CORONARY ANGIOGRAPHY;  Surgeon: Wellington Hampshire, MD;  Location: [place] [facility];  Service: Cardiovascular;  Laterality: N/A;   MELANOMA EXCISION Right 03/26/2015   Procedure: MELANOMA EXCISION;  Surgeon: Clayburn Pert, MD;  Location: [facility];  Service: General;  Laterality: Right;   ORIF WRIST FRACTURE Right 08/29/2016   Procedure: OPEN REDUCTION INTERNAL FIXATION (ORIF) WRIST FRACTURE;  Surgeon: Hessie Knows, MD;  Location: [facility];  Service: Orthopedics;  Laterality: Right;   SKIN BIOPSY  03/11/2015   Back    Current Medications: Current Meds  Medication Sig   aspirin EC 81 MG tablet Take 81 mg by mouth daily.   atorvastatin (LIPITOR) 80 MG tablet Take 1 tablet (80 mg total) by mouth at bedtime.  carvedilol (COREG) 3.125 MG tablet Take 1 tablet (3.125 mg total) by mouth 2 (two) times daily with a meal.   empagliflozin (JARDIANCE) 10 MG TABS tablet Take 1 tablet (10 mg total) by mouth daily before breakfast.   Multiple Vitamin (MULTIVITAMIN) tablet Take 1 tablet by mouth daily.   nitroGLYCERIN (NITROSTAT) 0.4 MG SL tablet Place 1 tablet (0.4 mg total) under the tongue every 5 (five) minutes as needed for chest pain.   sacubitril-valsartan (ENTRESTO) 24-26 MG Take 1 tablet by mouth 2 (two)  times daily.   ticagrelor (BRILINTA) 90 MG TABS tablet Take 1 tablet (90 mg total) by mouth 2 (two) times daily.   Vitamin D, Ergocalciferol, (DRISDOL) 50000 units CAPS capsule Take 50,000 Units by mouth every 7 (seven) days.     Allergies:   Patient has no known allergies.   Social History   Socioeconomic History   Marital status: Widowed    Spouse name: Not on file   Number of children: Not on file   Years of education: Not on file   Highest education level: Not on file  Occupational History   Not on file  Tobacco Use   Smoking status: Never   Smokeless tobacco: Never  Vaping Use   Vaping Use: Never used  Substance and Sexual Activity   Alcohol use: No    Alcohol/week: 0.0 standard drinks   Drug use: No   Sexual activity: Not on file  Other Topics Concern   Not on file  Social History Narrative   Lives at [place] in independent living section.    Social Determinants of Health   Financial Resource Strain: Not on file  Food Insecurity: Not on file  Transportation Needs: Not on file  Physical Activity: Not on file  Stress: Not on file  Social Connections: Not on file     Family History: The patient's family history includes Heart disease in her father; Tuberculosis in her mother. There is no history of Breast cancer.  ROS:   Please see the history of present illness.     All other systems reviewed and are negative.  EKGs/Labs/Other Studies Reviewed:    The following studies were reviewed today:    Cardiac cath 03/14/21   Prox Cx lesion is 90% stenosed.   Prox RCA lesion is 85% stenosed.   Non-stenotic Mid LAD lesion was previously treated.   A drug-eluting stent was successfully placed using a STENT ONYX FRONTIER 3.5X15.   A drug-eluting stent was successfully placed using a STENT ONYX FRONTIER 3.0X38.   Post intervention, there is a 0% residual stenosis.   Post intervention, there is a 0% residual stenosis.   Successful angioplasty and drug-eluting  stent placement to both left circumflex and right coronary artery.   Recommendations: Continue dual antiplatelet therapy as planned for 1 year. The patient has not been able to take care of herself at independent living.  I consulted case management for assisted living placement.     Echo 02/15/21  1. Left ventricular ejection fraction, by estimation, is 30 to 35%. The  left ventricle has moderately decreased function. The left ventricle  demonstrates regional wall motion abnormalities (see scoring  diagram/findings for description). There is mild  left ventricular hypertrophy. Left ventricular diastolic parameters are  consistent with Grade I diastolic dysfunction (impaired relaxation).  Elevated left atrial pressure.   2. Right ventricular systolic function is normal. The right ventricular  size is normal.   3. A small pericardial effusion is present.  4. The mitral valve is degenerative. Trivial mitral valve regurgitation.  No evidence of mitral stenosis.   5. The aortic valve was not well visualized. Aortic valve regurgitation  is trivial. No aortic stenosis is present.    Cardiac cath 02/14/21   Mid LAD lesion is 100% stenosed.   Prox Cx lesion is 90% stenosed.   Prox RCA lesion is 85% stenosed.   A drug-eluting stent was successfully placed using a STENT ONYX FRONTIER 3.0X26.   Post intervention, there is a 0% residual stenosis.   1.  Significant three-vessel coronary artery disease.  The culprit for anterior STEMI is an occluded mid LAD. 2.  Left ventricular angiography was not performed. 3.  Moderately to severely elevated left ventricular end-diastolic pressure at 30 mmHg. 4.  Successful angioplasty and drug-eluting stent placement to the mid LAD.  A second overlapped stent was used distally due to suspected distal edge dissection   Recommendations: Dual antiplatelet therapy for at least 12 months. Aggressive treatment of risk factors. Obtain an echocardiogram to  evaluate ejection fraction.  I started small dose carvedilol with plans to start an ACE inhibitor or ARB. Recommend staged PCI of the left circumflex and right coronary artery in few weeks.  EKG:  EKG is not ordered today.   Recent Labs: 02/14/2021: ALT 21 03/16/2021: Hemoglobin 14.2; Magnesium 1.9; Platelets 157 04/04/2021: BUN 17; Creatinine, Ser 0.86; Potassium 4.3; Sodium 142  Recent Lipid Panel No results found for: CHOL, TRIG, HDL, CHOLHDL, VLDL, LDLCALC, LDLDIRECT   Physical Exam:    VS:  BP (!) 158/90 (BP Location: Left Arm, Patient Position: Sitting, Cuff Size: Normal)   Pulse 91   Ht 5\' 6"  (1.676 m)   Wt 129 lb 4 oz (58.6 kg)   SpO2 98%   BMI 20.86 kg/m     Wt Readings from Last 3 Encounters:  04/22/21 129 lb 4 oz (58.6 kg)  03/29/21 134 lb (60.8 kg)  03/14/21 135 lb 0.9 oz (61.3 kg)     GEN:  Well nourished, well developed in no acute distress HEENT: Normal NECK: No JVD; No carotid bruits LYMPHATICS: No lymphadenopathy CARDIAC: RRR, no murmurs, rubs, gallops RESPIRATORY:  Clear to auscultation without rales, wheezing or rhonchi  ABDOMEN: Soft, non-tender, non-distended MUSCULOSKELETAL:  no edema; No deformity  SKIN: Warm and dry NEUROLOGIC:  Alert and oriented x 3 PSYCHIATRIC:  Normal affect   ASSESSMENT:    1. Chronic systolic heart failure ([place])   2. Coronary artery disease involving coronary bypass graft of native heart without angina pectoris   3. Hyperlipidemia, mixed   4. Essential hypertension    PLAN:    In order of problems listed above:  CAD s/p Anterior STEMI s/p DES x2 mLAD with recent staged PCI to pCx and pRCA Patient denies any anginal symptoms. She is ambulating without issues. Unable to get to cardiac rehab. Denies bleeding issues on DAPT. Continue Aspirin, Briinta, Coreg, and statin. No further work-up at this time.   HFrEF ICM Initial echo showed LVEF 30-35%. She is on Coreg 3.125mg  BID, Entresto 24-26mg BID, Jardiance 10mg  daily.  Euvolemic on exam today. I will add spironolactone 12.5mg  daily, BMET in a week. Follow-up echo in 1 month and we will see her back after this.   HTN BP high, but has not had medications. Unsure what BP runs at home. Recommended bp be monitored as we are spironolactone.   HLD LDL 35 03/2021. Continue Lipitor.   Disposition: Follow up in 6-8 week(s) with MD/APP  Signed, Annarose Ouellet Ninfa Meeker, PA-C  04/22/2021 8:54 AM    [facility]

## 2021-04-22 ENCOUNTER — Telehealth: Payer: Self-pay | Admitting: Cardiovascular Disease

## 2021-04-22 ENCOUNTER — Other Ambulatory Visit: Payer: Self-pay

## 2021-04-22 ENCOUNTER — Ambulatory Visit (INDEPENDENT_AMBULATORY_CARE_PROVIDER_SITE_OTHER): Payer: Medicare Other | Admitting: Medical

## 2021-04-22 ENCOUNTER — Encounter: Payer: Self-pay | Admitting: Medical

## 2021-04-22 VITALS — BP 158/90 | HR 91 | Ht 66.0 in | Wt 129.2 lb

## 2021-04-22 DIAGNOSIS — I25810 Atherosclerosis of coronary artery bypass graft(s) without angina pectoris: Secondary | ICD-10-CM

## 2021-04-22 DIAGNOSIS — I10 Essential (primary) hypertension: Secondary | ICD-10-CM | POA: Diagnosis not present

## 2021-04-22 DIAGNOSIS — Z79899 Other long term (current) drug therapy: Secondary | ICD-10-CM

## 2021-04-22 DIAGNOSIS — E782 Mixed hyperlipidemia: Secondary | ICD-10-CM | POA: Diagnosis not present

## 2021-04-22 DIAGNOSIS — I5022 Chronic systolic (congestive) heart failure: Secondary | ICD-10-CM | POA: Diagnosis not present

## 2021-04-22 MED ORDER — SPIRONOLACTONE 25 MG PO TABS: 12.5000 mg | ORAL_TABLET | Freq: Every day | ORAL | 3 refills | Status: DC

## 2021-04-22 NOTE — Patient Instructions (Signed)
Medication Instructions:  1.Start spironolactone 12.5 mg daily. This comes in a 25 mg tablet so you will take one-half tablet by mouth daily. *If you need a refill on your cardiac medications before your next appointment, please call your pharmacy*   Lab Work: BMET in one week If you have labs (blood work) drawn today and your tests are completely normal, you will receive your results only by: [place] (if you have MyChart) OR A paper copy in the mail If you have any lab test that is abnormal or we need to change your treatment, we will call you to review the results.   Testing/Procedures: Your physician has requested that you have an echocardiogram. Echocardiography is a painless test that uses sound waves to create images of your heart. It provides your doctor with information about the size and shape of your heart and how well your heart's chambers and valves are working. This procedure takes approximately one hour. There are no restrictions for this procedure.    Follow-Up: At [facility], you and your health needs are our priority.  As part of our continuing mission to provide you with exceptional heart care, we have created designated Provider Care Teams.  These Care Teams include your primary Cardiologist (physician) and Advanced Practice Providers (APPs -  Physician Assistants and Nurse Practitioners) who all work together to provide you with the care you need, when you need it.  We recommend signing up for the patient portal called "MyChart".  Sign up information is provided on this After Visit Summary.  MyChart is used to connect with patients for Virtual Visits (Telemedicine).  Patients are able to view lab/test results, encounter notes, upcoming appointments, etc.  Non-urgent messages can be sent to your provider as well.   To learn more about what you can do with MyChart, go to [email or web address].    Your next appointment:   6-8 week(s)  The format for your next  appointment:   In Person  Provider:   Cadence Kathlen Mody, PA-C

## 2021-04-22 NOTE — Telephone Encounter (Signed)
Was able to reach back out to Pharmacist with [facility] on the assisted living side. Advised from [place], it appears [facility] placed a note stating  Progress Notes - documented in this encounter Babaoff, Tanya Liner, MD - 03/02/2021 9:45 AM EDT Formatting of this note is different from the original. Images from the original note were not included. Tanya Roy "Lib" is a 87 y.o. female who is here for a follow up visit  She was eating on 02/14/21 and started having chest pain. She went to the hospital and she was found to have a STEMI. She had cath done and had a drug-eluting stent placed in her LAD by Dr. Fletcher Anon. She has been doing well since getting out of the hospital. She has follow up with Cardiology in 2 days. She was started on brilinta, coreg, and losartan. Her aspirin and lisinopril were discontinued.  Pharmacist needed proof of d/c order so she may fill pt's new script of spironolactone. Read off office notes as stated above, appears med was d/c during a past admission. Pharmacy verbalized understanding.  Spironolactone will be filled for pt at this time.

## 2021-04-22 NOTE — Telephone Encounter (Signed)
[facility] calling  Received script for spironolactone States there is a potential interaction with lisinopril HTCZ Please review and call to discuss

## 2021-04-22 NOTE — Telephone Encounter (Signed)
Melissa from pharmacy at [facility] called back in to advise on interaction of spironolactone and Entresto increasing K+. Advised pt seen today, aware of both Entresto and adding on spironolactone, per notes  HFrEF ICM Initial echo showed LVEF 30-35%. She is on Coreg 3.125mg  BID, Entresto 24-26mg BID, Jardiance 10mg  daily. Euvolemic on exam today. I will add spironolactone 12.5mg  daily, BMET in a week. Follow-up echo in 1 month and we will see her back after this.   Will following K+ with BMP in one week. Melissa verblaized understanding and thankful for taking her call.

## 2021-04-25 ENCOUNTER — Other Ambulatory Visit: Payer: Self-pay | Admitting: Medical

## 2021-05-02 ENCOUNTER — Other Ambulatory Visit: Payer: Self-pay

## 2021-05-02 ENCOUNTER — Other Ambulatory Visit (INDEPENDENT_AMBULATORY_CARE_PROVIDER_SITE_OTHER): Payer: Medicare Other

## 2021-05-02 DIAGNOSIS — I5022 Chronic systolic (congestive) heart failure: Secondary | ICD-10-CM | POA: Diagnosis not present

## 2021-05-02 DIAGNOSIS — E782 Mixed hyperlipidemia: Secondary | ICD-10-CM

## 2021-05-02 DIAGNOSIS — Z79899 Other long term (current) drug therapy: Secondary | ICD-10-CM

## 2021-05-02 DIAGNOSIS — I25810 Atherosclerosis of coronary artery bypass graft(s) without angina pectoris: Secondary | ICD-10-CM

## 2021-05-03 LAB — BASIC METABOLIC PANEL
BUN/Creatinine Ratio: 21 (ref 12–28)
BUN: 19 mg/dL (ref 8–27)
CO2: 20 mmol/L (ref 20–29)
Calcium: 9.4 mg/dL (ref 8.7–10.3)
Chloride: 109 mmol/L — ABNORMAL HIGH (ref 96–106)
Creatinine, Ser: 0.91 mg/dL (ref 0.57–1.00)
Glucose: 108 mg/dL — ABNORMAL HIGH (ref 70–99)
Potassium: 4.2 mmol/L (ref 3.5–5.2)
Sodium: 142 mmol/L (ref 134–144)
eGFR: 60 mL/min/{1.73_m2} (ref >59)

## 2021-05-05 ENCOUNTER — Telehealth: Payer: Self-pay | Admitting: Medical

## 2021-05-05 NOTE — Telephone Encounter (Signed)
Cadence Ninfa Meeker, PA-C  05/05/2021  3:16 PM EST     BMET good, continue current meds

## 2021-05-05 NOTE — Telephone Encounter (Signed)
Attempted to call the patient. No answer- voice mail box is full.   Will attempt to call back at a later time.

## 2021-05-06 NOTE — Telephone Encounter (Signed)
Attempted to call the patient. No answer- voice mail box is full.   Will attempt to reach the patient back at a later time.

## 2021-05-10 NOTE — Telephone Encounter (Signed)
Lamar Laundry, RN  05/09/2021 11:32 AM EST Back to Top    Results letter mailed to the patient.

## 2021-05-17 ENCOUNTER — Ambulatory Visit (INDEPENDENT_AMBULATORY_CARE_PROVIDER_SITE_OTHER): Payer: Medicare Other

## 2021-05-17 ENCOUNTER — Other Ambulatory Visit: Payer: Self-pay

## 2021-05-17 ENCOUNTER — Other Ambulatory Visit: Payer: Medicare Other

## 2021-05-17 DIAGNOSIS — E782 Mixed hyperlipidemia: Secondary | ICD-10-CM

## 2021-05-17 DIAGNOSIS — I25810 Atherosclerosis of coronary artery bypass graft(s) without angina pectoris: Secondary | ICD-10-CM | POA: Diagnosis not present

## 2021-05-17 DIAGNOSIS — I5022 Chronic systolic (congestive) heart failure: Secondary | ICD-10-CM

## 2021-05-17 LAB — ECHOCARDIOGRAM COMPLETE
AR max vel: 3.3 cm2
AV Area VTI: 3.19 cm2
AV Area mean vel: 3 cm2
AV Mean grad: 3 mmHg
AV Peak grad: 4.1 mmHg
Ao pk vel: 1.01 m/s
Area-P 1/2: 3.74 cm2
Calc EF: 68.2 %
P 1/2 time: 496 msec
S' Lateral: 2.6 cm
Single Plane A2C EF: 65.4 %
Single Plane A4C EF: 69.2 %

## 2021-05-18 ENCOUNTER — Telehealth: Payer: Self-pay

## 2021-05-18 ENCOUNTER — Telehealth: Payer: Self-pay | Admitting: Medical

## 2021-05-18 NOTE — Telephone Encounter (Signed)
Called to give the patient echo results. Unable to lmom. Patients voicemail is full.

## 2021-05-18 NOTE — Telephone Encounter (Signed)
Tried calling patient. No answer and no voicemail set up for me to leave a message. 

## 2021-05-18 NOTE — Telephone Encounter (Signed)
-----   Message from [place], PA-C sent at 05/18/2021  4:25 PM EST ----- Echo showed improved pump function to 55%.

## 2021-05-23 NOTE — Telephone Encounter (Signed)
2nd attempt to call the patient. No answer- unable to leave a voice mail- mail box is full.

## 2021-05-24 ENCOUNTER — Other Ambulatory Visit: Payer: Self-pay | Admitting: *Deleted

## 2021-05-24 MED ORDER — EMPAGLIFLOZIN 10 MG PO TABS: 10.0000 mg | ORAL_TABLET | Freq: Every day | ORAL | 0 refills | Status: AC

## 2021-05-24 NOTE — Telephone Encounter (Signed)
Received fax request from [place] [facility] stating patient is requesting Rxfor 90 day supply of Jardiance. Rx faxed to [facility] for #90 and 0 refills.

## 2021-05-24 NOTE — Telephone Encounter (Signed)
No answer/Voicemail box is full.  

## 2021-05-25 NOTE — Telephone Encounter (Signed)
I spoke with the patient regarding her echo results. She voices understanding. She is advised to keep her in office appointment on 06/06/21 at 9:15 am with Cadence Kathlen Mody, [place]. The patient is agreeable.

## 2021-06-06 ENCOUNTER — Encounter: Payer: Self-pay | Admitting: Medical

## 2021-06-06 ENCOUNTER — Other Ambulatory Visit: Payer: Self-pay

## 2021-06-06 ENCOUNTER — Ambulatory Visit (INDEPENDENT_AMBULATORY_CARE_PROVIDER_SITE_OTHER): Payer: Medicare Other | Admitting: Medical

## 2021-06-06 VITALS — BP 120/78 | HR 88 | Ht 66.0 in | Wt 128.0 lb

## 2021-06-06 DIAGNOSIS — I5022 Chronic systolic (congestive) heart failure: Secondary | ICD-10-CM | POA: Diagnosis not present

## 2021-06-06 DIAGNOSIS — I10 Essential (primary) hypertension: Secondary | ICD-10-CM | POA: Diagnosis not present

## 2021-06-06 DIAGNOSIS — E782 Mixed hyperlipidemia: Secondary | ICD-10-CM

## 2021-06-06 DIAGNOSIS — I255 Ischemic cardiomyopathy: Secondary | ICD-10-CM | POA: Diagnosis not present

## 2021-06-06 DIAGNOSIS — I25810 Atherosclerosis of coronary artery bypass graft(s) without angina pectoris: Secondary | ICD-10-CM | POA: Diagnosis not present

## 2021-06-06 NOTE — Patient Instructions (Signed)
Medication Instructions:  Your physician recommends that you continue on your current medications as directed. Please refer to the Current Medication list given to you today.  *If you need a refill on your cardiac medications before your next appointment, please call your pharmacy*   Lab Work: None ordered If you have labs (blood work) drawn today and your tests are completely normal, you will receive your results only by: [place] (if you have MyChart) OR A paper copy in the mail If you have any lab test that is abnormal or we need to change your treatment, we will call you to review the results.   Testing/Procedures: None ordered   Follow-Up: At [facility], you and your health needs are our priority.  As part of our continuing mission to provide you with exceptional heart care, we have created designated Provider Care Teams.  These Care Teams include your primary Cardiologist (physician) and Advanced Practice Providers (APPs -  Physician Assistants and Nurse Practitioners) who all work together to provide you with the care you need, when you need it.  We recommend signing up for the patient portal called "MyChart".  Sign up information is provided on this After Visit Summary.  MyChart is used to connect with patients for Virtual Visits (Telemedicine).  Patients are able to view lab/test results, encounter notes, upcoming appointments, etc.  Non-urgent messages can be sent to your provider as well.   To learn more about what you can do with MyChart, go to [email or web address].    Your next appointment:   3 month(s)  The format for your next appointment:   In Person  Provider:   You may see Kathlyn Sacramento, MD or one of the following Advanced Practice Providers on your designated Care Team:   Murray Hodgkins, NP Christell Faith, PA-C Cadence Kathlen Mody, PA-C:1}    Other Instructions N/A

## 2021-06-06 NOTE — Progress Notes (Signed)
Cardiology Office Note:    Date:  06/06/2021   ID:  Tanya Roy, DOB [birth year], MRN [id number]  PCP:  Tanya Late, MD  [facility] Cardiologist:  None  [facility] Electrophysiologist:  None   Referring MD: Tanya Late, MD   Chief Complaint: 1 month follow-up  History of Present Illness:    Tanya Roy is a 87 y.o. female with a hx of HTN, HFrEF, ICM, HTN, HLD and recent STEMI s/p PCILAD/RCA and staged PCI LAD/RCA 02/2021 who presents for 3-4 week follow-up.    Hospitalized 02/14/21 for anterior STEMI. She underwent emergent cath showing 3V disease with 100%mLAD stenosis, 90% stenosis Lcx, and 85% RCA, moderately elevated LVEDP at 43mm Hg. Patient was treated with DESx2 to LAD, plan for outpatient stage PCI to left Cx and RCA. She was started on Aspirin, Brilinta, coreg and lipitor. Echo showed EF 30-35%.    Patient was seen in follow-up an was set up for staged PCI for PCI to Lcx and RCA. Day of her procedure 03/14/21 and she underwent successful angioplasty and DES to both left Cx and RCA. Post procedure she was noted to be confused and admitted. Discharged on DAPT.    Seen 03/29/21 and was doing well from a cardiac perspective. Unable to get to cardiac rehab. Jardiance was added.   Last seen 04/22/21 and was doing OK. Spironolactone was added. Echo was ordered.   Today, the patient reports she has been doing OK since the last visit. Echo was reviewed, it showed improved EF 55%. No chest pain, SOB, LLE, orthopnea, pnd. Has history of falls, may not tolerate increase in medications.    Past Medical History:  Diagnosis Date   Abnormal mammogram of right breast 06/26/2010   Microcalcifications   Arthritis    Confusion 02/14/2021   start before STEMI acc. to sister Tanya Roy   Osteoporosis     Past Surgical History:  Procedure Laterality Date   APPENDECTOMY     BREAST BIOPSY Right 06/27/2011   stereo, neg   CARDIAC CATHETERIZATION     CORONARY STENT  INTERVENTION N/A 03/14/2021   Procedure: CORONARY STENT INTERVENTION;  Surgeon: Tanya Hampshire, MD;  Location: [place] [facility];  Service: Cardiovascular;  Laterality: N/A;   CORONARY/GRAFT ACUTE MI REVASCULARIZATION N/A 02/14/2021   Procedure: Coronary/Graft Acute MI Revascularization;  Surgeon: Tanya Hampshire, MD;  Location: [place] [facility];  Service: Cardiovascular;  Laterality: N/A;   FEMUR IM NAIL Right 08/29/2016   Procedure: INTRAMEDULLARY (IM) NAIL FEMORAL;  Surgeon: Tanya Knows, MD;  Location: [facility];  Service: Orthopedics;  Laterality: Right;   LEFT HEART CATH AND CORONARY ANGIOGRAPHY N/A 02/14/2021   Procedure: LEFT HEART CATH AND CORONARY ANGIOGRAPHY;  Surgeon: Tanya Hampshire, MD;  Location: [place] [facility];  Service: Cardiovascular;  Laterality: N/A;   MELANOMA EXCISION Right 03/26/2015   Procedure: MELANOMA EXCISION;  Surgeon: Tanya Pert, MD;  Location: [facility];  Service: General;  Laterality: Right;   ORIF WRIST FRACTURE Right 08/29/2016   Procedure: OPEN REDUCTION INTERNAL FIXATION (ORIF) WRIST FRACTURE;  Surgeon: Tanya Knows, MD;  Location: [facility];  Service: Orthopedics;  Laterality: Right;   SKIN BIOPSY  03/11/2015   Back    Current Medications: Current Meds  Medication Sig   aspirin EC 81 MG tablet Take 81 mg by mouth daily.   atorvastatin (LIPITOR) 80 MG tablet TAKE 1 TABLET BY MOUTH AT BEDTIME   carvedilol (COREG) 3.125 MG tablet Take 1 tablet (3.125  mg total) by mouth 2 (two) times daily with a meal.   Cholecalciferol 125 MCG (5000 UT) TABS TAKE 1 TABLET BY MOUTH ONCE DAILY FOR SUPPLEMENT   empagliflozin (JARDIANCE) 10 MG TABS tablet Take 1 tablet (10 mg total) by mouth daily before breakfast.   Multiple Vitamin (MULTIVITAMIN) tablet Take 1 tablet by mouth daily.   nitroGLYCERIN (NITROSTAT) 0.4 MG SL tablet Place 1 tablet (0.4 mg total) under the tongue every 5 (five) minutes as needed for chest pain.   sacubitril-valsartan (ENTRESTO)  24-26 MG TAKE 1 TABLE BY MOUTH 2 TIMES DAILY   spironolactone (ALDACTONE) 25 MG tablet Take 0.5 tablets (12.5 mg total) by mouth daily.   ticagrelor (BRILINTA) 90 MG TABS tablet Take 1 tablet (90 mg total) by mouth 2 (two) times daily.   Vitamin D, Ergocalciferol, (DRISDOL) 50000 units CAPS capsule Take 50,000 Units by mouth every 7 (seven) days.     Allergies:   Patient has no known allergies.   Social History   Socioeconomic History   Marital status: Widowed    Spouse name: Not on file   Number of children: Not on file   Years of education: Not on file   Highest education level: Not on file  Occupational History   Not on file  Tobacco Use   Smoking status: Never   Smokeless tobacco: Never  Vaping Use   Vaping Use: Never used  Substance and Sexual Activity   Alcohol use: No    Alcohol/week: 0.0 standard drinks   Drug use: No   Sexual activity: Not on file  Other Topics Concern   Not on file  Social History Narrative   Lives at [facility] in independent living section.    Social Determinants of Health   Financial Resource Strain: Not on file  Food Insecurity: Not on file  Transportation Needs: Not on file  Physical Activity: Not on file  Stress: Not on file  Social Connections: Not on file     Family History: The patient's family history includes Heart disease in her father; Tuberculosis in her mother. There is no history of Breast cancer.  ROS:   Please see the history of present illness.     All other systems reviewed and are negative.  EKGs/Labs/Other Studies Reviewed:    The following studies were reviewed today:  Cardiac cath 03/14/21   Prox Cx lesion is 90% stenosed.   Prox RCA lesion is 85% stenosed.   Non-stenotic Mid LAD lesion was previously treated.   A drug-eluting stent was successfully placed using a STENT ONYX FRONTIER 3.5X15.   A drug-eluting stent was successfully placed using a STENT ONYX FRONTIER 3.0X38.   Post intervention, there is a 0%  residual stenosis.   Post intervention, there is a 0% residual stenosis.   Successful angioplasty and drug-eluting stent placement to both left circumflex and right coronary artery.   Recommendations: Continue dual antiplatelet therapy as planned for 1 year. The patient has not been able to take care of herself at independent living.  I consulted case management for assisted living placement.     Echo 02/15/21  1. Left ventricular ejection fraction, by estimation, is 30 to 35%. The  left ventricle has moderately decreased function. The left ventricle  demonstrates regional wall motion abnormalities (see scoring  diagram/findings for description). There is mild  left ventricular hypertrophy. Left ventricular diastolic parameters are  consistent with Grade I diastolic dysfunction (impaired relaxation).  Elevated left atrial pressure.   2. Right  ventricular systolic function is normal. The right ventricular  size is normal.   3. A small pericardial effusion is present.   4. The mitral valve is degenerative. Trivial mitral valve regurgitation.  No evidence of mitral stenosis.   5. The aortic valve was not well visualized. Aortic valve regurgitation  is trivial. No aortic stenosis is present.    Cardiac cath 02/14/21   Mid LAD lesion is 100% stenosed.   Prox Cx lesion is 90% stenosed.   Prox RCA lesion is 85% stenosed.   A drug-eluting stent was successfully placed using a STENT ONYX FRONTIER 3.0X26.   Post intervention, there is a 0% residual stenosis.   1.  Significant three-vessel coronary artery disease.  The culprit for anterior STEMI is an occluded mid LAD. 2.  Left ventricular angiography was not performed. 3.  Moderately to severely elevated left ventricular end-diastolic pressure at 30 mmHg. 4.  Successful angioplasty and drug-eluting stent placement to the mid LAD.  A second overlapped stent was used distally due to suspected distal edge dissection   Recommendations: Dual  antiplatelet therapy for at least 12 months. Aggressive treatment of risk factors. Obtain an echocardiogram to evaluate ejection fraction.  I started small dose carvedilol with plans to start an ACE inhibitor or ARB. Recommend staged PCI of the left circumflex and right coronary artery in few weeks.  EKG:  EKG is not ordered today.   Recent Labs: 02/14/2021: ALT 21 03/16/2021: Hemoglobin 14.2; Magnesium 1.9; Platelets 157 05/02/2021: BUN 19; Creatinine, Ser 0.91; Potassium 4.2; Sodium 142  Recent Lipid Panel No results found for: CHOL, TRIG, HDL, CHOLHDL, VLDL, LDLCALC, LDLDIRECT  Physical Exam:    VS:  BP 120/78 (BP Location: Left Arm, Patient Position: Sitting, Cuff Size: Normal)   Pulse 88   Ht 5\' 6"  (1.676 m)   Wt 128 lb (58.1 kg)   SpO2 98%   BMI 20.66 kg/m     Wt Readings from Last 3 Encounters:  06/06/21 128 lb (58.1 kg)  04/22/21 129 lb 4 oz (58.6 kg)  03/29/21 134 lb (60.8 kg)     GEN:  Well nourished, well developed in no acute distress HEENT: Normal NECK: No JVD; No carotid bruits LYMPHATICS: No lymphadenopathy CARDIAC: RRR, no murmurs, rubs, gallops RESPIRATORY:  Clear to auscultation without rales, wheezing or rhonchi  ABDOMEN: Soft, non-tender, non-distended MUSCULOSKELETAL:  No edema; No deformity  SKIN: Warm and dry NEUROLOGIC:  Alert and oriented x 3 PSYCHIATRIC:  Normal affect   ASSESSMENT:    1. Coronary artery disease involving coronary bypass graft of native heart without angina pectoris   2. Chronic systolic heart failure ([place])   3. Ischemic cardiomyopathy   4. Essential hypertension   5. Hyperlipidemia, mixed    PLAN:    In order of problems listed above:  CAD s/p anterior STEMI s/p DES x2 mLAD with recent staged PCI to pCx andpRCA No anginal symptoms. Continue DAPT with ASA and Brilinta x 1 year. Continue Coreg and statin. No further ischemic work-up at this time.   HFimpEF ICM Repeat echo showed improved EF 55%. Patient is euvolemic on  exam. Continue Coreg, Entresto, Jardiance, spironolactone. No further changes at this point.   HTN BP good today. Continue current medications.   HLD LDL 35. Continue statin  Disposition: Follow up in 3 month(s) with MD/APP    Signed, Aleigha Gilani Ninfa Meeker, PA-C  06/06/2021 9:51 AM    [facility]

## 2021-06-13 ENCOUNTER — Other Ambulatory Visit: Payer: Medicare Other

## 2021-07-04 ENCOUNTER — Other Ambulatory Visit: Payer: Self-pay | Admitting: Cardiovascular Disease

## 2021-08-08 ENCOUNTER — Other Ambulatory Visit: Payer: Self-pay | Admitting: Cardiovascular Disease

## 2021-08-30 ENCOUNTER — Encounter: Payer: Self-pay | Admitting: Cardiovascular Disease

## 2021-08-30 ENCOUNTER — Ambulatory Visit (INDEPENDENT_AMBULATORY_CARE_PROVIDER_SITE_OTHER): Payer: Medicare Other | Admitting: Cardiovascular Disease

## 2021-08-30 ENCOUNTER — Other Ambulatory Visit: Payer: Self-pay

## 2021-08-30 VITALS — BP 150/90 | HR 73 | Ht 66.0 in | Wt 125.0 lb

## 2021-08-30 DIAGNOSIS — I10 Essential (primary) hypertension: Secondary | ICD-10-CM | POA: Diagnosis not present

## 2021-08-30 DIAGNOSIS — I2510 Atherosclerotic heart disease of native coronary artery without angina pectoris: Secondary | ICD-10-CM

## 2021-08-30 DIAGNOSIS — E785 Hyperlipidemia, unspecified: Secondary | ICD-10-CM

## 2021-08-30 DIAGNOSIS — I5022 Chronic systolic (congestive) heart failure: Secondary | ICD-10-CM | POA: Diagnosis not present

## 2021-08-30 NOTE — Patient Instructions (Signed)

## 2021-08-30 NOTE — Progress Notes (Signed)
?  ?Cardiology Office Note ? ? ?Date:  08/30/2021  ? ?ID:  Tanya Roy, DOB Nov 19, 1928, MRN [id number] ? ?PCP:  Tanya Late, MD  ?Cardiologist:   Tanya Sacramento, MD  ? ?Chief Complaint  ?Patient presents with  ? OTher  ?  3 month follow up. Meds reviewed verbally with patient.   ? ? ?  ?History of Present Illness: ?Tanya Roy is a 88 y.o. female who presents for a follow-up visit regarding coronary artery disease and chronic systolic heart failure. ?She has known history of essential hypertension and hyperlipidemia. ?She was hospitalized in August 2022 with anterior ST elevation myocardial infarction.  Cardiac catheterization showed occluded mid LAD which was the culprit and was treated successfully with 2 drug-eluting stent placement.  She had significant obstructive disease involving the left circumflex and right coronary artery which were treated with staged PCI.  Echocardiogram showed an EF of 30 to 35%.  Subsequent repeat echocardiogram showed improved ejection fraction to 55%. ? ?She lives in an independent living facility.  She has been doing extremely well and denies any chest pain, shortness of breath or palpitations.  No side effects with medications.  No dizziness.  Her blood pressure is elevated today but usually it is more controlled. ? ? ?Past Medical History:  ?Diagnosis Date  ? Abnormal mammogram of right breast 06/26/2010  ? Microcalcifications  ? Arthritis   ? Confusion 02/14/2021  ? start before STEMI acc. to sister [name]  ? Osteoporosis   ? ? ?Past Surgical History:  ?Procedure Laterality Date  ? APPENDECTOMY    ? BREAST BIOPSY Right 06/27/2011  ? stereo, neg  ? CARDIAC CATHETERIZATION    ? CORONARY STENT INTERVENTION N/A 03/14/2021  ? Procedure: CORONARY STENT INTERVENTION;  Surgeon: Tanya Hampshire, MD;  Location: [facility];  Service: Cardiovascular;  Laterality: N/A;  ? CORONARY/GRAFT ACUTE MI REVASCULARIZATION N/A 02/14/2021  ? Procedure: Coronary/Graft Acute MI  Revascularization;  Surgeon: Tanya Hampshire, MD;  Location: [facility];  Service: Cardiovascular;  Laterality: N/A;  ? FEMUR IM NAIL Right 08/29/2016  ? Procedure: INTRAMEDULLARY (IM) NAIL FEMORAL;  Surgeon: Tanya Knows, MD;  Location: [facility];  Service: Orthopedics;  Laterality: Right;  ? LEFT HEART CATH AND CORONARY ANGIOGRAPHY N/A 02/14/2021  ? Procedure: LEFT HEART CATH AND CORONARY ANGIOGRAPHY;  Surgeon: Tanya Hampshire, MD;  Location: [facility];  Service: Cardiovascular;  Laterality: N/A;  ? MELANOMA EXCISION Right 03/26/2015  ? Procedure: MELANOMA EXCISION;  Surgeon: Tanya Pert, MD;  Location: [facility];  Service: General;  Laterality: Right;  ? ORIF WRIST FRACTURE Right 08/29/2016  ? Procedure: OPEN REDUCTION INTERNAL FIXATION (ORIF) WRIST FRACTURE;  Surgeon: Tanya Knows, MD;  Location: [facility];  Service: Orthopedics;  Laterality: Right;  ? SKIN BIOPSY  03/11/2015  ? Back  ? ? ? ?Current Outpatient Medications  ?Medication Sig Dispense Refill  ? aspirin EC 81 MG tablet Take 81 mg by mouth daily.    ? atorvastatin (LIPITOR) 80 MG tablet TAKE 1 TABLET BY MOUTH AT BEDTIME 90 tablet 3  ? BRILINTA 90 MG TABS tablet TAKE 1 TABLET BY MOUTH 2 TIMES A DAY 180 tablet 0  ? carvedilol (COREG) 3.125 MG tablet TAKE 1 TABLET BY MOUTH 2 TIMES A DAY WITH A MEAL 180 tablet 0  ? Cholecalciferol 125 MCG (5000 UT) TABS TAKE 1 TABLET BY MOUTH ONCE DAILY FOR SUPPLEMENT    ? empagliflozin (JARDIANCE) 10 MG TABS tablet Take 1  tablet (10 mg total) by mouth daily before breakfast. 90 tablet 0  ? Multiple Vitamin (MULTIVITAMIN) tablet Take 1 tablet by mouth daily.    ? nitroGLYCERIN (NITROSTAT) 0.4 MG SL tablet Place 1 tablet (0.4 mg total) under the tongue every 5 (five) minutes as needed for chest pain. 25 tablet 3  ? sacubitril-valsartan (ENTRESTO) 24-26 MG TAKE 1 TABLE BY MOUTH 2 TIMES DAILY 180 tablet 3  ? spironolactone (ALDACTONE) 25 MG tablet Take 0.5 tablets (12.5 mg total) by mouth daily. 45  tablet 3  ? Vitamin D, Ergocalciferol, (DRISDOL) 50000 units CAPS capsule Take 50,000 Units by mouth every 7 (seven) days.    ? ?No current facility-administered medications for this visit.  ? ? ?Allergies:   Patient has no known allergies.  ? ? ?Social History:  The patient  reports that she has never smoked. She has never used smokeless tobacco. She reports that she does not drink alcohol and does not use drugs.  ? ?Family History:  The patient's family history includes Heart disease in her father; Tuberculosis in her mother.  ? ? ?ROS:  Please see the history of present illness.   Otherwise, review of systems are positive for none.   All other systems are reviewed and negative.  ? ? ?PHYSICAL EXAM: ?VS:  BP (!) 150/90 (BP Location: Left Arm, Patient Position: Sitting, Cuff Size: Normal)   Pulse 73   Ht '5\' 6"'$  (1.676 m)   Wt 125 lb (56.7 kg)   SpO2 98%   BMI 20.18 kg/m?  , BMI Body mass index is 20.18 kg/m?. ?GEN: Well nourished, well developed, in no acute distress  ?HEENT: normal  ?Neck: no JVD, carotid bruits, or masses ?Cardiac: RRR; no murmurs, rubs, or gallops,no edema  ?Respiratory:  clear to auscultation bilaterally, normal work of breathing ?GI: soft, nontender, nondistended, + BS ?MS: no deformity or atrophy  ?Skin: warm and dry, no rash ?Neuro:  Strength and sensation are intact ?Psych: euthymic mood, full affect ? ? ?EKG:  EKG is ordered today. ?The ekg ordered today demonstrates normal sinus rhythm with prior septal infarct. ? ? ?Recent Labs: ?02/14/2021: ALT 21 ?03/16/2021: Hemoglobin 14.2; Magnesium 1.9; Platelets 157 ?05/02/2021: BUN 19; Creatinine, Ser 0.91; Potassium 4.2; Sodium 142  ? ? ?Lipid Panel ?No results found for: CHOL, TRIG, HDL, CHOLHDL, VLDL, LDLCALC, LDLDIRECT ?  ? ?Wt Readings from Last 3 Encounters:  ?08/30/21 125 lb (56.7 kg)  ?06/06/21 128 lb (58.1 kg)  ?04/22/21 129 lb 4 oz (58.6 kg)  ?  ? ? ?No flowsheet data found. ? ? ? ?ASSESSMENT AND PLAN: ? ?1.  Coronary artery disease  involving native coronary arteries without angina: She is doing extremely well with no anginal symptoms.  Recommend continuing ticagrelor until September of this year and then discontinued.  Continue aspirin indefinitely. ? ?2.  Chronic systolic heart failure due to ischemic cardiomyopathy with subsequent improvement in ejection fraction to normal.  She appears to be euvolemic.  Continue carvedilol, Jardiance, Entresto and spironolactone. ? ?3.  Essential hypertension: Blood pressure is mildly elevated but usually is more controlled.  If blood pressure continues to be elevated, we can consider increasing carvedilol. ? ?4.  Hyperlipidemia: She is tolerating high-dose atorvastatin with no side effects.  I reviewed most recent lipid profile done in October which showed an LDL of 35 and triglyceride of 79. ? ? ? ?Disposition:   FU with me in 6 months ? ?Signed, ? ?Tanya Sacramento, MD  ?08/30/2021 2:30 PM    ?  [place] ?

## 2021-09-01 ENCOUNTER — Telehealth: Payer: Self-pay | Admitting: *Deleted

## 2021-09-01 NOTE — Telephone Encounter (Signed)
PA HAS BEEN SUBMITTED FOR ENTRESTO 24-26 MG BID VIA [employer]. AWAITING ON APPROVAL AS NOTED.  ? ?LADREA HOLLADAY KeyFrederico Hamman - Rx #: [id number] ? ?Drug ?Entresto 24-'26MG'$  tablets ?Form ?[employer] [place] Medicare Part D Electronic Request Form (CB) ?Original Claim Info ?[id number] 01NON-PARTICIPATING PHARMACY;IF HIT OR LTC+02SUBMISSION, VERIFY NCPDP FIELDS AGAINST +03P ? ? ?Status ?Additional Information Required ? ? ?

## 2021-09-01 NOTE — Telephone Encounter (Signed)
This request has received a Cancelled outcome. ? ?This may mean either your patient does not have active coverage with this plan, this authorization was processed as a duplicate request, or an authorization was not needed for this medication. ? ?Note any additional information provided by [facility] at the bottom of this request, and contact [employer] [place] directly for further details. ? ?CALLED [facility], STATED MEDICATION IS READY FOR PATIENT TO PICK UP.  ?

## 2021-11-30 ENCOUNTER — Other Ambulatory Visit: Payer: Self-pay | Admitting: Cardiovascular Disease

## 2022-02-24 ENCOUNTER — Other Ambulatory Visit: Payer: Self-pay | Admitting: Medical

## 2022-03-07 ENCOUNTER — Ambulatory Visit: Payer: Medicare Other | Attending: Cardiovascular Disease | Admitting: Cardiovascular Disease

## 2022-03-07 ENCOUNTER — Encounter: Payer: Self-pay | Admitting: Cardiovascular Disease

## 2022-03-07 VITALS — BP 140/80 | HR 91 | Ht 64.0 in | Wt 125.0 lb

## 2022-03-07 DIAGNOSIS — I5022 Chronic systolic (congestive) heart failure: Secondary | ICD-10-CM | POA: Insufficient documentation

## 2022-03-07 DIAGNOSIS — E785 Hyperlipidemia, unspecified: Secondary | ICD-10-CM | POA: Diagnosis not present

## 2022-03-07 DIAGNOSIS — I10 Essential (primary) hypertension: Secondary | ICD-10-CM

## 2022-03-07 DIAGNOSIS — I2510 Atherosclerotic heart disease of native coronary artery without angina pectoris: Secondary | ICD-10-CM | POA: Insufficient documentation

## 2022-03-07 MED ORDER — CARVEDILOL 6.25 MG PO TABS: 6.2500 mg | ORAL_TABLET | Freq: Two times a day (BID) | ORAL | 3 refills | Status: DC

## 2022-03-07 NOTE — Progress Notes (Signed)
Cardiology Office Note   Date:  03/07/2022   ID:  Tanya Roy, DOB 1928/10/28, MRN [id number]  PCP:  Derinda Late, MD  Cardiologist:   Kathlyn Sacramento, MD   Chief Complaint  Patient presents with   Follow-up    6 month F/U-No new cardiac concerns      History of Present Illness: Tanya Roy is a 88 y.o. female who presents for a follow-up visit regarding coronary artery disease and chronic systolic heart failure. She has known history of essential hypertension and hyperlipidemia. She was hospitalized in August 2022 with anterior ST elevation myocardial infarction.  Cardiac catheterization showed occluded mid LAD which was the culprit and was treated successfully with 2 drug-eluting stent placement.  She had significant obstructive disease involving the left circumflex and right coronary artery which were treated with staged PCI.  Echocardiogram showed an EF of 30 to 35%.  Subsequent repeat echocardiogram showed improved ejection fraction to 55%.  She lives in an independent living facility.    She has been doing well with no chest pain, shortness of breath or palpitations.  No side effects with medications.  Past Medical History:  Diagnosis Date   Abnormal mammogram of right breast 06/26/2010   Microcalcifications   Arthritis    Confusion 02/14/2021   start before STEMI acc. to sister [name]   Osteoporosis     Past Surgical History:  Procedure Laterality Date   APPENDECTOMY     BREAST BIOPSY Right 06/27/2011   stereo, neg   CARDIAC CATHETERIZATION     CORONARY STENT INTERVENTION N/A 03/14/2021   Procedure: CORONARY STENT INTERVENTION;  Surgeon: Wellington Hampshire, MD;  Location: [facility];  Service: Cardiovascular;  Laterality: N/A;   CORONARY/GRAFT ACUTE MI REVASCULARIZATION N/A 02/14/2021   Procedure: Coronary/Graft Acute MI Revascularization;  Surgeon: Wellington Hampshire, MD;  Location: [facility];  Service: Cardiovascular;   Laterality: N/A;   FEMUR IM NAIL Right 08/29/2016   Procedure: INTRAMEDULLARY (IM) NAIL FEMORAL;  Surgeon: Hessie Knows, MD;  Location: [facility];  Service: Orthopedics;  Laterality: Right;   LEFT HEART CATH AND CORONARY ANGIOGRAPHY N/A 02/14/2021   Procedure: LEFT HEART CATH AND CORONARY ANGIOGRAPHY;  Surgeon: Wellington Hampshire, MD;  Location: [facility];  Service: Cardiovascular;  Laterality: N/A;   MELANOMA EXCISION Right 03/26/2015   Procedure: MELANOMA EXCISION;  Surgeon: Clayburn Pert, MD;  Location: [facility];  Service: General;  Laterality: Right;   ORIF WRIST FRACTURE Right 08/29/2016   Procedure: OPEN REDUCTION INTERNAL FIXATION (ORIF) WRIST FRACTURE;  Surgeon: Hessie Knows, MD;  Location: [facility];  Service: Orthopedics;  Laterality: Right;   SKIN BIOPSY  03/11/2015   Back     Current Outpatient Medications  Medication Sig Dispense Refill   aspirin EC 81 MG tablet Take 81 mg by mouth daily.     atorvastatin (LIPITOR) 80 MG tablet TAKE 1 TABLET BY MOUTH AT BEDTIME 90 tablet 3   BRILINTA 90 MG TABS tablet TAKE 1 TABLET BY MOUTH 2 TIMES A DAY 180 tablet 0   carvedilol (COREG) 3.125 MG tablet TAKE 1 TABLET BY MOUTH 2 TIMES A DAY WITH A MEAL 180 tablet 0   Cholecalciferol 125 MCG (5000 UT) TABS TAKE 1 TABLET BY MOUTH ONCE DAILY FOR SUPPLEMENT     empagliflozin (JARDIANCE) 10 MG TABS tablet TAKE 1 TABLET BY MOUTH DAILY BEFORE BREAKFAST 90 tablet 3   Multiple Vitamin (MULTIVITAMIN) tablet Take 1 tablet by mouth daily.  nitroGLYCERIN (NITROSTAT) 0.4 MG SL tablet Place 1 tablet (0.4 mg total) under the tongue every 5 (five) minutes as needed for chest pain. 25 tablet 3   sacubitril-valsartan (ENTRESTO) 24-26 MG TAKE 1 TABLE BY MOUTH 2 TIMES DAILY 180 tablet 3   spironolactone (ALDACTONE) 25 MG tablet Take 0.5 tablets (12.5 mg total) by mouth daily. 45 tablet 3   Vitamin D, Ergocalciferol, (DRISDOL) 50000 units CAPS capsule Take 50,000 Units by mouth every 7 (seven) days.      No current facility-administered medications for this visit.    Allergies:   Patient has no known allergies.    Social History:  The patient  reports that she has never smoked. She has never used smokeless tobacco. She reports that she does not drink alcohol and does not use drugs.   Family History:  The patient's family history includes Heart disease in her father; Tuberculosis in her mother.    ROS:  Please see the history of present illness.   Otherwise, review of systems are positive for none.   All other systems are reviewed and negative.    PHYSICAL EXAM: VS:  BP (!) 140/80 (BP Location: Left Arm, Patient Position: Sitting, Cuff Size: Normal)   Pulse 91   Ht '5\' 4"'$  (1.626 m)   Wt 125 lb (56.7 kg)   SpO2 98%   BMI 21.46 kg/m  , BMI Body mass index is 21.46 kg/m. GEN: Well nourished, well developed, in no acute distress  HEENT: normal  Neck: no JVD, carotid bruits, or masses Cardiac: RRR; no murmurs, rubs, or gallops,no edema  Respiratory:  clear to auscultation bilaterally, normal work of breathing GI: soft, nontender, nondistended, + BS MS: no deformity or atrophy  Skin: warm and dry, no rash Neuro:  Strength and sensation are intact Psych: euthymic mood, full affect   EKG:  EKG is ordered today. The ekg ordered today demonstrates sinus rhythm with PACs and old septal infarct.   Recent Labs: 03/16/2021: Hemoglobin 14.2; Magnesium 1.9; Platelets 157 05/02/2021: BUN 19; Creatinine, Ser 0.91; Potassium 4.2; Sodium 142    Lipid Panel No results found for: "CHOL", "TRIG", "HDL", "CHOLHDL", "VLDL", "LDLCALC", "LDLDIRECT"    Wt Readings from Last 3 Encounters:  03/07/22 125 lb (56.7 kg)  08/30/21 125 lb (56.7 kg)  06/06/21 128 lb (58.1 kg)          No data to display            ASSESSMENT AND PLAN:  1.  Coronary artery disease involving native coronary arteries without angina: She is doing extremely well with no anginal symptoms.  It has been more  than 1 year since most recent PCI.  Thus, I discontinued Brilinta today.  Continue aspirin indefinitely.  2.  Chronic systolic heart failure due to ischemic cardiomyopathy with subsequent improvement in ejection fraction to normal.  She appears to be euvolemic.  Continue carvedilol, Jardiance, Entresto and spironolactone.  3.  Essential hypertension: Blood pressure remains elevated.  I increase carvedilol to 6.25 mg twice daily.  4.  Hyperlipidemia: I reviewed most recent labs done in March which showed an LDL of 43.  Continue high-dose atorvastatin.    Disposition:   FU with me in 6 months  Signed,  Kathlyn Sacramento, MD  03/07/2022 1:50 PM    [facility]

## 2022-03-07 NOTE — Patient Instructions (Signed)
Medication Instructions:  Stop Brilinta  Increase Carvedilol (Coreg) to 6.25 mg twice a day   *If you need a refill on your cardiac medications before your next appointment, please call your pharmacy*   Lab Work: None ordered   If you have labs (blood work) drawn today and your tests are completely normal, you will receive your results only by: [place] (if you have MyChart) OR A paper copy in the mail If you have any lab test that is abnormal or we need to change your treatment, we will call you to review the results.   Testing/Procedures: None ordered   Follow-Up: At [facility], you and your health needs are our priority.  As part of our continuing mission to provide you with exceptional heart care, we have created designated Provider Care Teams.  These Care Teams include your primary Cardiologist (physician) and Advanced Practice Providers (APPs -  Physician Assistants and Nurse Practitioners) who all work together to provide you with the care you need, when you need it.  We recommend signing up for the patient portal called "MyChart".  Sign up information is provided on this After Visit Summary.  MyChart is used to connect with patients for Virtual Visits (Telemedicine).  Patients are able to view lab/test results, encounter notes, upcoming appointments, etc.  Non-urgent messages can be sent to your provider as well.   To learn more about what you can do with MyChart, go to [email or web address].    Your next appointment:   6 month(s)  The format for your next appointment:   In Person  Provider:   You may see Dr. Kathlyn Sacramento or one of the following Advanced Practice Providers on your designated Care Team:   Murray Hodgkins, NP Christell Faith, PA-C Cadence Kathlen Mody, PA-C Gerrie Nordmann, NP    Other Instructions   Important Information About Sugar

## 2022-05-14 IMAGING — DX DG CHEST 1V PORT
1 series · 1 of 1 positions shown · non-contrast
Comparison: August 29, 2016.

CLINICAL DATA: Chest pain.

EXAM:
PORTABLE CHEST 1 VIEW

[chest ap]
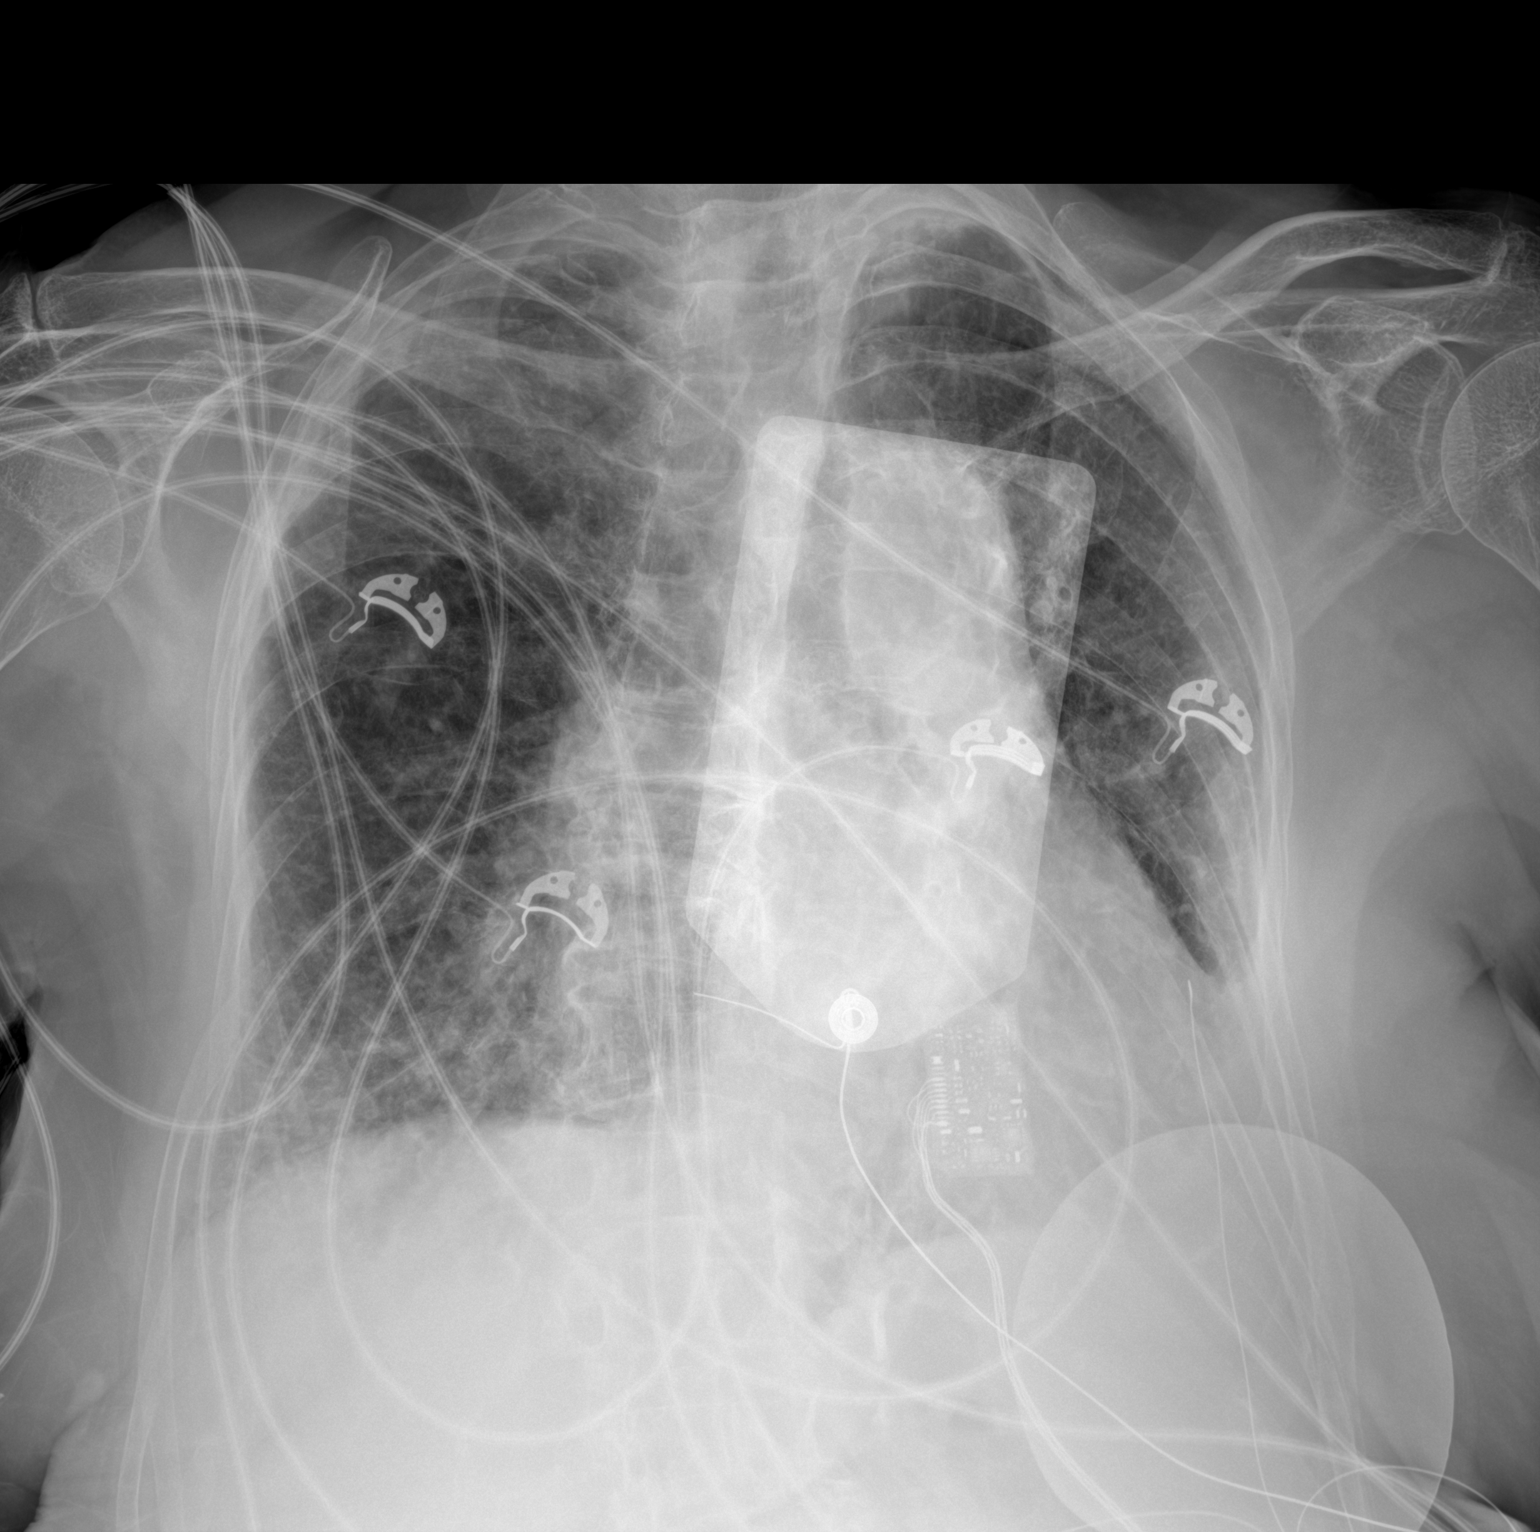

[1 of 1 positions shown; findings below may reference images not displayed]

FINDINGS: Stable cardiomegaly. No pneumothorax is noted. Mild bibasilar
interstitial densities are noted concerning for pulmonary edema. No
definite pleural effusion is noted. Bony thorax is unremarkable.
IMPRESSION: Stable cardiomegaly. Mild bibasilar interstitial densities are noted
concerning for possible pulmonary edema.

## 2022-07-31 ENCOUNTER — Other Ambulatory Visit: Payer: Self-pay | Admitting: Cardiovascular Disease

## 2022-07-31 NOTE — Telephone Encounter (Signed)
Please schedule F/U appointment for 90 day refills. Thank you!

## 2022-09-15 ENCOUNTER — Telehealth: Payer: Self-pay | Admitting: Cardiovascular Disease

## 2022-09-15 NOTE — Telephone Encounter (Signed)
Faxed current medication list to [place] phone [phone]. Fax: [phone] attention Sarah or York Cerise. Successful fax transmittal received.

## 2022-09-15 NOTE — Telephone Encounter (Signed)
Niece stated patient will be going into assisted living and they will need to get current list of medications patient should be taking.

## 2022-09-15 NOTE — Telephone Encounter (Signed)
Left message on voicemail stating that her last office visit [facility] has a listing of current medication on the back page or they can come to the clinic and we can give them the last after visit summary with the medication list

## 2022-09-15 NOTE — Telephone Encounter (Signed)
Niece returned RN's call.  Niece requested patient's medication list be faxed to [phone], Attn:  Sarah or York Cerise.

## 2022-09-15 NOTE — Telephone Encounter (Signed)
Pt. Of Dr. Fletcher Anon, routing to correct triage pool

## 2022-11-15 NOTE — Progress Notes (Unsigned)
Cardiology Office Note   Date:  11/16/2022   ID:  Tanya Roy, DOB [birth year], MRN [id number]  PCP:  Kandyce Rud, MD  Cardiologist:   Lorine Bears, MD   Chief Complaint  Patient presents with   Follow-up    6 Month f/u c/o Elevated BP and edema ankles/legs. Meds reviewed verbally with pt.      History of Present Illness: Tanya Roy is a 89 y.o. female who presents for a follow-up visit regarding coronary artery disease and chronic systolic heart failure. She has known history of essential hypertension and hyperlipidemia. She was hospitalized in August 2022 with anterior ST elevation myocardial infarction.  Cardiac catheterization showed occluded mid LAD which was the culprit and was treated successfully with 2 drug-eluting stent placement.  She had significant obstructive disease involving the left circumflex and right coronary artery which were treated with staged PCI.  Echocardiogram showed an EF of 30 to 35%.  Subsequent repeat echocardiogram showed improved ejection fraction to 55%.  She lives in an independent living facility but moved to the assisted living side.  Brilinta was discontinued during last visit.  She has been doing well with no chest pain, shortness of breath or palpitations.  Her blood pressure has been on the low side at home with intermittent dizziness.  Past Medical History:  Diagnosis Date   Abnormal mammogram of right breast 06/26/2010   Microcalcifications   Arthritis    Confusion 02/14/2021   start before STEMI acc. to sister Malachi Bonds   Osteoporosis     Past Surgical History:  Procedure Laterality Date   APPENDECTOMY     BREAST BIOPSY Right 06/27/2011   stereo, neg   CARDIAC CATHETERIZATION     CORONARY STENT INTERVENTION N/A 03/14/2021   Procedure: CORONARY STENT INTERVENTION;  Surgeon: Iran Ouch, MD;  Location: [facility];  Service: Cardiovascular;  Laterality: N/A;   CORONARY/GRAFT ACUTE MI REVASCULARIZATION  N/A 02/14/2021   Procedure: Coronary/Graft Acute MI Revascularization;  Surgeon: Iran Ouch, MD;  Location: [facility];  Service: Cardiovascular;  Laterality: N/A;   FEMUR IM NAIL Right 08/29/2016   Procedure: INTRAMEDULLARY (IM) NAIL FEMORAL;  Surgeon: Kennedy Bucker, MD;  Location: [facility] OR[facility];  Service: Orthopedics;  Laterality: Right;   LEFT HEART CATH AND CORONARY ANGIOGRAPHY N/A 02/14/2021   Procedure: LEFT HEART CATH AND CORONARY ANGIOGRAPHY;  Surgeon: Iran Ouch, MD;  Location: [facility];  Service: Cardiovascular;  Laterality: N/A;   MELANOMA EXCISION Right 03/26/2015   Procedure: MELANOMA EXCISION;  Surgeon: Ricarda Frame, MD;  Location: [facility] OR[facility];  Service: General;  Laterality: Right;   ORIF WRIST FRACTURE Right 08/29/2016   Procedure: OPEN REDUCTION INTERNAL FIXATION (ORIF) WRIST FRACTURE;  Surgeon: Kennedy Bucker, MD;  Location: [facility] OR[facility];  Service: Orthopedics;  Laterality: Right;   SKIN BIOPSY  03/11/2015   Back     Current Outpatient Medications  Medication Sig Dispense Refill   aspirin EC 81 MG tablet Take 81 mg by mouth daily.     atorvastatin (LIPITOR) 80 MG tablet TAKE 1 TABLET BY MOUTH AT BEDTIME 90 tablet 3   carvedilol (COREG) 6.25 MG tablet Take 1 tablet (6.25 mg total) by mouth 2 (two) times daily. 180 tablet 3   Cholecalciferol 125 MCG (5000 UT) TABS TAKE 1 TABLET BY MOUTH ONCE DAILY FOR SUPPLEMENT     empagliflozin (JARDIANCE) 10 MG TABS tablet TAKE 1 TABLET BY MOUTH DAILY BEFORE BREAKFAST 90 tablet 3  ENTRESTO 24-26 MG TAKE 1 TABLE BY MOUTH 2 TIMES DAILY 180 tablet 3   Multiple Vitamin (MULTIVITAMIN) tablet Take 1 tablet by mouth daily.     nitroGLYCERIN (NITROSTAT) 0.4 MG SL tablet Place 1 tablet (0.4 mg total) under the tongue every 5 (five) minutes as needed for chest pain. 25 tablet 3   Vitamin D, Ergocalciferol, (DRISDOL) 50000 units CAPS capsule Take 50,000 Units by mouth every 7 (seven) days.     No current  facility-administered medications for this visit.    Allergies:   Patient has no known allergies.    Social History:  The patient  reports that she has never smoked. She has never used smokeless tobacco. She reports that she does not drink alcohol and does not use drugs.   Family History:  The patient's family history includes Heart disease in her father; Tuberculosis in her mother.    ROS:  Please see the history of present illness.   Otherwise, review of systems are positive for none.   All other systems are reviewed and negative.    PHYSICAL EXAM: VS:  BP (!) 148/80 (BP Location: Left Arm, Patient Position: Sitting, Cuff Size: Normal)   Pulse 65   Ht 5\' 6"  (1.676 m)   Wt 123 lb 8 oz (56 kg)   SpO2 97%   BMI 19.93 kg/m  , BMI Body mass index is 19.93 kg/m. GEN: Well nourished, well developed, in no acute distress  HEENT: normal  Neck: no JVD, carotid bruits, or masses Cardiac: RRR; no murmurs, rubs, or gallops, trace bilateral leg edema. Respiratory:  clear to auscultation bilaterally, normal work of breathing GI: soft, nontender, nondistended, + BS MS: no deformity or atrophy  Skin: warm and dry, no rash Neuro:  Strength and sensation are intact Psych: euthymic mood, full affect   EKG:  EKG is ordered today. The ekg ordered today demonstrates sinus rhythm with PACs and old septal infarct.   Recent Labs: No results found for requested labs within last 365 days.    Lipid Panel No results found for: "CHOL", "TRIG", "HDL", "CHOLHDL", "VLDL", "LDLCALC", "LDLDIRECT"    Wt Readings from Last 3 Encounters:  11/16/22 123 lb 8 oz (56 kg)  03/07/22 125 lb (56.7 kg)  08/30/21 125 lb (56.7 kg)          No data to display            ASSESSMENT AND PLAN:  1.  Coronary artery disease involving native coronary arteries without angina: She is doing extremely well with no anginal symptoms.  Continue low-dose aspirin indefinitely.  2.  Chronic systolic heart failure  due to ischemic cardiomyopathy with subsequent improvement in ejection fraction to normal.  She appears to be euvolemic.  Given intermittent hypotension and dizziness, I discontinued spironolactone today.  Continue carvedilol, Jardiance and Entresto.  3.  Essential hypertension: Blood pressure is mildly elevated today but her home blood pressure readings are on the low side with intermittent dizziness.  Small dose spironolactone was discontinued.  4.  Hyperlipidemia: I reviewed most recent labs done last month which showed an LDL of 27.  Continue atorvastatin.   Disposition:   FU with me in 6 months  Signed,  Lorine Bears, MD  11/16/2022 9:26 AM    [facility]

## 2022-11-16 ENCOUNTER — Encounter: Payer: Self-pay | Admitting: Cardiovascular Disease

## 2022-11-16 ENCOUNTER — Ambulatory Visit: Payer: Medicare Other | Attending: Cardiovascular Disease | Admitting: Cardiovascular Disease

## 2022-11-16 VITALS — BP 148/80 | HR 65 | Ht 66.0 in | Wt 123.5 lb

## 2022-11-16 DIAGNOSIS — E785 Hyperlipidemia, unspecified: Secondary | ICD-10-CM | POA: Diagnosis present

## 2022-11-16 DIAGNOSIS — I2510 Atherosclerotic heart disease of native coronary artery without angina pectoris: Secondary | ICD-10-CM | POA: Diagnosis not present

## 2022-11-16 DIAGNOSIS — I5022 Chronic systolic (congestive) heart failure: Secondary | ICD-10-CM | POA: Diagnosis present

## 2022-11-16 DIAGNOSIS — I10 Essential (primary) hypertension: Secondary | ICD-10-CM | POA: Diagnosis present

## 2022-11-16 NOTE — Patient Instructions (Signed)
Medication Instructions:  STOP the Spironolactone  *If you need a refill on your cardiac medications before your next appointment, please call your pharmacy*   Lab Work: None ordered If you have labs (blood work) drawn today and your tests are completely normal, you will receive your results only by: MyChart Message (if you have MyChart) OR A paper copy in the mail If you have any lab test that is abnormal or we need to change your treatment, we will call you to review the results.   Testing/Procedures: None ordered   Follow-Up: At [facility], you and your health needs are our priority.  As part of our continuing mission to provide you with exceptional heart care, we have created designated Provider Care Teams.  These Care Teams include your primary Cardiologist (physician) and Advanced Practice Providers (APPs -  Physician Assistants and Nurse Practitioners) who all work together to provide you with the care you need, when you need it.  We recommend signing up for the patient portal called "MyChart".  Sign up information is provided on this After Visit Summary.  MyChart is used to connect with patients for Virtual Visits (Telemedicine).  Patients are able to view lab/test results, encounter notes, upcoming appointments, etc.  Non-urgent messages can be sent to your provider as well.   To learn more about what you can do with MyChart, go to [email or web address].    Your next appointment:   6 month(s)  Provider:   You may see Dr. Kirke Corin or one of the following Advanced Practice Providers on your designated Care Team:   Nicolasa Ducking, NP Eula Listen, PA-C Cadence Fransico Michael, PA-C Charlsie Quest, NP

## 2022-12-18 ENCOUNTER — Emergency Department: Payer: Medicare Other

## 2022-12-18 ENCOUNTER — Other Ambulatory Visit: Payer: Self-pay

## 2022-12-18 ENCOUNTER — Emergency Department
Admission: EM | Admit: 2022-12-18 | Discharge: 2022-12-18 | Disposition: A | Discharge status: Home / Self Care | Payer: Medicare Other | Attending: Emergency Medicine | Admitting: Emergency Medicine

## 2022-12-18 ENCOUNTER — Encounter: Payer: Self-pay | Admitting: Emergency Medicine

## 2022-12-18 DIAGNOSIS — M79601 Pain in right arm: Secondary | ICD-10-CM | POA: Diagnosis present

## 2022-12-18 DIAGNOSIS — W01198A Fall on same level from slipping, tripping and stumbling with subsequent striking against other object, initial encounter: Secondary | ICD-10-CM | POA: Diagnosis not present

## 2022-12-18 DIAGNOSIS — S42294A Other nondisplaced fracture of upper end of right humerus, initial encounter for closed fracture: Secondary | ICD-10-CM | POA: Insufficient documentation

## 2022-12-18 DIAGNOSIS — S0101XA Laceration without foreign body of scalp, initial encounter: Secondary | ICD-10-CM | POA: Diagnosis not present

## 2022-12-18 DIAGNOSIS — W19XXXA Unspecified fall, initial encounter: Secondary | ICD-10-CM

## 2022-12-18 MED ORDER — HYDROCODONE-ACETAMINOPHEN 5-325 MG PO TABS: 1.0000 | ORAL_TABLET | ORAL | 0 refills | Status: AC | PRN

## 2022-12-18 NOTE — ED Provider Notes (Signed)
[facility] Provider Note  Patient Contact: 4:24 PM (approximate)   History   Fall   HPI  Daniell Paradise is a 89 y.o. female who presents emergency department after a fall.  Patient tripped, fell hitting her head and landing on her right side.  Patient is complaining of a laceration to the head, right shoulder/upper arm pain.  Patient has no loss of consciousness initially or subsequently.  Patient with no range of motion currently of the right upper extremity.  Denies any headache, vision changes, neck pain, back pain, hip pain, lower extremity injuries.  Again this was a mechanical fall.  Patient denies any other complaints preceding this fall.     Physical Exam   Triage Vital Signs: ED Triage Vitals  Enc Vitals Group     BP 12/18/22 1527 (!) 142/84     Pulse Rate 12/18/22 1527 61     Resp 12/18/22 1527 18     Temp 12/18/22 1527 97.9 F (36.6 C)     Temp Source 12/18/22 1527 Oral     SpO2 12/18/22 1527 96 %     Weight 12/18/22 1527 122 lb (55.3 kg)     Height 12/18/22 1527 5\' 6"  (1.676 m)     Head Circumference --      Peak Flow --      Pain Score 12/18/22 1504 7     Pain Loc --      Pain Edu? --      Excl. in GC? --     Most recent vital signs: Vitals:   12/18/22 1527  BP: (!) 142/84  Pulse: 61  Resp: 18  Temp: 97.9 F (36.6 C)  SpO2: 96%     General: Alert and in no acute distress. Eyes:  PERRL. EOMI. Head: Scalp laceration noted to the occipital skull.  Clotted at this time.  No gross hematomas identified, no other signs of trauma, battle signs, raccoon eyes or serosanguineous fluid drainage from ears or nares.  Small lack to the right occipital skull measuring approximately 1 and half centimeters in length.  Relatively superficial in nature.  No active bleeding. Neck: No stridor. No cervical spine tenderness to palpation  Cardiovascular:  Good peripheral perfusion Respiratory: Normal respiratory effort without tachypnea  or retractions. Lungs CTAB.  Musculoskeletal: Full range of motion to all extremities.  Visualization of the right upper extremity reveals what appears to be some edema about the right upper arm when compared to left.  Patient has very limited range of motion at this time secondary to pain.  No passive range of motion is attempted.  Tender through the humeral head, neck and mid shaft humerus.  No tenderness over the forearm or hand.  No other tenderness to osseous structures of the upper or lower extremities. Neurologic:  No gross focal neurologic deficits are appreciated.  Skin:   No rash noted Other:   ED Results / Procedures / Treatments   Labs (all labs ordered are listed, but only abnormal results are displayed) Labs Reviewed - No data to display   EKG     RADIOLOGY  I personally viewed, evaluated, and interpreted these images as part of my medical decision making, as well as reviewing the written report by the radiologist.  ED Provider Interpretation: Fracture of the proximal humerus with slight angulation without displacement.  CT scan of the head and neck are reassuring without skull fracture or intracranial hemorrhage.  DG Elbow 2 Views Right  Result  Date: 12/18/2022 CLINICAL DATA:  Trauma, fall EXAM: RIGHT ELBOW - 2 VIEW COMPARISON:  None Available. FINDINGS: No displaced fracture or dislocation is seen. Chondrocalcinosis is noted. Osteopenia is seen in bony structures. IMPRESSION: No recent displaced fracture or dislocation is seen in right elbow. Electronically Signed   By: Ernie Avena M.D.   On: 12/18/2022 17:07   DG Shoulder Right  Result Date: 12/18/2022 CLINICAL DATA:  Trauma, fall EXAM: RIGHT SHOULDER - 2+ VIEW COMPARISON:  None Available. FINDINGS: Recent fracture is seen in the neck of the proximal right humerus. There is mild overriding of fracture fragments. There is no dislocation in the shoulder joint. Osteopenia is seen the bony structures. IMPRESSION:  Recent, slightly angulated fracture is seen in the neck of proximal right humerus. Electronically Signed   By: Ernie Avena M.D.   On: 12/18/2022 17:06   CT HEAD WO CONTRAST ( )  Result Date: 12/18/2022 CLINICAL DATA:  Fall.  Patient tripped and fell on tile floor. EXAM: CT HEAD WITHOUT CONTRAST CT CERVICAL SPINE WITHOUT CONTRAST TECHNIQUE: Multidetector CT imaging of the head and cervical spine was performed following the standard protocol without intravenous contrast. Multiplanar CT image reconstructions of the cervical spine were also generated. RADIATION DOSE REDUCTION: This exam was performed according to the departmental dose-optimization program which includes automated exposure control, adjustment of the mA and/or kV according to patient size and/or use of iterative reconstruction technique. COMPARISON:  None Available. FINDINGS: CT HEAD FINDINGS Brain: No evidence of acute infarction, hemorrhage, hydrocephalus, extra-axial collection or mass lesion/mass effect. Vascular: No hyperdense vessel or unexpected calcification. Skull: Right parietal scalp hematoma measuring 0.8 x 0.9 x 1.2 cm. Negative for fracture or focal lesion. Sinuses/Orbits: No acute finding. Other: None. CT CERVICAL SPINE FINDINGS Alignment: Mild retrolisthesis of C3 Skull base and vertebrae: No acute fracture. No primary bone lesion or focal pathologic process. Soft tissues and spinal canal: No prevertebral fluid or swelling. No visible canal hematoma. Disc levels: Multilevel degenerate disc disease with disc height loss and osteophytes. C2-C3: No significant disc bulge, spinal canal or neural foraminal stenosis. C3-C4: Disc height loss and uncovertebral joint arthropathy. Moderate bilateral neural foraminal stenosis. Mild left facet joint arthropathy. C4-C5: Disc height loss and uncovertebral joint arthropathy. Mild bilateral facet joint arthropathy. No significant neural foraminal stenosis. C5-C6: Disc height loss and  uncovertebral joint arthropathy. Mild bilateral neural foraminal stenosis. Mild left facet joint arthropathy. C6-C7: Disc height loss and uncovertebral joint arthropathy. Mild left neural foraminal stenosis. C7-T1: Bilateral facet joint arthropathy. No significant spinal canal or neural foraminal stenosis. Upper chest: Biapical pleural/parenchymal scarring. Other: None IMPRESSION: CT HEAD: 1. No acute intracranial abnormality. 2. Right parietal scalp hematoma measuring 0.8 x 0.9 x 1.2 cm. No evidence of fracture. CT CERVICAL SPINE: 1. No acute fracture or traumatic subluxation. 2. Multilevel degenerate disc disease with disc height loss and osteophytes. Electronically Signed   By: Larose Hires D.O.   On: 12/18/2022 16:24   CT Cervical Spine Wo Contrast  Result Date: 12/18/2022 CLINICAL DATA:  Fall.  Patient tripped and fell on tile floor. EXAM: CT HEAD WITHOUT CONTRAST CT CERVICAL SPINE WITHOUT CONTRAST TECHNIQUE: Multidetector CT imaging of the head and cervical spine was performed following the standard protocol without intravenous contrast. Multiplanar CT image reconstructions of the cervical spine were also generated. RADIATION DOSE REDUCTION: This exam was performed according to the departmental dose-optimization program which includes automated exposure control, adjustment of the mA and/or kV according to patient size and/or use of iterative  reconstruction technique. COMPARISON:  None Available. FINDINGS: CT HEAD FINDINGS Brain: No evidence of acute infarction, hemorrhage, hydrocephalus, extra-axial collection or mass lesion/mass effect. Vascular: No hyperdense vessel or unexpected calcification. Skull: Right parietal scalp hematoma measuring 0.8 x 0.9 x 1.2 cm. Negative for fracture or focal lesion. Sinuses/Orbits: No acute finding. Other: None. CT CERVICAL SPINE FINDINGS Alignment: Mild retrolisthesis of C3 Skull base and vertebrae: No acute fracture. No primary bone lesion or focal pathologic process.  Soft tissues and spinal canal: No prevertebral fluid or swelling. No visible canal hematoma. Disc levels: Multilevel degenerate disc disease with disc height loss and osteophytes. C2-C3: No significant disc bulge, spinal canal or neural foraminal stenosis. C3-C4: Disc height loss and uncovertebral joint arthropathy. Moderate bilateral neural foraminal stenosis. Mild left facet joint arthropathy. C4-C5: Disc height loss and uncovertebral joint arthropathy. Mild bilateral facet joint arthropathy. No significant neural foraminal stenosis. C5-C6: Disc height loss and uncovertebral joint arthropathy. Mild bilateral neural foraminal stenosis. Mild left facet joint arthropathy. C6-C7: Disc height loss and uncovertebral joint arthropathy. Mild left neural foraminal stenosis. C7-T1: Bilateral facet joint arthropathy. No significant spinal canal or neural foraminal stenosis. Upper chest: Biapical pleural/parenchymal scarring. Other: None IMPRESSION: CT HEAD: 1. No acute intracranial abnormality. 2. Right parietal scalp hematoma measuring 0.8 x 0.9 x 1.2 cm. No evidence of fracture. CT CERVICAL SPINE: 1. No acute fracture or traumatic subluxation. 2. Multilevel degenerate disc disease with disc height loss and osteophytes. Electronically Signed   By: Larose Hires D.O.   On: 12/18/2022 16:24    PROCEDURES:  Critical Care performed: No  Procedures   MEDICATIONS ORDERED IN ED: Medications - No data to display   IMPRESSION / MDM / ASSESSMENT AND PLAN / ED COURSE  I reviewed the triage vital signs and the nursing notes.                                 Differential diagnosis includes, but is not limited to, fall, scalp laceration, skull fracture, intracranial hemorrhage, cervical spine injury, shoulder fracture, elbow fracture  Patient's presentation is most consistent with acute presentation with potential threat to life or bodily function.   Patient's diagnosis is consistent with fall, scalp laceration,  proximal humeral fracture.  Patient presents emergency department after mechanical fall.  She did hit her head but did not lose consciousness.  Imaging of the head and neck are reassuring.  Small laceration that did not require primary closure noted on exam.  Wound care instructions discussed with patient and daughters.  Patient also had right arm injury and imaging revealed a proximal humerus fracture with angulation without displacement.  Sling applied, pain medication sent with instructions on use with daughters.  Follow-up with orthopedics. Patient is given ED precautions to return to the ED for any worsening or new symptoms.     FINAL CLINICAL IMPRESSION(S) / ED DIAGNOSES   Final diagnoses:  Fall, initial encounter  Other closed nondisplaced fracture of proximal end of right humerus, initial encounter  Laceration of scalp, initial encounter     Rx / DC Orders   ED Discharge Orders          Ordered    HYDROcodone-acetaminophen (NORCO/VICODIN) 5-325 MG tablet  Every 4 hours PRN        12/18/22 1804             Note:  This document was prepared using Dragon voice recognition software and may  include unintentional dictation errors.   Lanette Hampshire 12/18/22 1806    Minna Antis, MD 12/18/22 2252

## 2022-12-18 NOTE — Discharge Instructions (Signed)
Patient has sustained a proximal humerus/shoulder fracture.  Will require sling immobilizer for immobilization.  Patient may need narcotic pain medication for pain relief for the next day or 2.  If narcotic is used, recommend close observation for the next 4 to 6 hours to ensure that patient is able to ambulate safely.  Narcotic pain medication can make the patient sleepy drowsy and groggy or unsteady on her feet.  Follow-up with orthopedics as patient will require physical therapy.

## 2022-12-18 NOTE — ED Triage Notes (Signed)
Pt comes via [facility] from [place] with c/o fall. Pt tripped and fell onto tile floor. Pt states right arm pain. Pt denies any loc but did hit her head. No thinners.

## 2023-01-16 NOTE — Progress Notes (Signed)
 Chief Complaint Chief Complaint  Patient presents with   Arm Injury    RIGHT HUMERUS INJURY; DOI: 12/18/2022    Reason for Visit  Tanya Roy is a 89 y.o. who presents today by referral [facility] emergency department.  Patient was at [facility] on 12/18/2022 when she lost her balance and fell injuring the shoulder.  Was seen in the emergency room where x-rays revealed fracture.  Was fitted with a sling.  Presents today for follow-up evaluation.  Denies any pain radiating down the arm.  Denies any numbness, or tingling sensation.  Is right-hand dominant.  Is improving.  Has been wearing sling as instructed.  Is currently at home place.  Past Medical History Past Medical History:  Diagnosis Date   History of abnormal mammogram 2012   Right breast, microcalcifications   Hyperlipidemia    Hypertension    Osteoarthritis    Osteoporosis     Past Surgical History Past Surgical History:  Procedure Laterality Date   HYSTERECTOMY  1979   Needle biopsy right breast  2012   Benign findings of microcalcifications and benign glands, some nodular fibrous tissue, and also some coarse calcifications.    melanoma  2016   Right upper back    Femur ORIF  08/29/2016   ORIF right wrist  08/29/2016   APPENDECTOMY      Past Family History Family History  Problem Relation Age of Onset   Tuberculosis Mother    Myocardial Infarction (Heart attack) Father    Breast cancer Cousin     Medications Current Outpatient Medications  Medication Sig Dispense Refill   aspirin  81 MG EC tablet TAKE 1 TABLET BY MOUTH EVERY DAY 30 tablet 1   atorvastatin  (LIPITOR ) 80 MG tablet TAKE 1 TABLET BY MOUTH AT BEDTIME 30 tablet 1   carvediloL  (COREG ) 3.125 MG tablet Take 3.125 mg by mouth 2 (two) times daily     carvediloL  (COREG ) 6.25 MG tablet Take 6.25 mg by mouth 2 (two) times daily with meals     empagliflozin  (JARDIANCE ) 10 mg tablet Take 10 mg by mouth once daily     fluticasone  propionate (FLONASE) 50 mcg/actuation nasal spray Place 2 sprays into both nostrils once daily 16 g 2   multivitamin tablet TAKE ONE TABLET BY MOUTH ONCE DAILY 30 tablet 1   nitroGLYcerin  (NITROSTAT ) 0.4 MG SL tablet Place 0.4 mg under the tongue every 5 (five) minutes as needed for Chest pain May take up to 3 doses.     sacubitriL -valsartan  (ENTRESTO ) 24-26 mg tablet Take 1 tablet by mouth every 12 (twelve) hours     spironolactone  (ALDACTONE ) 25 MG tablet TAKE 1/2 TABLET BY MOUTH EVERY DAY 15 tablet 1   ticagrelor  (BRILINTA ) 90 mg tablet Take 1 tablet (90 mg total) by mouth every 12 (twelve) hours 180 tablet 1   VITAMIN D3 125 mcg (5,000 unit) tablet TAKE 1 TABLET BY MOUTH ONCE DAILY FOR SUPPLEMENT 30 tablet 5   No current facility-administered medications for this visit.    Allergies No Known Allergies   Review of Systems A comprehensive 14 point ROS was performed, reviewed, and the pertinent orthopaedic findings are documented in the HPI.   Exam  BP 130/70 (BP Location: Left upper arm, Patient Position: Sitting, BP Cuff Size: Adult)   Ht 157.5 cm (5' 2)   Wt 55.8 kg (123 lb)   BMI 22.50 kg/m   Patient has full range of motion of the right elbow.  No pain noted with range  of motion.  She has approximately 85 degrees of abduction and flexion passively without any pain.  She is actually able to raise the arm herself without any pain.  Neurovascular and neurosensory appear to be intact and within normal limits.  X-ray  1.  2 views of the right shoulder ordered and interpreted on today's visit shows a displaced humeral head but has significant callus formation.  Impression  1.  Fracture proximal right humerus  Plan    1.  May discontinue the sling 2.  With the amount of callus formation she has and the fact she has no pain with range of motion she may begin using the right shoulder as tolerated. 3.  I do not need to see her back unless she has any additional issues. 4.   We discussed surgery on a 89 year old.  This note was generated in part with voice recognition software and I apologize for any typographical errors that were not detected and corrected    Thom JONELLE Geralds PA

## 2023-03-31 ENCOUNTER — Other Ambulatory Visit: Payer: Self-pay | Admitting: Cardiovascular Disease

## 2023-05-31 ENCOUNTER — Ambulatory Visit
Admission: RE | Admit: 2023-05-31 | Discharge: 2023-05-31 | Disposition: A | Discharge status: Home / Self Care | Payer: Medicare Other | Source: Ambulatory Visit | Attending: Cardiovascular Disease | Admitting: Cardiovascular Disease

## 2023-05-31 ENCOUNTER — Ambulatory Visit: Payer: Medicare Other | Attending: Cardiovascular Disease | Admitting: Cardiovascular Disease

## 2023-05-31 ENCOUNTER — Encounter: Payer: Self-pay | Admitting: Cardiovascular Disease

## 2023-05-31 VITALS — BP 160/70 | HR 56 | Ht 65.0 in | Wt 112.0 lb

## 2023-05-31 DIAGNOSIS — M25532 Pain in left wrist: Secondary | ICD-10-CM

## 2023-05-31 DIAGNOSIS — I5022 Chronic systolic (congestive) heart failure: Secondary | ICD-10-CM | POA: Insufficient documentation

## 2023-05-31 DIAGNOSIS — E785 Hyperlipidemia, unspecified: Secondary | ICD-10-CM | POA: Insufficient documentation

## 2023-05-31 DIAGNOSIS — I255 Ischemic cardiomyopathy: Secondary | ICD-10-CM | POA: Diagnosis present

## 2023-05-31 DIAGNOSIS — I10 Essential (primary) hypertension: Secondary | ICD-10-CM | POA: Insufficient documentation

## 2023-05-31 DIAGNOSIS — I25810 Atherosclerosis of coronary artery bypass graft(s) without angina pectoris: Secondary | ICD-10-CM | POA: Diagnosis not present

## 2023-05-31 DIAGNOSIS — I2510 Atherosclerotic heart disease of native coronary artery without angina pectoris: Secondary | ICD-10-CM | POA: Diagnosis not present

## 2023-05-31 NOTE — Patient Instructions (Signed)
Medication Instructions:  No changes *If you need a refill on your cardiac medications before your next appointment, please call your pharmacy*   Lab Work: None ordered If you have labs (blood work) drawn today and your tests are completely normal, you will receive your results only by: MyChart Message (if you have MyChart) OR A paper copy in the mail If you have any lab test that is abnormal or we need to change your treatment, we will call you to review the results.   Testing/Procedures: Your provider has ordered a X-Ray of the wrist for you. You can have this done at the [facility] medical mall. You do not need an appointment. Please go to the entrance of the Medical Mall and check in at the front desk.  Follow-Up: At [facility], you and your health needs are our priority.  As part of our continuing mission to provide you with exceptional heart care, we have created designated Provider Care Teams.  These Care Teams include your primary Cardiologist (physician) and Advanced Practice Providers (APPs -  Physician Assistants and Nurse Practitioners) who all work together to provide you with the care you need, when you need it.  We recommend signing up for the patient portal called "MyChart".  Sign up information is provided on this After Visit Summary.  MyChart is used to connect with patients for Virtual Visits (Telemedicine).  Patients are able to view lab/test results, encounter notes, upcoming appointments, etc.  Non-urgent messages can be sent to your provider as well.   To learn more about what you can do with MyChart, go to [email or web address].    Your next appointment:   6 month(s)  Provider:   You may see Dr. Kirke Corin or one of the following Advanced Practice Providers on your designated Care Team:   Nicolasa Ducking, NP Eula Listen, PA-C Cadence Fransico Michael, PA-C Charlsie Quest, NP Carlos Levering, NP

## 2023-05-31 NOTE — Progress Notes (Signed)
Cardiology Office Note   Date:  05/31/2023   ID:  Tanya Roy, DOB 05-14-1929, MRN [id number]  PCP:  Kandyce Rud, MD  Cardiologist:   Lorine Bears, MD   Chief Complaint  Patient presents with   6 month follow up     Patient c/o a knot on left wrist that is painful. Medications reviewed by the patient's medication list from [facility].       History of Present Illness: Tanya Roy is a 89 y.o. female who presents for a follow-up visit regarding coronary artery disease and chronic systolic heart failure. She has known history of essential hypertension and hyperlipidemia. She was hospitalized in August 2022 with anterior ST elevation myocardial infarction.  Cardiac catheterization showed occluded mid LAD which was the culprit and was treated successfully with 2 drug-eluting stent placement.  She had significant obstructive disease involving the left circumflex and right coronary artery which were treated with staged PCI.  Echocardiogram showed an EF of 30 to 35%.  Subsequent repeat echocardiogram showed improved ejection fraction to 55%.  She continues to live in a assisted living facility.  Her memory continues to decline.  She had a fall in January.  She denies chest pain or shortness of breath.  She complains of swelling in the left wrist that started last week with mild discomfort.  She continues to lose weight with poor appetite.  Past Medical History:  Diagnosis Date   Abnormal mammogram of right breast 06/26/2010   Microcalcifications   Arthritis    Confusion 02/14/2021   start before STEMI acc. to sister Malachi Bonds   Osteoporosis     Past Surgical History:  Procedure Laterality Date   APPENDECTOMY     BREAST BIOPSY Right 06/27/2011   stereo, neg   CARDIAC CATHETERIZATION     CORONARY STENT INTERVENTION N/A 03/14/2021   Procedure: CORONARY STENT INTERVENTION;  Surgeon: Iran Ouch, MD;  Location: [facility] INVASIVE CV LAB;   Service: Cardiovascular;  Laterality: N/A;   CORONARY/GRAFT ACUTE MI REVASCULARIZATION N/A 02/14/2021   Procedure: Coronary/Graft Acute MI Revascularization;  Surgeon: Iran Ouch, MD;  Location: [facility] INVASIVE CV LAB;  Service: Cardiovascular;  Laterality: N/A;   FEMUR IM NAIL Right 08/29/2016   Procedure: INTRAMEDULLARY (IM) NAIL FEMORAL;  Surgeon: Kennedy Bucker, MD;  Location: [facility] ORS;  Service: Orthopedics;  Laterality: Right;   LEFT HEART CATH AND CORONARY ANGIOGRAPHY N/A 02/14/2021   Procedure: LEFT HEART CATH AND CORONARY ANGIOGRAPHY;  Surgeon: Iran Ouch, MD;  Location: [facility] INVASIVE CV LAB;  Service: Cardiovascular;  Laterality: N/A;   MELANOMA EXCISION Right 03/26/2015   Procedure: MELANOMA EXCISION;  Surgeon: Ricarda Frame, MD;  Location: [facility] ORS;  Service: General;  Laterality: Right;   ORIF WRIST FRACTURE Right 08/29/2016   Procedure: OPEN REDUCTION INTERNAL FIXATION (ORIF) WRIST FRACTURE;  Surgeon: Kennedy Bucker, MD;  Location: [facility] ORS;  Service: Orthopedics;  Laterality: Right;   SKIN BIOPSY  03/11/2015   Back     Current Outpatient Medications  Medication Sig Dispense Refill   aspirin EC 81 MG tablet Take 81 mg by mouth daily.     atorvastatin (LIPITOR) 20 MG tablet Take 20 mg by mouth daily.     carvedilol (COREG) 6.25 MG tablet TAKE 1 TABLET BY MOUTH 2 TIMES PER DAY 180 tablet 0   Cyanocobalamin (B-12) 1000 MCG CAPS Take 1 capsule by mouth daily.     empagliflozin (JARDIANCE) 10 MG TABS tablet TAKE 1 TABLET  BY MOUTH DAILY BEFORE BREAKFAST 90 tablet 3   ENTRESTO 24-26 MG TAKE 1 TABLE BY MOUTH 2 TIMES DAILY 180 tablet 3   Multiple Vitamin (MULTIVITAMIN) tablet Take 1 tablet by mouth daily.     nitroGLYCERIN (NITROSTAT) 0.4 MG SL tablet Place 1 tablet (0.4 mg total) under the tongue every 5 (five) minutes as needed for chest pain. 25 tablet 3   HYDROcodone-acetaminophen (NORCO/VICODIN) 5-325 MG tablet Take 1 tablet by mouth every 4 (four) hours as needed for  moderate pain. (Patient not taking: Reported on 05/31/2023) 20 tablet 0   Vitamin D, Ergocalciferol, (DRISDOL) 50000 units CAPS capsule Take 50,000 Units by mouth every 7 (seven) days. (Patient not taking: Reported on 05/31/2023)     No current facility-administered medications for this visit.    Allergies:   Patient has no known allergies.    Social History:  The patient  reports that she has never smoked. She has never used smokeless tobacco. She reports that she does not drink alcohol and does not use drugs.   Family History:  The patient's family history includes Heart disease in her father; Tuberculosis in her mother.    ROS:  Please see the history of present illness.   Otherwise, review of systems are positive for none.   All other systems are reviewed and negative.    PHYSICAL EXAM: VS:  BP (!) 160/70 (BP Location: Left Arm, Patient Position: Sitting, Cuff Size: Normal)   Pulse (!) 56   Ht 5\' 5"  (1.651 m)   Wt 112 lb (50.8 kg)   SpO2 98%   BMI 18.64 kg/m  , BMI Body mass index is 18.64 kg/m. GEN: Well nourished, well developed, in no acute distress  HEENT: normal  Neck: no JVD, carotid bruits, or masses Cardiac: RRR; no murmurs, rubs, or gallops, mild bilateral leg edema. Respiratory:  clear to auscultation bilaterally, normal work of breathing GI: soft, nontender, nondistended, + BS MS: no deformity or atrophy  Skin: warm and dry, no rash Neuro:  Strength and sensation are intact Psych: euthymic mood, full affect There is swelling in the left wrist with no clear deformity in the bone.  Radial pulses normal.  Mild ecchymosis.   EKG:  EKG is ordered today. The ekg ordered today demonstrates: Sinus bradycardia Possible Left atrial enlargement Rightward axis Septal infarct (cited on or before 15-Mar-2021) When compared with ECG of 15-Mar-2021 10:21, T wave inversion less evident in Anterolateral leads QT has shortened    Recent Labs: No results found for  requested labs within last 365 days.    Lipid Panel No results found for: "CHOL", "TRIG", "HDL", "CHOLHDL", "VLDL", "LDLCALC", "LDLDIRECT"    Wt Readings from Last 3 Encounters:  05/31/23 112 lb (50.8 kg)  12/18/22 122 lb (55.3 kg)  11/16/22 123 lb 8 oz (56 kg)          No data to display            ASSESSMENT AND PLAN:  1.  Coronary artery disease involving native coronary arteries without angina: She is doing extremely well with no anginal symptoms.  Continue low-dose aspirin indefinitely.  2.  Chronic systolic heart failure due to ischemic cardiomyopathy with subsequent improvement in ejection fraction to normal.  Continue carvedilol, Jardiance and Entresto.    3.  Essential hypertension: Blood pressure is elevated but usually her blood pressure is more controlled.  I asked her daughter to record blood pressure few times a week and let us know of  the trend.  If blood pressure is truly elevated, we will plan on increasing Entresto.  4.  Hyperlipidemia: I reviewed most recent labs done last month which showed an LDL of 27.  Continue atorvastatin.  5.  Left wrist swelling and pain: I requested an x-ray to ensure no fracture.  The patient does not remember recent fall.   Disposition:   FU with me in 6 months  Signed,  Lorine Bears, MD  05/31/2023 10:20 AM    [facility]

## 2023-06-14 ENCOUNTER — Emergency Department: Payer: Medicare Other

## 2023-06-14 ENCOUNTER — Emergency Department
Admission: EM | Admit: 2023-06-14 | Discharge: 2023-06-14 | Disposition: A | Discharge status: Home / Self Care | Payer: Medicare Other | Attending: Emergency Medicine | Admitting: Emergency Medicine

## 2023-06-14 ENCOUNTER — Encounter: Payer: Self-pay | Admitting: Emergency Medicine

## 2023-06-14 ENCOUNTER — Other Ambulatory Visit: Payer: Self-pay

## 2023-06-14 DIAGNOSIS — R600 Localized edema: Secondary | ICD-10-CM | POA: Insufficient documentation

## 2023-06-14 DIAGNOSIS — I110 Hypertensive heart disease with heart failure: Secondary | ICD-10-CM | POA: Diagnosis not present

## 2023-06-14 DIAGNOSIS — I509 Heart failure, unspecified: Secondary | ICD-10-CM | POA: Insufficient documentation

## 2023-06-14 DIAGNOSIS — M25432 Effusion, left wrist: Secondary | ICD-10-CM

## 2023-06-14 NOTE — ED Notes (Signed)
See triage note  Presents with some swelling to left wrist/arm area    This started about 1.5 weeks ago  Denies any new injury  Good pulses

## 2023-06-14 NOTE — ED Provider Notes (Signed)
[facility] Provider Note    Event Date/Time   First MD Initiated Contact with Patient 06/14/23 1238     (approximate)   History   Joint Swelling   HPI  Tanya Roy is a 89 y.o. female history of osteoporosis, hypertension, hyperlipidemia, heart failure presents emergency department for swelling to the left wrist.  Patient had a swelling noted, stays at home place, had an x-ray at home place and 1 other on 05/31/2023.  Daughter states that she continues to have swelling.  They are concerned and home placed thought maybe she needed a MRI.  States swelling has moved further up the arm      Physical Exam   Triage Vital Signs: ED Triage Vitals  Encounter Vitals Group     BP 06/14/23 1203 (!) 186/70     Systolic BP Percentile --      Diastolic BP Percentile --      Pulse Rate 06/14/23 1203 64     Resp 06/14/23 1203 18     Temp 06/14/23 1203 98 F (36.7 C)     Temp Source 06/14/23 1203 Oral     SpO2 06/14/23 1203 99 %     Weight 06/14/23 1204 122 lb (55.3 kg)     Height 06/14/23 1204 5\' 6"  (1.676 m)     Head Circumference --      Peak Flow --      Pain Score 06/14/23 1204 2     Pain Loc --      Pain Education --      Exclude from Growth Chart --     Most recent vital signs: Vitals:   06/14/23 1203  BP: (!) 186/70  Pulse: 64  Resp: 18  Temp: 98 F (36.7 C)  SpO2: 99%     General: Awake, no distress.   CV:  Good peripheral perfusion. regular rate and  rhythm Resp:  Normal effort.  Abd:  No distention.   Other:  Large amount of swelling noted at the distal radius and ulna, no bruising noted, no pulsatile mass, area seems to be firm upon palpation, neurovascular intact   ED Results / Procedures / Treatments   Labs (all labs ordered are listed, but only abnormal results are displayed) Labs Reviewed - No data to display   EKG     RADIOLOGY Ultrasound left upper extremity, x-ray left  wrist    PROCEDURES:   Procedures   MEDICATIONS ORDERED IN ED: Medications - No data to display   IMPRESSION / MDM / ASSESSMENT AND PLAN / ED COURSE  I reviewed the triage vital signs and the nursing notes.                              Differential diagnosis includes, but is not limited to, occult fracture, hematoma, DVT, cyst  Patient's presentation is most consistent with acute illness / injury with system symptoms.   X-ray left wrist, ultrasound left upper extremity   X-ray of the left wrist is negative for fracture, does show soft tissue swelling, confirmed by radiology, send independently reviewed interpreted by me  ReSound left upper extremity was independently reviewed interpreted by me by reading radiologist report as being negative for DVT.  Does comment to be bursitis, ganglion cyst or abscess.  Abscess is not likely as the area is not warm to the touch.  Does not appear to have any infection.  I  did explain all findings to the patient and her daughter.  Did place her in a splint.  She is to follow-up with the hand and wrist specialist at [facility].  Return emergency department worsening.  They are in agreement treatment plan.  She was discharged stable condition.   FINAL CLINICAL IMPRESSION(S) / ED DIAGNOSES   Final diagnoses:  Localized edema  Wrist swelling, left     Rx / DC Orders   ED Discharge Orders     None        Note:  This document was prepared using Dragon voice recognition software and may include unintentional dictation errors.    Faythe Ghee, PA-C 06/14/23 1517    Corena Herter, MD 06/14/23 1736

## 2023-06-14 NOTE — ED Triage Notes (Signed)
Patient to ED via POV for left wrist swelling. States ongoing x1.5 weeks. Had x-ray that were negative. Hx of old injury in wrist but no new injury. Denies pain when moving arm.

## 2023-08-23 ENCOUNTER — Other Ambulatory Visit: Payer: Self-pay

## 2023-08-23 ENCOUNTER — Emergency Department: Payer: Medicare Other

## 2023-08-23 ENCOUNTER — Emergency Department
Admission: EM | Admit: 2023-08-23 | Discharge: 2023-08-23 | Disposition: A | Discharge status: Home / Self Care | Payer: Medicare Other | Attending: Emergency Medicine | Admitting: Emergency Medicine

## 2023-08-23 DIAGNOSIS — I509 Heart failure, unspecified: Secondary | ICD-10-CM | POA: Diagnosis not present

## 2023-08-23 DIAGNOSIS — J101 Influenza due to other identified influenza virus with other respiratory manifestations: Secondary | ICD-10-CM | POA: Insufficient documentation

## 2023-08-23 DIAGNOSIS — I2510 Atherosclerotic heart disease of native coronary artery without angina pectoris: Secondary | ICD-10-CM | POA: Diagnosis not present

## 2023-08-23 DIAGNOSIS — R531 Weakness: Secondary | ICD-10-CM

## 2023-08-23 LAB — CBC WITH DIFFERENTIAL/PLATELET
Abs Immature Granulocytes: 0.03 10*3/uL (ref 0.00–0.07)
Basophils Absolute: 0 10*3/uL (ref 0.0–0.1)
Basophils Relative: 0 %
Eosinophils Absolute: 0 10*3/uL (ref 0.0–0.5)
Eosinophils Relative: 0 %
HCT: 44.3 % (ref 36.0–46.0)
Hemoglobin: 15.4 g/dL — ABNORMAL HIGH (ref 12.0–15.0)
Immature Granulocytes: 1 %
Lymphocytes Relative: 13 %
Lymphs Abs: 0.6 10*3/uL — ABNORMAL LOW (ref 0.7–4.0)
MCH: 33.3 pg (ref 26.0–34.0)
MCHC: 34.8 g/dL (ref 30.0–36.0)
MCV: 95.9 fL (ref 80.0–100.0)
Monocytes Absolute: 0.5 10*3/uL (ref 0.1–1.0)
Monocytes Relative: 9 %
Neutro Abs: 3.7 10*3/uL (ref 1.7–7.7)
Neutrophils Relative %: 77 %
Platelets: 118 10*3/uL — ABNORMAL LOW (ref 150–400)
RBC: 4.62 MIL/uL (ref 3.87–5.11)
RDW: 13.2 % (ref 11.5–15.5)
Smear Review: NORMAL
WBC Morphology: REACTIVE
WBC: 4.8 10*3/uL (ref 4.0–10.5)
nRBC: 0 % (ref 0.0–0.2)

## 2023-08-23 LAB — COMPREHENSIVE METABOLIC PANEL
ALT: 25 U/L (ref 0–44)
AST: 42 U/L — ABNORMAL HIGH (ref 15–41)
Albumin: 2.8 g/dL — ABNORMAL LOW (ref 3.5–5.0)
Alkaline Phosphatase: 63 U/L (ref 38–126)
Anion gap: 10 (ref 5–15)
BUN: 20 mg/dL (ref 8–23)
CO2: 21 mmol/L — ABNORMAL LOW (ref 22–32)
Calcium: 8.3 mg/dL — ABNORMAL LOW (ref 8.9–10.3)
Chloride: 100 mmol/L (ref 98–111)
Creatinine, Ser: 0.75 mg/dL (ref 0.44–1.00)
GFR, Estimated: >60 mL/min (ref >60)
Glucose, Bld: 142 mg/dL — ABNORMAL HIGH (ref 70–99)
Potassium: 3.4 mmol/L — ABNORMAL LOW (ref 3.5–5.1)
Sodium: 131 mmol/L — ABNORMAL LOW (ref 135–145)
Total Bilirubin: 0.4 mg/dL (ref 0.0–1.2)
Total Protein: 5.9 g/dL — ABNORMAL LOW (ref 6.5–8.1)

## 2023-08-23 LAB — URINALYSIS, W/ REFLEX TO CULTURE (INFECTION SUSPECTED)
Bilirubin Urine: NEGATIVE
Glucose, UA: >=500 mg/dL — AB
Hgb urine dipstick: NEGATIVE
Ketones, ur: NEGATIVE mg/dL
Leukocytes,Ua: NEGATIVE
Nitrite: NEGATIVE
Protein, ur: NEGATIVE mg/dL
Specific Gravity, Urine: 1.023 (ref 1.005–1.030)
pH: 6 (ref 5.0–8.0)

## 2023-08-23 LAB — RESP PANEL BY RT-PCR (RSV, FLU A&B, COVID)  RVPGX2
Influenza A by PCR: POSITIVE — AB
Influenza B by PCR: NEGATIVE
Resp Syncytial Virus by PCR: NEGATIVE
SARS Coronavirus 2 by RT PCR: NEGATIVE

## 2023-08-23 LAB — BRAIN NATRIURETIC PEPTIDE: B Natriuretic Peptide: 362.2 pg/mL — ABNORMAL HIGH (ref 0.0–100.0)

## 2023-08-23 LAB — PATHOLOGIST SMEAR REVIEW

## 2023-08-23 MED ORDER — SODIUM CHLORIDE 0.9 % IV BOLUS
500.0000 mL | Freq: Once | INTRAVENOUS | Status: AC
  Administered 2023-08-23: 500 mL via INTRAVENOUS

## 2023-08-23 NOTE — ED Triage Notes (Signed)
 Pt arrives via [facility] from [facility] with c/o of cough and weakness per 1/wk. Pt started an abx last night, per EMS no complaints. Pt is A&Ox4 during triage.

## 2023-08-23 NOTE — ED Notes (Signed)
 Pt to xray

## 2023-08-23 NOTE — ED Notes (Signed)
 Pt ambulated to restroom with assistance from staff. Pt returned to the bed, was reconnected to VS machine. Bed is in the lowest, locked position with call bell in reach. Pt tolerated activity well.

## 2023-08-23 NOTE — Discharge Instructions (Signed)
 You are seen in the ER today for evaluation of your cough and weakness.  Your flu test returned positive.  Your blood work was otherwise overall reassuring.  Your oxygen levels here are reassuring including when walking around.  I do been continuing taking your antibiotics as directed.  Review your mother medications with your primary care team.  Return to the ER for new or worsening symptoms.

## 2023-08-23 NOTE — ED Provider Notes (Signed)
 [facility] Provider Note    Event Date/Time   First MD Initiated Contact with Patient 08/23/23 [phone]     (approximate)   History   Cough and Weakness   HPI  Tanya Roy is a 89 year old female with history of CAD, CHF presenting to the ER for evaluation of cough and weakness. Patient tells me she has had a cough, but is unsure how long it has been present for. Denies fevers, chest pain, shortness of breath, vomiting, abdominal pain, diarrhea, dysuria. Is able to tell me that she is in the hospital, but unable to tell me further details.  Additional history obtained from [facility], care coordinator at patient's facility.  She reports that she was diagnosed with pneumonia yesterday.  Did have some borderline low oxygen saturations in the facility from 88% to the 90s.  With her history of CHF, they were concerned about a more serious infection.  They also note the patient has had some mild confusion from baseline.  They also note that residents at the facility have had the flu recently.    Physical Exam   Triage Vital Signs: ED Triage Vitals  Encounter Vitals Group     BP --      Systolic BP Percentile --      Diastolic BP Percentile --      Pulse Rate 08/23/23 0845 66     Resp 08/23/23 0845 17     Temp --      Temp src --      SpO2 08/23/23 0843 94 %     Weight --      Height --      Head Circumference --      Peak Flow --      Pain Score 08/23/23 0844 0     Pain Loc --      Pain Education --      Exclude from Growth Chart --     Most recent vital signs: Vitals:   08/23/23 0900 08/23/23 0930  BP: 114/61 (!) 116/58  Pulse: (!) 59 (!) 55  Resp: 16 (!) 23  Temp:    SpO2: 99% 96%     General: Awake, interactive  CV:  Regular rate, good peripheral perfusion.  Resp:  Unlabored respirations, lungs are to auscultation, satting in the upper 90s on room air Abd:  Nondistended, soft, nontender to palpation Neuro:  Symmetric facial  movement, fluid speech   ED Results / Procedures / Treatments   Labs (all labs ordered are listed, but only abnormal results are displayed) Labs Reviewed  RESP PANEL BY RT-PCR (RSV, FLU A&B, COVID)  RVPGX2 - Abnormal; Notable for the following components:      Result Value   Influenza A by PCR POSITIVE (*)    All other components within normal limits  CBC WITH DIFFERENTIAL/PLATELET - Abnormal; Notable for the following components:   Hemoglobin 15.4 (*)    Platelets 118 (*)    Lymphs Abs 0.6 (*)    All other components within normal limits  COMPREHENSIVE METABOLIC PANEL - Abnormal; Notable for the following components:   Sodium 131 (*)    Potassium 3.4 (*)    CO2 21 (*)    Glucose, Bld 142 (*)    Calcium 8.3 (*)    Total Protein 5.9 (*)    Albumin 2.8 (*)    AST 42 (*)    All other components within normal limits  URINALYSIS, W/ REFLEX TO CULTURE (INFECTION  SUSPECTED) - Abnormal; Notable for the following components:   Color, Urine YELLOW (*)    APPearance HAZY (*)    Glucose, UA >=500 (*)    Bacteria, UA RARE (*)    All other components within normal limits  BRAIN NATRIURETIC PEPTIDE - Abnormal; Notable for the following components:   B Natriuretic Peptide 362.2 (*)    All other components within normal limits  PATHOLOGIST SMEAR REVIEW     EKG EKG independently reviewed interpreted by myself (ER attending) demonstrates:  EKG demonstrates sinus rhythm at a rate of 85, PR 153, QRS 86, QTc 454, no acute ST changes  RADIOLOGY Imaging independently reviewed and interpreted by myself demonstrates:  CXR without obvious focal consolidation, radiology notes that a left lung base infection cannot be fully excluded CT head without acute bleed  PROCEDURES:  Critical Care performed: No  Procedures   MEDICATIONS ORDERED IN ED: Medications  sodium chloride 0.9 % bolus 500 mL (0 mLs Intravenous Stopped 08/23/23 1046)     IMPRESSION / MDM / ASSESSMENT AND PLAN / ED COURSE   I reviewed the triage vital signs and the nursing notes.  Differential diagnosis includes, but is not limited to, pneumonia, pneumothorax, viral illness, CHF exacerbation  Patient's presentation is most consistent with acute presentation with potential threat to life or bodily function.  89 year old female presenting with cough.  Stable vitals on presentation.  Clinically does not look volume overloaded, suspect slightly dry in the setting of recent illness.  Daughter also reports some recent diarrhea.  Will provide small fluid bolus.  Will obtain labs, EKG, x-Arley Salamone to further evaluate.  Disposition pending workup.  11:49 AM Labs without critical derangement.  Urine overall not suggestive of infection and patient without acute urinary symptoms.  BNP slightly elevated, but clinically without significant volume overload.  Flu test positive.  Suspect this is the etiology of patient's symptoms.  CXR without obvious focal pneumonia, but possible area of left base noted.  Head CT without acute intracranial findings.  Mild sinus inflammation noted, patient already on antibiotics for pneumonia as an outpatient.  She was reevaluated.  She remains with sats in the upper 90s.  She was able to ambulate to the bathroom and back without desaturations.  Patient and family were updated on the results of her workup.  I did discuss possible admission given her age and confusion.  However, daughter reports that patient actually appears alert and close to her baseline currently.  They would like to be discharged home after discussion of risks and benefits which I do think is reasonable.  Strict return precautions were provided.  I did recommend she continue her outpatient antibiotics and reviewing her medications with nurse practitioner at our facility.  Outside window for Tamiflu.  Patient discharged in stable condition.    FINAL CLINICAL IMPRESSION(S) / ED DIAGNOSES   Final diagnoses:  Generalized weakness  Influenza  A     Rx / DC Orders   ED Discharge Orders     None        Note:  This document was prepared using Dragon voice recognition software and may include unintentional dictation errors.   Trinna Post, MD 08/23/23 1149

## 2023-12-03 ENCOUNTER — Other Ambulatory Visit (INDEPENDENT_AMBULATORY_CARE_PROVIDER_SITE_OTHER): Payer: Self-pay | Admitting: Vascular Surgery

## 2023-12-03 DIAGNOSIS — I739 Peripheral vascular disease, unspecified: Secondary | ICD-10-CM

## 2023-12-03 DIAGNOSIS — R0989 Other specified symptoms and signs involving the circulatory and respiratory systems: Secondary | ICD-10-CM

## 2023-12-03 DIAGNOSIS — R209 Unspecified disturbances of skin sensation: Secondary | ICD-10-CM

## 2023-12-04 ENCOUNTER — Encounter (INDEPENDENT_AMBULATORY_CARE_PROVIDER_SITE_OTHER): Payer: Self-pay | Admitting: Nurse Practitioner

## 2023-12-04 ENCOUNTER — Encounter (INDEPENDENT_AMBULATORY_CARE_PROVIDER_SITE_OTHER): Payer: Self-pay

## 2024-06-06 ENCOUNTER — Encounter: Payer: Self-pay | Admitting: Emergency Medicine

## 2024-06-06 ENCOUNTER — Emergency Department

## 2024-06-06 ENCOUNTER — Other Ambulatory Visit: Payer: Self-pay

## 2024-06-06 ENCOUNTER — Emergency Department
Admission: EM | Admit: 2024-06-06 | Discharge: 2024-06-07 | Disposition: A | Attending: Emergency Medicine | Admitting: Emergency Medicine

## 2024-06-06 DIAGNOSIS — I2510 Atherosclerotic heart disease of native coronary artery without angina pectoris: Secondary | ICD-10-CM | POA: Insufficient documentation

## 2024-06-06 DIAGNOSIS — S72401A Unspecified fracture of lower end of right femur, initial encounter for closed fracture: Secondary | ICD-10-CM | POA: Insufficient documentation

## 2024-06-06 DIAGNOSIS — W19XXXA Unspecified fall, initial encounter: Secondary | ICD-10-CM | POA: Insufficient documentation

## 2024-06-06 DIAGNOSIS — I509 Heart failure, unspecified: Secondary | ICD-10-CM | POA: Insufficient documentation

## 2024-06-06 DIAGNOSIS — S0101XA Laceration without foreign body of scalp, initial encounter: Secondary | ICD-10-CM | POA: Insufficient documentation

## 2024-06-06 DIAGNOSIS — Y92129 Unspecified place in nursing home as the place of occurrence of the external cause: Secondary | ICD-10-CM | POA: Insufficient documentation

## 2024-06-06 DIAGNOSIS — I110 Hypertensive heart disease with heart failure: Secondary | ICD-10-CM | POA: Insufficient documentation

## 2024-06-06 DIAGNOSIS — S72409S Unspecified fracture of lower end of unspecified femur, sequela: Secondary | ICD-10-CM

## 2024-06-06 LAB — CBC WITH DIFFERENTIAL/PLATELET
Abs Immature Granulocytes: 0.05 K/uL (ref 0.00–0.07)
Basophils Absolute: 0.1 K/uL (ref 0.0–0.1)
Basophils Relative: 0 %
Eosinophils Absolute: 0.3 K/uL (ref 0.0–0.5)
Eosinophils Relative: 2 %
HCT: 34.9 % — ABNORMAL LOW (ref 36.0–46.0)
Hemoglobin: 11.7 g/dL — ABNORMAL LOW (ref 12.0–15.0)
Immature Granulocytes: 0 %
Lymphocytes Relative: 12 %
Lymphs Abs: 1.3 K/uL (ref 0.7–4.0)
MCH: 33.6 pg (ref 26.0–34.0)
MCHC: 33.5 g/dL (ref 30.0–36.0)
MCV: 100.3 fL — ABNORMAL HIGH (ref 80.0–100.0)
Monocytes Absolute: 0.8 K/uL (ref 0.1–1.0)
Monocytes Relative: 7 %
Neutro Abs: 8.8 K/uL — ABNORMAL HIGH (ref 1.7–7.7)
Neutrophils Relative %: 79 %
Platelets: 160 K/uL (ref 150–400)
RBC: 3.48 MIL/uL — ABNORMAL LOW (ref 3.87–5.11)
RDW: 13.3 % (ref 11.5–15.5)
WBC: 11.3 K/uL — ABNORMAL HIGH (ref 4.0–10.5)
nRBC: 0 % (ref 0.0–0.2)

## 2024-06-06 LAB — BASIC METABOLIC PANEL WITH GFR
Anion gap: 7 (ref 5–15)
BUN: 29 mg/dL — ABNORMAL HIGH (ref 8–23)
CO2: 28 mmol/L (ref 22–32)
Calcium: 9 mg/dL (ref 8.9–10.3)
Chloride: 106 mmol/L (ref 98–111)
Creatinine, Ser: 0.84 mg/dL (ref 0.44–1.00)
GFR, Estimated: >60 mL/min (ref >60)
Glucose, Bld: 130 mg/dL — ABNORMAL HIGH (ref 70–99)
Potassium: 4.2 mmol/L (ref 3.5–5.1)
Sodium: 140 mmol/L (ref 135–145)

## 2024-06-06 MED ORDER — DIAZEPAM 5 MG/ML IJ SOLN
2.5000 mg | Freq: Once | INTRAMUSCULAR | Status: AC
  Administered 2024-06-07: 2.5 mg via INTRAVENOUS
  Filled 2024-06-06: qty 2

## 2024-06-06 NOTE — ED Triage Notes (Signed)
 Pt arrives w/ EMS from [facility] [place] due to mechanical fall today. Has skin tears to forehead, rt hand, rt elbow.  75mcg fentanyl  given en route  Shortening  & rotation rt leg. Pain in rt femur.  A&O x 4

## 2024-06-06 NOTE — ED Notes (Signed)
 Pt transported to CT ?

## 2024-06-06 NOTE — Consult Note (Incomplete)
 Orthopedic Surgery Consult Note  Assessment: Patient is a 89 y.o. female with right periprosthetic femur fracture   Plan: -Operative plans: planning for operative fixation today -Diet: NPO for procedure -DVT ppx: aspirin  81mg  BID post-operatively -Ancef  and TXA on call to OR -Weight bearing status: NWB RLE -PT evaluate and treat post-op -Pain control -Dispo: pending completion of operative plans   Discussed recommendation for operative intervention in the form of right femur fracture open reduction internal fixation with cephalomedullary rod. Explained that I would need to remove the prior rod first in order to effectively do this surgery. Recommended retrograde rod to allow for immediate weight bearing.. Explained the risks of this procedure included, but were not limited to: nonunion, malunion, hardware failure, infection, bleeding, stiffness, neurovascular injury, knee pain, need for additional procedures, deep vein thrombosis, pulmonary embolism, and death. The benefits of this procedure would be to promote fracture healing by providing stability and to allow for immediate weight bearing. The alternatives of this surgery would be to treat the fracture with immobilization in traction or to do no intervention. The patient's questions were answered to her satisfaction. After this discussion, patient elected to proceed with surgery. Informed consent was obtained.   ___________________________________________________________________________   Reason for consult: right femur fracture  History:  Patient is a 89 y.o. female with history of prior intertrochanteric femur fracture that underwent cephlomedullary rodding in 2018 who had a ground level fall at her nursing home yesterday. Presented to [facility]. Orthopedics was consulted there and felt that this was out of their scope of practice so requested transfer to [facility]. She was admitted to medicine overnight. Reporting right knee and thigh pain. No  pain elsewhere.    Physical Exam:  General: no acute distress, appears stated age Neurologic: alert, answering questions appropriately, following commands Cardiovascular: regular rate, no cyanosis Respiratory: unlabored breathing on room air, symmetric chest rise Psychiatric: appropriate affect, normal cadence to speech  MSK:   -Bilateral upper extremities  No tenderness to palpation over extremity, no gross deformity, no open wounds Fires deltoid, biceps, triceps, wrist extensors, wrist flexors, finger extensors, finger flexors  AIN/PIN/IO intact  Palpable radial pulse  Sensation intact to light touch in median/ulnar/radial/axillary nerve distributions  Hand warm and well perfused  -Left lower extremity  No tenderness to palpation over extremity, no gross deformity, no open wounds Fires hip flexors, quadriceps, hamstrings, tibialis anterior, gastrocnemius and soleus, extensor hallucis longus Plantarflexes and dorsiflexes toes Sensation intact to light touch in sural, saphenous, tibial, deep peroneal, and superficial peroneal nerve distributions Foot warm and well perfused  -Right lower extremities  TTP at the knee and distal thigh, no other tenderness to palpation over the extremity, gross deformity at the distal thigh, pain with log roll in the thigh, no open wounds seen Does not fire hip flexors, quadriceps, hamstrings due to pain. Fires tibialis anterior, gastrocnemius and soleus, extensor hallucis longus Plantarflexes and dorsiflexes toes Sensation intact to light touch in sural, saphenous, tibial, deep peroneal, and superficial peroneal nerve distributions Foot warm and well perfused  Imaging: XRs of the right femur from 06/06/2024 were independently reviewed and interpreted, showing a apex anterior spiral distal femur fracture. The femur fracture is around a prior cephalomedullary rod. Distal to the rod there does not appear to be any extension of the fracture. Prior  proximal femur fracture appears well healed.    Patient name: Tanya Roy Patient MRN: [id number] Date: .06/07/2024

## 2024-06-06 NOTE — ED Provider Notes (Signed)
 [facility] Provider Note    Event Date/Time   First MD Initiated Contact with Patient 06/06/24 2012     (approximate)   History   Chief Complaint Fall   HPI  Artha Stavros is a 89 y.o. female with past medical history of hypertension, hyperlipidemia, CAD, and CHF who presents to the ED complaining of fall.  Patient had a witnessed mechanical fall at her nursing facility just prior to arrival, striking her head as well as her right leg.  She denies losing consciousness, states she does not take a blood thinner beyond a daily baby aspirin .  She does report significant pain in her right upper leg, was given 75 mcg of fentanyl  by EMS prior to arrival.  She denies significant headache, neck pain, upper extremity pain, chest pain, or abdominal pain.  EMS notes shortening and external rotation of the right leg.     Physical Exam   Triage Vital Signs: ED Triage Vitals  Encounter Vitals Group     BP 06/06/24 2011 (!) 191/95     Girls Systolic BP Percentile --      Girls Diastolic BP Percentile --      Boys Systolic BP Percentile --      Boys Diastolic BP Percentile --      Pulse Rate 06/06/24 2011 (!) 59     Resp 06/06/24 2011 12     Temp 06/06/24 2011 98 F (36.7 C)     Temp Source 06/06/24 2011 Oral     SpO2 06/06/24 2011 99 %     Weight --      Height --      Head Circumference --      Peak Flow --      Pain Score 06/06/24 2012 2     Pain Loc --      Pain Education --      Exclude from Growth Chart --     Most recent vital signs: Vitals:   06/06/24 2011 06/06/24 2100  BP: (!) 191/95 (!) 150/69  Pulse: (!) 59 62  Resp: 12 11  Temp: 98 F (36.7 C)   SpO2: 99% 99%    Constitutional: Alert and oriented. Eyes: Conjunctivae are normal. Head: Less than 1 cm laceration to the right temporal scalp, no active bleeding. Nose: No congestion/rhinnorhea. Mouth/Throat: Mucous membranes are moist.  Neck: No midline cervical spine  tenderness to palpation. Cardiovascular: Normal rate, regular rhythm. Grossly normal heart sounds.  2+ radial and DP pulses bilaterally. Respiratory: Normal respiratory effort.  No retractions. Lungs CTAB.  No chest wall tenderness to palpation noted. Gastrointestinal: Soft and nontender. No distention. Musculoskeletal: Diffuse tenderness to palpation of right hip and knee with shortening of the extremity.  No upper extremity bony tenderness to palpation. Neurologic:  Normal speech and language. No gross focal neurologic deficits are appreciated.    ED Results / Procedures / Treatments   Labs (all labs ordered are listed, but only abnormal results are displayed) Labs Reviewed  CBC WITH DIFFERENTIAL/PLATELET - Abnormal; Notable for the following components:      Result Value   WBC 11.3 (*)    RBC 3.48 (*)    Hemoglobin 11.7 (*)    HCT 34.9 (*)    MCV 100.3 (*)    Neutro Abs 8.8 (*)    All other components within normal limits  BASIC METABOLIC PANEL WITH GFR - Abnormal; Notable for the following components:   Glucose, Bld 130 (*)  BUN 29 (*)    All other components within normal limits  TYPE AND SCREEN     EKG  ED ECG REPORT I, Carlin Palin, the attending physician, personally viewed and interpreted this ECG.   Date: 06/06/2024  EKG Time: 21:50  Rate: 56  Rhythm: sinus bradycardia  Axis: Normal  Intervals:none  ST&T Change: None  RADIOLOGY CT head reviewed and interpreted by me with no hemorrhage or midline shift.  PROCEDURES:  Critical Care performed: No  Procedures   MEDICATIONS ORDERED IN ED: Medications  diazepam (VALIUM) injection 2.5 mg (has no administration in time range)     IMPRESSION / MDM / ASSESSMENT AND PLAN / ED COURSE  I reviewed the triage vital signs and the nursing notes.                              89 y.o. female with past medical history of hypertension, hyperlipidemia, CAD, and CHF who presents to the ED complaining of fall  just prior to arrival striking her head and right leg.  Patient's presentation is most consistent with acute presentation with potential threat to life or bodily function.  Differential diagnosis includes, but is not limited to, intracranial injury, cervical spine injury, fracture, dislocation, contusion.  Patient nontoxic-appearing and in no acute distress, vital signs are unremarkable.  We will check CT head and cervical spine, also check x-rays of the right hip and knee.  Patient declines additional pain medication and is at her baseline mental status.  CT head and cervical spine are negative for acute process, x-rays of the left hip and knee show periprosthetic fracture of the distal femur around her previous intramedullary nail for intertrochanteric fracture.  Case discussed with Dr. Tobie of orthopedics, who recommends transfer to a trauma center.  Case discussed with Dr. Georgina of orthopedics at [facility], who is agreeable to transfer and request admission to the hospitalist service at [facility].      FINAL CLINICAL IMPRESSION(S) / ED DIAGNOSES   Final diagnoses:  Closed fracture of distal end of femur, unspecified fracture morphology, sequela  Fall, initial encounter     Rx / DC Orders   ED Discharge Orders     None        Note:  This document was prepared using Dragon voice recognition software and may include unintentional dictation errors.   Palin Carlin, MD 06/06/24 [phone]

## 2024-06-07 ENCOUNTER — Inpatient Hospital Stay: Admit: 2024-06-07 | Admitting: Internal Medicine

## 2024-06-07 ENCOUNTER — Inpatient Hospital Stay (HOSPITAL_COMMUNITY)
Admit: 2024-06-07 | Discharge: 2024-06-11 | DRG: 480 | Disposition: A | Discharge status: Skilled Nursing Facility | Attending: Internal Medicine | Admitting: Internal Medicine

## 2024-06-07 ENCOUNTER — Inpatient Hospital Stay (HOSPITAL_COMMUNITY)

## 2024-06-07 ENCOUNTER — Inpatient Hospital Stay (HOSPITAL_COMMUNITY): Admitting: Anesthesiology

## 2024-06-07 ENCOUNTER — Encounter (HOSPITAL_COMMUNITY): Payer: Self-pay

## 2024-06-07 ENCOUNTER — Encounter (HOSPITAL_COMMUNITY): Payer: Self-pay | Admitting: Orthopedic Surgery

## 2024-06-07 ENCOUNTER — Encounter: Disposition: A | Discharge status: Skilled Nursing Facility | Payer: Self-pay | Attending: Internal Medicine

## 2024-06-07 DIAGNOSIS — S7291XA Unspecified fracture of right femur, initial encounter for closed fracture: Principal | ICD-10-CM | POA: Diagnosis present

## 2024-06-07 DIAGNOSIS — M9711XA Periprosthetic fracture around internal prosthetic right knee joint, initial encounter: Secondary | ICD-10-CM | POA: Diagnosis present

## 2024-06-07 DIAGNOSIS — I2510 Atherosclerotic heart disease of native coronary artery without angina pectoris: Secondary | ICD-10-CM | POA: Diagnosis present

## 2024-06-07 DIAGNOSIS — M810 Age-related osteoporosis without current pathological fracture: Secondary | ICD-10-CM | POA: Diagnosis present

## 2024-06-07 DIAGNOSIS — R4182 Altered mental status, unspecified: Secondary | ICD-10-CM | POA: Diagnosis not present

## 2024-06-07 DIAGNOSIS — D72829 Elevated white blood cell count, unspecified: Secondary | ICD-10-CM | POA: Diagnosis present

## 2024-06-07 DIAGNOSIS — F0390 Unspecified dementia without behavioral disturbance: Secondary | ICD-10-CM | POA: Diagnosis present

## 2024-06-07 DIAGNOSIS — Z7401 Bed confinement status: Secondary | ICD-10-CM | POA: Diagnosis not present

## 2024-06-07 DIAGNOSIS — D62 Acute posthemorrhagic anemia: Secondary | ICD-10-CM | POA: Diagnosis present

## 2024-06-07 DIAGNOSIS — I5022 Chronic systolic (congestive) heart failure: Secondary | ICD-10-CM | POA: Diagnosis present

## 2024-06-07 DIAGNOSIS — J9601 Acute respiratory failure with hypoxia: Secondary | ICD-10-CM | POA: Diagnosis present

## 2024-06-07 DIAGNOSIS — Z9861 Coronary angioplasty status: Secondary | ICD-10-CM | POA: Diagnosis not present

## 2024-06-07 DIAGNOSIS — S72401A Unspecified fracture of lower end of right femur, initial encounter for closed fracture: Secondary | ICD-10-CM | POA: Diagnosis not present

## 2024-06-07 DIAGNOSIS — Z85820 Personal history of malignant melanoma of skin: Secondary | ICD-10-CM | POA: Diagnosis not present

## 2024-06-07 DIAGNOSIS — W1830XA Fall on same level, unspecified, initial encounter: Secondary | ICD-10-CM | POA: Diagnosis present

## 2024-06-07 DIAGNOSIS — I252 Old myocardial infarction: Secondary | ICD-10-CM | POA: Diagnosis not present

## 2024-06-07 DIAGNOSIS — I110 Hypertensive heart disease with heart failure: Secondary | ICD-10-CM | POA: Diagnosis present

## 2024-06-07 DIAGNOSIS — Y92129 Unspecified place in nursing home as the place of occurrence of the external cause: Secondary | ICD-10-CM | POA: Diagnosis not present

## 2024-06-07 DIAGNOSIS — Z831 Family history of other infectious and parasitic diseases: Secondary | ICD-10-CM | POA: Diagnosis not present

## 2024-06-07 DIAGNOSIS — Z8249 Family history of ischemic heart disease and other diseases of the circulatory system: Secondary | ICD-10-CM | POA: Diagnosis not present

## 2024-06-07 DIAGNOSIS — I5021 Acute systolic (congestive) heart failure: Secondary | ICD-10-CM | POA: Diagnosis not present

## 2024-06-07 DIAGNOSIS — S72341A Displaced spiral fracture of shaft of right femur, initial encounter for closed fracture: Secondary | ICD-10-CM | POA: Diagnosis present

## 2024-06-07 DIAGNOSIS — Z7984 Long term (current) use of oral hypoglycemic drugs: Secondary | ICD-10-CM | POA: Diagnosis not present

## 2024-06-07 DIAGNOSIS — Z7982 Long term (current) use of aspirin: Secondary | ICD-10-CM | POA: Diagnosis not present

## 2024-06-07 DIAGNOSIS — E785 Hyperlipidemia, unspecified: Secondary | ICD-10-CM | POA: Diagnosis present

## 2024-06-07 DIAGNOSIS — I959 Hypotension, unspecified: Secondary | ICD-10-CM | POA: Diagnosis not present

## 2024-06-07 DIAGNOSIS — S79191A Other physeal fracture of lower end of right femur, initial encounter for closed fracture: Secondary | ICD-10-CM | POA: Diagnosis not present

## 2024-06-07 DIAGNOSIS — S72341S Displaced spiral fracture of shaft of right femur, sequela: Secondary | ICD-10-CM | POA: Diagnosis not present

## 2024-06-07 DIAGNOSIS — Z66 Do not resuscitate: Secondary | ICD-10-CM | POA: Diagnosis present

## 2024-06-07 HISTORY — DX: Acute myocardial infarction, unspecified: I21.9

## 2024-06-07 LAB — TYPE AND SCREEN
ABO/RH(D): O POS
Antibody Screen: NEGATIVE

## 2024-06-07 MED ORDER — SUGAMMADEX SODIUM 200 MG/2ML IV SOLN
INTRAVENOUS | Status: DC | PRN
  Administered 2024-06-07: 100 mg via INTRAVENOUS

## 2024-06-07 MED ORDER — FENTANYL CITRATE (PF) 100 MCG/2ML IJ SOLN: 25.0000 ug | INTRAMUSCULAR | Status: DC | PRN

## 2024-06-07 MED ORDER — CARVEDILOL 6.25 MG PO TABS
6.2500 mg | ORAL_TABLET | Freq: Once | ORAL | Status: AC
  Administered 2024-06-07: 6.25 mg via ORAL

## 2024-06-07 MED ORDER — 0.9 % SODIUM CHLORIDE (POUR BTL) OPTIME
TOPICAL | Status: DC | PRN
  Administered 2024-06-07: 1000 mL

## 2024-06-07 MED ORDER — LACTATED RINGERS IV SOLN: INTRAVENOUS | Status: DC

## 2024-06-07 MED ORDER — CHLORHEXIDINE GLUCONATE 0.12 % MT SOLN
OROMUCOSAL | Status: AC
  Filled 2024-06-07: qty 15

## 2024-06-07 MED ORDER — CARVEDILOL 6.25 MG PO TABS: 6.2500 mg | ORAL_TABLET | Freq: Two times a day (BID) | ORAL | Status: DC

## 2024-06-07 MED ORDER — ONDANSETRON HCL 4 MG PO TABS: 4.0000 mg | ORAL_TABLET | Freq: Four times a day (QID) | ORAL | Status: DC | PRN

## 2024-06-07 MED ORDER — ONDANSETRON HCL 4 MG/2ML IJ SOLN
INTRAMUSCULAR | Status: AC
  Filled 2024-06-07: qty 2

## 2024-06-07 MED ORDER — LIDOCAINE 2% (20 MG/ML) 5 ML SYRINGE
INTRAMUSCULAR | Status: AC
  Filled 2024-06-07: qty 5

## 2024-06-07 MED ORDER — PROPOFOL 500 MG/50ML IV EMUL
INTRAVENOUS | Status: DC | PRN
  Administered 2024-06-07: 100 ug/kg/min via INTRAVENOUS

## 2024-06-07 MED ORDER — TRANEXAMIC ACID-NACL 1000-0.7 MG/100ML-% IV SOLN
1000.0000 mg | INTRAVENOUS | Status: AC
  Administered 2024-06-07: 1000 mg via INTRAVENOUS
  Filled 2024-06-07: qty 100

## 2024-06-07 MED ORDER — ALBUMIN HUMAN 5 % IV SOLN: INTRAVENOUS | Status: DC | PRN

## 2024-06-07 MED ORDER — GLYCOPYRROLATE PF 0.2 MG/ML IJ SOSY
PREFILLED_SYRINGE | INTRAMUSCULAR | Status: AC
  Filled 2024-06-07: qty 1

## 2024-06-07 MED ORDER — FENTANYL CITRATE (PF) 100 MCG/2ML IJ SOLN
INTRAMUSCULAR | Status: AC
  Filled 2024-06-07: qty 2

## 2024-06-07 MED ORDER — FENTANYL CITRATE (PF) 250 MCG/5ML IJ SOLN
INTRAMUSCULAR | Status: DC | PRN
  Administered 2024-06-07 (×4): 25 ug via INTRAVENOUS

## 2024-06-07 MED ORDER — VANCOMYCIN HCL 1000 MG IV SOLR
INTRAVENOUS | Status: AC
  Filled 2024-06-07: qty 20

## 2024-06-07 MED ORDER — CARVEDILOL 3.125 MG PO TABS
ORAL_TABLET | ORAL | Status: AC
  Filled 2024-06-07: qty 2

## 2024-06-07 MED ORDER — ALBUMIN HUMAN 5 % IV SOLN
INTRAVENOUS | Status: AC
  Filled 2024-06-07: qty 250

## 2024-06-07 MED ORDER — PHENYLEPHRINE 80 MCG/ML (10ML) SYRINGE FOR IV PUSH (FOR BLOOD PRESSURE SUPPORT)
PREFILLED_SYRINGE | INTRAVENOUS | Status: DC | PRN
  Administered 2024-06-07: 80 ug via INTRAVENOUS

## 2024-06-07 MED ORDER — HYDROMORPHONE HCL 1 MG/ML IJ SOLN
INTRAMUSCULAR | Status: DC | PRN
  Administered 2024-06-07: .5 mg via INTRAVENOUS

## 2024-06-07 MED ORDER — PHENYLEPHRINE 80 MCG/ML (10ML) SYRINGE FOR IV PUSH (FOR BLOOD PRESSURE SUPPORT)
PREFILLED_SYRINGE | INTRAVENOUS | Status: AC
  Filled 2024-06-07: qty 20

## 2024-06-07 MED ORDER — LIDOCAINE 2% (20 MG/ML) 5 ML SYRINGE
INTRAMUSCULAR | Status: DC | PRN
  Administered 2024-06-07: 40 mg via INTRAVENOUS

## 2024-06-07 MED ORDER — DROPERIDOL 2.5 MG/ML IJ SOLN: 0.6250 mg | Freq: Once | INTRAMUSCULAR | Status: DC | PRN

## 2024-06-07 MED ORDER — HYDROMORPHONE HCL 1 MG/ML IJ SOLN: 0.5000 mg | INTRAMUSCULAR | Status: DC | PRN

## 2024-06-07 MED ORDER — ROCURONIUM BROMIDE 10 MG/ML (PF) SYRINGE
PREFILLED_SYRINGE | INTRAVENOUS | Status: AC
  Filled 2024-06-07: qty 10

## 2024-06-07 MED ORDER — ACETAMINOPHEN 650 MG RE SUPP: 650.0000 mg | Freq: Four times a day (QID) | RECTAL | Status: DC | PRN

## 2024-06-07 MED ORDER — ORAL CARE MOUTH RINSE: 15.0000 mL | Freq: Once | OROMUCOSAL | Status: AC

## 2024-06-07 MED ORDER — VANCOMYCIN HCL 1000 MG IV SOLR
INTRAVENOUS | Status: DC | PRN
  Administered 2024-06-07: 1000 mg via TOPICAL

## 2024-06-07 MED ORDER — EPHEDRINE 5 MG/ML INJ
INTRAVENOUS | Status: AC
  Filled 2024-06-07: qty 10

## 2024-06-07 MED ORDER — PROPOFOL 10 MG/ML IV BOLUS
INTRAVENOUS | Status: DC | PRN
  Administered 2024-06-07: 50 mg via INTRAVENOUS

## 2024-06-07 MED ORDER — ONDANSETRON HCL 4 MG/2ML IJ SOLN: 4.0000 mg | Freq: Four times a day (QID) | INTRAMUSCULAR | Status: DC | PRN

## 2024-06-07 MED ORDER — EPHEDRINE SULFATE-NACL 50-0.9 MG/10ML-% IV SOSY
PREFILLED_SYRINGE | INTRAVENOUS | Status: DC | PRN
  Administered 2024-06-07: 7.5 mg via INTRAVENOUS
  Administered 2024-06-07 (×2): 5 mg via INTRAVENOUS
  Administered 2024-06-07: 7.5 mg via INTRAVENOUS

## 2024-06-07 MED ORDER — ACETAMINOPHEN 325 MG PO TABS: 650.0000 mg | ORAL_TABLET | Freq: Four times a day (QID) | ORAL | Status: DC | PRN

## 2024-06-07 MED ORDER — CHLORHEXIDINE GLUCONATE 0.12 % MT SOLN
15.0000 mL | Freq: Once | OROMUCOSAL | Status: AC
  Administered 2024-06-07: 15 mL via OROMUCOSAL

## 2024-06-07 MED ORDER — PHENYLEPHRINE HCL-NACL 20-0.9 MG/250ML-% IV SOLN
INTRAVENOUS | Status: DC | PRN
  Administered 2024-06-07: 25 ug/min via INTRAVENOUS
  Administered 2024-06-07: 15 ug/min via INTRAVENOUS

## 2024-06-07 MED ORDER — HYDROMORPHONE HCL 1 MG/ML IJ SOLN
INTRAMUSCULAR | Status: AC
  Filled 2024-06-07: qty 0.5

## 2024-06-07 MED ORDER — CEFAZOLIN SODIUM-DEXTROSE 2-4 GM/100ML-% IV SOLN
2.0000 g | INTRAVENOUS | Status: AC
  Administered 2024-06-07: 2 g via INTRAVENOUS
  Filled 2024-06-07: qty 100

## 2024-06-07 MED ORDER — GLYCOPYRROLATE PF 0.2 MG/ML IJ SOSY
PREFILLED_SYRINGE | INTRAMUSCULAR | Status: DC | PRN
  Administered 2024-06-07 (×2): .1 mg via INTRAVENOUS

## 2024-06-07 MED ORDER — ROCURONIUM BROMIDE 10 MG/ML (PF) SYRINGE
PREFILLED_SYRINGE | INTRAVENOUS | Status: DC | PRN
  Administered 2024-06-07: 10 mg via INTRAVENOUS
  Administered 2024-06-07: 50 mg via INTRAVENOUS

## 2024-06-07 MED ORDER — DEXAMETHASONE SOD PHOSPHATE PF 10 MG/ML IJ SOLN
INTRAMUSCULAR | Status: DC | PRN
  Administered 2024-06-07: 10 mg via INTRAVENOUS

## 2024-06-07 MED ORDER — ONDANSETRON HCL 4 MG/2ML IJ SOLN
INTRAMUSCULAR | Status: DC | PRN
  Administered 2024-06-07: 4 mg via INTRAVENOUS

## 2024-06-07 NOTE — ED Notes (Addendum)
 Pt is alert, oriented to self only. Bed alarm verified on, nonskid socks verified on, DNR bedside, pt reoriented to the call bell. Pt denies needs at this time. Brief is dry. Pt remains on room air, IV is wrapped.

## 2024-06-07 NOTE — ED Provider Notes (Signed)
----------------------------------------- °  1:33 AM on 06/07/2024 ----------------------------------------- I spoke to [facility] hospitalist.  They have excepted the patient to [facility].  We will work on getting the patient transferred once a bed is assigned.   Dorothyann Drivers, MD 06/07/24 [phone]

## 2024-06-07 NOTE — ED Notes (Signed)
Report given to [facility] 

## 2024-06-07 NOTE — Progress Notes (Signed)
Patient arrived in the unit

## 2024-06-07 NOTE — H&P (Signed)
 History and Physical    Tanya Roy FMW:[id number] DOB: Oct 23, 1928 DOA: 06/07/2024  PCP: Diedra Lame, MD  Patient coming from: [facility]  I have personally briefly reviewed patient's old medical records in [facility]  Chief Complaint: Fall  HPI: Tanya Roy is a 89 y.o. female with medical history significant of hypertension, hyperlipidemia, CAD, CHF, osteoporosis, dementia, prior history of intertrochanteric femur fracture that underwent cephalomedullary rodding in 2018 presented after a witnessed mechanical fall at the nursing facility prior to arrival.  Upon my evaluation: Patient in PACU-not arousable due to anesthesia.  History gathered from charts.  At baseline patient is oriented to person only.  She was brought by EMS to [facility] where CT showed spiral minimally displaced periprosthetic fracture about the distal right femoral diaphysis, suspected chip fracture of the posterior medial epicondyles, lipohemarthrosis.  CT head/C-spine negative for any acute findings.  EDP discussed the case with orthopedic surgery at [place] who felt that patient needed to be transferred to [facility].  Patient underwent open reduction internal fixation of distal right femur fracture by Dr. Georgina.  Patient is DNR/DNI  ED Course: Afebrile, pulse 70, BP 113/58, on 6 L of oxygen via oxygen mask.  CBC from 06/06/2024 showed WBC of 11.3, H&H 11.7/34.9, MCV 100.3, PLT 160.  NA: 140, K: 4.2, BUN 29, CR: 0.84.  Reviewed imaging.  Orthopedic will see patient in consultation and requested admission by hospitalist services.   Review of Systems: As per HPI otherwise negative.    Past Medical History:  Diagnosis Date   Abnormal mammogram of right breast 06/26/2010   Microcalcifications   Arthritis    Confusion 02/14/2021   start before STEMI acc. to sister [place]   Myocardial infarction [facility])    Osteoporosis 2020    Past Surgical History:  Procedure Laterality Date   APPENDECTOMY      BREAST BIOPSY Right 06/27/2011   stereo, neg   CARDIAC CATHETERIZATION     CORONARY STENT INTERVENTION N/A 03/14/2021   Procedure: CORONARY STENT INTERVENTION;  Surgeon: Darron Deatrice LABOR, MD;  Location: [facility] INVASIVE CV LAB;  Service: Cardiovascular;  Laterality: N/A;   CORONARY/GRAFT ACUTE MI REVASCULARIZATION N/A 02/14/2021   Procedure: Coronary/Graft Acute MI Revascularization;  Surgeon: Darron Deatrice LABOR, MD;  Location: [facility] INVASIVE CV LAB;  Service: Cardiovascular;  Laterality: N/A;   FEMUR IM NAIL Right 08/29/2016   Procedure: INTRAMEDULLARY (IM) NAIL FEMORAL;  Surgeon: Ozell Flake, MD;  Location: [facility] ORS;  Service: Orthopedics;  Laterality: Right;   LEFT HEART CATH AND CORONARY ANGIOGRAPHY N/A 02/14/2021   Procedure: LEFT HEART CATH AND CORONARY ANGIOGRAPHY;  Surgeon: Darron Deatrice LABOR, MD;  Location: [facility] INVASIVE CV LAB;  Service: Cardiovascular;  Laterality: N/A;   MELANOMA EXCISION Right 03/26/2015   Procedure: MELANOMA EXCISION;  Surgeon: Carlin Pastel, MD;  Location: [facility] ORS;  Service: General;  Laterality: Right;   ORIF WRIST FRACTURE Right 08/29/2016   Procedure: OPEN REDUCTION INTERNAL FIXATION (ORIF) WRIST FRACTURE;  Surgeon: Ozell Flake, MD;  Location: [facility] ORS;  Service: Orthopedics;  Laterality: Right;   SKIN BIOPSY  03/11/2015   Back     reports that she has never smoked. She has never used smokeless tobacco. She reports that she does not drink alcohol and does not use drugs.  Allergies[1]  Family History  Problem Relation Age of Onset   Tuberculosis Mother    Heart disease Father    Breast cancer Neg Hx     Prior to Admission medications  Medication  Sig Start Date End Date Taking? Authorizing Provider  aspirin  EC 81 MG tablet Take 81 mg by mouth daily.   Yes [provider]  atorvastatin  (LIPITOR ) 20 MG tablet Take 20 mg by mouth daily. 05/25/23  Yes [provider]  Cyanocobalamin (B-12) 1000 MCG CAPS Take 1 capsule by mouth  daily. 02/12/23  Yes [provider]  empagliflozin  (JARDIANCE ) 10 MG TABS tablet TAKE 1 TABLET BY MOUTH DAILY BEFORE BREAKFAST 02/24/22  Yes Darron Deatrice LABOR, MD  ENTRESTO  24-26 MG TAKE 1 TABLE BY MOUTH 2 TIMES DAILY 08/11/22  Yes Darron Deatrice LABOR, MD  Multiple Vitamin (MULTIVITAMIN) tablet Take 1 tablet by mouth daily.   Yes [provider]  Vitamin D , Ergocalciferol , (DRISDOL ) 50000 units CAPS capsule Take 50,000 Units by mouth every 7 (seven) days.   Yes [provider]  carvedilol  (COREG ) 6.25 MG tablet TAKE 1 TABLET BY MOUTH 2 TIMES PER DAY 04/02/23   Darron Deatrice LABOR, MD  nitroGLYCERIN  (NITROSTAT ) 0.4 MG SL tablet Place 1 tablet (0.4 mg total) under the tongue every 5 (five) minutes as needed for chest pain. 03/04/21 05/31/23  [place] Mikey DEL, PA-C    Physical Exam: Vitals:   06/07/24 1808 06/07/24 1815 06/07/24 1830 06/07/24 1845  BP: (!) 99/50 (!) 106/57 (!) 104/50 (!) 107/52  Pulse: 69 67 63 63  Resp: 14 20 10 14   Temp: (!) 97 F (36.1 C)     TempSrc:      SpO2: 100% 100% 100% 100%    Constitutional:  on oxygen mask, drowsy, sleepy, unarousable, elderly female, pale looking, lying comfortably Eyes: PERRL, lids and conjunctivae normal ENMT: Mucous membranes are moist. Posterior pharynx clear of any exudate or lesions.Normal dentition.  Neck: normal, supple, no masses, no thyromegaly Respiratory: Transmitted upper airway breathing sounds present Cardiovascular: Regular rate and rhythm, no murmurs / rubs / gallops. No extremity edema. 2+ pedal pulses. No carotid bruits.  Abdomen: no tenderness, no masses palpated. No hepatosplenomegaly. Bowel sounds positive.  Musculoskeletal: Right hip: Dressing dry and intact. Neurologic: Sleepy, not arousable, not following commands  Labs on Admission: I have personally reviewed following labs and imaging studies  CBC: Recent Labs  Lab 06/06/24 2151  WBC 11.3*  NEUTROABS 8.8*  HGB 11.7*  HCT 34.9*  MCV 100.3*   PLT 160   Basic Metabolic Panel: Recent Labs  Lab 06/06/24 2151  NA 140  K 4.2  CL 106  CO2 28  GLUCOSE 130*  BUN 29*  CREATININE 0.84  CALCIUM  9.0   GFR: CrCl cannot be calculated (Unknown ideal weight.). Liver Function Tests: No results for input(s): AST, ALT, ALKPHOS, BILITOT, PROT, ALBUMIN  in the last 168 hours. No results for input(s): LIPASE, AMYLASE in the last 168 hours. No results for input(s): AMMONIA in the last 168 hours. Coagulation Profile: No results for input(s): INR, PROTIME in the last 168 hours. Cardiac Enzymes: No results for input(s): CKTOTAL, CKMB, CKMBINDEX, TROPONINI in the last 168 hours. BNP (last 3 results) No results for input(s): PROBNP in the last 8760 hours. HbA1C: No results for input(s): HGBA1C in the last 72 hours. CBG: No results for input(s): GLUCAP in the last 168 hours. Lipid Profile: No results for input(s): CHOL, HDL, LDLCALC, TRIG, CHOLHDL, LDLDIRECT in the last 72 hours. Thyroid Function Tests: No results for input(s): TSH, T4TOTAL, FREET4, T3FREE, THYROIDAB in the last 72 hours. Anemia Panel: No results for input(s): VITAMINB12, FOLATE, FERRITIN, TIBC, IRON, RETICCTPCT in the last 72 hours. Urine analysis:  Component Value Date/Time   COLORURINE YELLOW (A) 08/23/2023 0954   APPEARANCEUR HAZY (A) 08/23/2023 0954   LABSPEC 1.023 08/23/2023 0954   PHURINE 6.0 08/23/2023 0954   GLUCOSEU >=500 (A) 08/23/2023 0954   HGBUR NEGATIVE 08/23/2023 0954   BILIRUBINUR NEGATIVE 08/23/2023 0954   KETONESUR NEGATIVE 08/23/2023 0954   PROTEINUR NEGATIVE 08/23/2023 0954   NITRITE NEGATIVE 08/23/2023 0954   LEUKOCYTESUR NEGATIVE 08/23/2023 0954    Radiological Exams on Admission: DG FEMUR, MIN 2 VIEWS RIGHT Result Date: 06/07/2024 CLINICAL DATA:  [id number] Surgery, elective [id number] EXAM: RIGHT FEMUR 2 VIEWS COMPARISON:  June 06, 2024 FINDINGS: Spot fluoroscopy  images were obtained for surgical planning purposes. This demonstrates placement of an additional screw and plate fixation along the distal femur with corresponding improved alignment of the distal femoral fracture. Status post intramedullary rod fixation of the RIGHT femur. Time: 120 second Dose: 9.42 mGy Please reference procedure report for further details. IMPRESSION: Spot fluoroscopy images obtained for surgical planning purposes. Electronically Signed   By: Corean Salter M.D.   On: 06/07/2024 18:11   DG C-Arm 1-60 Min-No Report Result Date: 06/07/2024 Fluoroscopy was utilized by the requesting physician.  No radiographic interpretation.   DG C-Arm 1-60 Min-No Report Result Date: 06/07/2024 Fluoroscopy was utilized by the requesting physician.  No radiographic interpretation.   DG C-Arm 1-60 Min-No Report Result Date: 06/07/2024 Fluoroscopy was utilized by the requesting physician.  No radiographic interpretation.   CT KNEE RIGHT WO CONTRAST Result Date: 06/06/2024 EXAM: CT RIGHT KNEE, WITHOUT IV CONTRAST 06/06/2024 11:11:22 PM TECHNIQUE: Axial images were acquired through the right knee without IV contrast. Reformatted images were reviewed. Automated exposure control, iterative reconstruction, and/or weight based adjustment of the mA/kV was utilized to reduce the radiation dose to as low as reasonably achievable. COMPARISON: Same day x-ray. CLINICAL HISTORY: Right perisprosthetic fracture. FINDINGS: BONES: Spiral minimally displaced periprosthetic fracture about the distal right femoral diaphysis. The fracture line extends into the lateral epicondyle. Suspected chip fracture of the posterior medial epicondyle (series 10 image 71). JOINTS: No dislocation. The lipohemarthrosis suggests intraarticular extension though a discrete fracture line at the articular surface of the knee is not visualized. Moderate lipohemarthrosis. Chondrocalcinosis in the medial and lateral compartments. SOFT  TISSUES: The soft tissues are unremarkable. IMPRESSION: 1. Spiral minimally displaced periprosthetic fracture about the distal right femoral diaphysis. 2. Suspected chip fracture of the posterior medial epicondyle. 3. Lipohemarthrosis suggests intraarticular extension, though a discrete fracture line at the articular surface is not visualized. Electronically signed by: Norman Gatlin MD 06/06/2024 11:33 PM EST RP Workstation: [id number]   CT Cervical Spine Wo Contrast Result Date: 06/06/2024 EXAM: CT CERVICAL SPINE WITHOUT CONTRAST 06/06/2024 09:21:13 PM TECHNIQUE: CT of the cervical spine was performed without the administration of intravenous contrast. Multiplanar reformatted images are provided for review. Automated exposure control, iterative reconstruction, and/or weight based adjustment of the mA/kV was utilized to reduce the radiation dose to as low as reasonably achievable. COMPARISON: None available. CLINICAL HISTORY: Neck trauma (Age >= 65y) Neck trauma (Age >= 65y) FINDINGS: BONES AND ALIGNMENT: No acute fracture or traumatic malalignment. DEGENERATIVE CHANGES: No significant degenerative changes. SOFT TISSUES: No prevertebral soft tissue swelling. IMPRESSION: 1. No significant abnormality Electronically signed by: Oneil Devonshire MD 06/06/2024 09:27 PM EST RP Workstation: [id number]   CT Head Wo Contrast Result Date: 06/06/2024 EXAM: CT HEAD WITHOUT 06/06/2024 09:21:13 PM TECHNIQUE: CT of the head was performed without the administration of intravenous contrast. Automated exposure control, iterative reconstruction, and/or weight based  adjustment of the mA/kV was utilized to reduce the radiation dose to as low as reasonably achievable. COMPARISON: 08/23/2023 CLINICAL HISTORY: Head trauma, minor (Age >= 65y) FINDINGS: BRAIN AND VENTRICLES: No acute intracranial hemorrhage. No mass effect or midline shift. No extra-axial fluid collection. No evidence of acute infarct. ORBITS: Bilateral lens replacement  noted. SINUSES AND MASTOIDS: No acute abnormality. SOFT TISSUES AND SKULL: No acute skull fracture. No acute soft tissue abnormality. IMPRESSION: 1. No acute intracranial abnormality. Electronically signed by: Oneil Devonshire MD 06/06/2024 09:26 PM EST RP Workstation: [id number]   DG Hip Unilat W or Wo Pelvis 2-3 Views Right Result Date: 06/06/2024 EXAM: 2 VIEW(S) XRAY OF THE PELVIS AND RIGHT HIP 06/06/2024 09:19:00 PM COMPARISON: None available. CLINICAL HISTORY: Fall FINDINGS: BONES AND JOINTS: SI joints are symmetric. Right femur: Intramedullary rod and hip screw are in place. There is an acute distal femoral fracture with 1 shaft width lateral displacement of the distal fracture fragment. Pelvis: Remote healed right superior and inferior pubic rami fractures are noted. Hips: Mild symmetrical bilateral hip osteoarthritis is present. Bilateral hips demonstrate normal alignment. SOFT TISSUES: The soft tissues are unremarkable. IMPRESSION: 1. Acute distal femoral fracture with one shaft width lateral displacement of the distal fragment. 2. Intramedullary rod and hip screw in the right femur. 3. Remote healed right superior and inferior pubic rami fractures. Electronically signed by: Oneil Devonshire MD 06/06/2024 09:25 PM EST RP Workstation: [id number]   DG Knee Complete 4 Views Right Result Date: 06/06/2024 EXAM: 4 VIEW(S) XRAY OF THE RIGHT KNEE 06/06/2024 09:19:00 PM COMPARISON: None available. CLINICAL HISTORY: Fall FINDINGS: BONES AND JOINTS: Intramedullary rod in femur with distal interlocking screw. Oblique fracture through the mid to distal femur, at the level of the distal intramedullary rod. There is apex anterior angulation. No malalignment. Small joint effusion within the knee. SOFT TISSUES: The soft tissues are unremarkable. IMPRESSION: 1. Oblique fracture through the mid to distal femur at the level of the distal intramedullary rod with apex anterior angulation. 2. Small joint effusion within the knee.  Electronically signed by: Greig Pique MD 06/06/2024 09:23 PM EST RP Workstation: [id number]    EKG: Independently reviewed.  Sinus rhythm.  No acute ST-T wave changes noted.  Assessment/Plan  Right periprosthetic femur fracture: - Status post open reduction and internal fixation of distal femur fracture on 06/07/2024. - Admit patient on the floor.  On continuous pulse ox. - Monitor vitals closely and H&H closely - Will keep her n.p.o. since she is still drowsy from anesthesia  Status post fall: - CT head/cervical spine negative for any acute findings  Acute hypoxemic respiratory failure: - Currently require 3 L of oxygen via .  Not on oxygen at home. - Likely due to anesthesia.  Will try to wean off of oxygen as tolerated.  CAD status post PCI 02/15/2021 - On aspirin , statin, Coreg , Entresto , nitro as needed and Jardiance . - Will hold her home medications at this time since her BP is soft and she is NPO  HFrEF: - Euvolemic on exam.  Monitor signs for fluid overload. - Will hold Entresto , Jardiance , statin, Coreg  for now   Hyperlipidemia: - Hold statin  Leukocytosis: Likely reactive. - Afebrile.  Will continue to monitor  Dementia: - Aware.  Low hemoglobin: - Baseline hemoglobin between 13-14.  Will continue to monitor. - Check iron and ferritin and B12  DVT prophylaxis: SCD, Ortho recommended delay start of pharmacological VTE  (>24 hrs)due to increase risk of bleeding  Code Status: DNR/DNI Family  Communication: None present at bedside.  I discussed plan of care with patient's sisters at the waiting area.  I answered their questions.  Disposition Plan: To be determined Consults called: Orthopedic surgery Admission status: Inpatient   Velna JONELLE Skeeter MD [facility]  If 7PM-7AM, please contact night-coverage [email or web address]  06/07/2024, 7:03 PM        [1] No Known Allergies

## 2024-06-07 NOTE — Anesthesia Preprocedure Evaluation (Signed)
 Anesthesia Evaluation  Patient identified by MRN, date of birth, ID band Patient awake    Reviewed: Allergy & Precautions, NPO status , Patient's Chart, lab work & pertinent test results  Airway Mallampati: II  TM Distance: >3 FB Neck ROM: Full    Dental no notable dental hx.    Pulmonary neg pulmonary ROS   Pulmonary exam normal        Cardiovascular hypertension, Pt. on medications and Pt. on home beta blockers + CAD, + Past MI and + Cardiac Stents (2022)   Rhythm:Regular Rate:Normal     Neuro/Psych negative neurological ROS  negative psych ROS   GI/Hepatic negative GI ROS, Neg liver ROS,,,  Endo/Other  negative endocrine ROS    Renal/GU negative Renal ROS  negative genitourinary   Musculoskeletal  (+) Arthritis ,  Right femur fx   Abdominal Normal abdominal exam  (+)   Peds  Hematology Lab Results      Component                Value               Date                      WBC                      11.3 (H)            06/06/2024                HGB                      11.7 (L)            06/06/2024                HCT                      34.9 (L)            06/06/2024                MCV                      100.3 (H)           06/06/2024                PLT                      160                 06/06/2024              Anesthesia Other Findings   Reproductive/Obstetrics                              Anesthesia Physical Anesthesia Plan  ASA: 3  Anesthesia Plan: General   Post-op Pain Management: Ofirmev  IV (intra-op)*   Induction: Intravenous  PONV Risk Score and Plan: 3 and Ondansetron , Dexamethasone  and Treatment may vary due to age or medical condition  Airway Management Planned: Mask and Oral ETT  Additional Equipment: None  Intra-op Plan:   Post-operative Plan: Extubation in OR  Informed Consent: I have reviewed the patients History and Physical, chart, labs and  discussed the procedure including the risks, benefits and alternatives for the proposed  anesthesia with the patient or authorized representative who has indicated his/her understanding and acceptance.     Dental advisory given  Plan Discussed with: CRNA  Anesthesia Plan Comments:         Anesthesia Quick Evaluation

## 2024-06-07 NOTE — H&P (Signed)
 Orthopedic Surgery Consult Note  Assessment: Patient is a 89 y.o. female with right periprosthetic femur fracture   Plan: -Operative plans: planning for operative fixation today -Diet: NPO for procedure -DVT ppx: aspirin  81mg  BID post-operatively -Ancef  and TXA on call to OR -Weight bearing status: NWB RLE -PT evaluate and treat post-op -Pain control -Dispo: admit to medicine post-operatively   Discussed recommendation for operative intervention in the form of right femur fracture open reduction internal fixation with cephalomedullary rod versus plate fixation. If I can get the fracture reduced even with the existing rod in place, will plan for plate fixation. Explained that I would need to remove the existing rod if I cannot get the fracture reduced and would likely proceed with retrograde rod in that scenario. Explained the risks of this procedure included, but were not limited to: nonunion, malunion, hardware failure, infection, bleeding, stiffness, neurovascular injury, knee pain, need for additional procedures, deep vein thrombosis, pulmonary embolism, and death. The benefits of this procedure would be to promote fracture healing by providing stability. The alternatives of this surgery would be to treat the fracture with immobilization in traction or to do no intervention. The patient and her sister's questions were answered to their satisfaction. After this discussion, both the patient and her sister elected to proceed with surgery. Informed consent was obtained.   ___________________________________________________________________________   Reason for consult: right femur fracture  History:  Patient is a 89 y.o. female with history of prior intertrochanteric femur fracture that underwent cephlomedullary rodding in 2018 who had a ground level fall at her nursing home yesterday. Presented to [facility]. Orthopedics was consulted there and felt that this was out of their scope of practice so  requested transfer to [facility]. She was admitted to medicine overnight. Reporting right knee and thigh pain. No pain elsewhere.    Physical Exam:  General: no acute distress, appears stated age Neurologic: confused, not answering questions appropriately Cardiovascular: regular rate, no cyanosis Respiratory: unlabored breathing on room air, symmetric chest rise Psychiatric: appropriate affect, normal cadence to speech  MSK:   -Bilateral upper extremities  No tenderness to palpation over extremity, no gross deformity, no open wounds Fires deltoid, biceps, triceps, wrist extensors, wrist flexors, finger extensors, finger flexors  AIN/PIN/IO intact  Palpable radial pulse  Sensation intact to light touch in median/ulnar/radial/axillary nerve distributions  Hand warm and well perfused  -Left lower extremity  No tenderness to palpation over extremity, no gross deformity, no open wounds Fires hip flexors, quadriceps, hamstrings, tibialis anterior, gastrocnemius and soleus, extensor hallucis longus Plantarflexes and dorsiflexes toes Sensation intact to light touch in sural, saphenous, tibial, deep peroneal, and superficial peroneal nerve distributions Foot warm and well perfused  -Right lower extremities  TTP at the knee and distal thigh, no other tenderness to palpation over the extremity, gross deformity at the distal thigh, pain with log roll in the thigh, no open wounds seen Does not fire hip flexors, quadriceps, hamstrings due to pain. Fires tibialis anterior, gastrocnemius and soleus, extensor hallucis longus Plantarflexes and dorsiflexes toes Sensation intact to light touch in sural, saphenous, tibial, deep peroneal, and superficial peroneal nerve distributions Foot warm and well perfused  Imaging: XRs of the right femur from 06/06/2024 were independently reviewed and interpreted, showing a apex anterior spiral distal femur fracture. The femur fracture is around a prior cephalomedullary  rod. Distal to the rod there does not appear to be any extension of the fracture. Prior proximal femur fracture appears well healed.    Patient  name: Tanya Roy Patient MRN: [id number] Date: .06/07/2024

## 2024-06-07 NOTE — ED Notes (Signed)
 Pt assisted w/ bedpan to urinate and changed into hospital gown. Clothing placed in belongings bag and given to pt's sister, Ida.

## 2024-06-07 NOTE — ED Notes (Signed)
 Awaiting bed assignment per unit secretary.

## 2024-06-07 NOTE — Anesthesia Procedure Notes (Addendum)
 Procedure Name: Intubation Date/Time: 06/07/2024 2:46 PM  Performed by: Mollie Olivia SAUNDERS, CRNAPre-anesthesia Checklist: Patient identified, Emergency Drugs available, Suction available and Patient being monitored Patient Re-evaluated:Patient Re-evaluated prior to induction Oxygen Delivery Method: Circle system utilized Preoxygenation: Pre-oxygenation with 100% oxygen Induction Type: IV induction Ventilation: Mask ventilation without difficulty Laryngoscope Size: Glidescope and 3 Grade View: Grade I Tube type: Oral Tube size: 7.0 mm Number of attempts: 1 Airway Equipment and Method: Rigid stylet and Video-laryngoscopy Placement Confirmation: ETT inserted through vocal cords under direct vision, positive ETCO2 and breath sounds checked- equal and bilateral Tube secured with: Tape Dental Injury: Teeth and Oropharynx as per pre-operative assessment  Difficulty Due To: Difficulty was anticipated, Difficult Airway- due to reduced neck mobility and Difficult Airway- due to limited oral opening

## 2024-06-07 NOTE — Plan of Care (Signed)
 Plan of Care Note for accepted transfer   Patient name: Tanya Roy FMW:[id number] DOB: 06/05/1929  Facility requesting transfer: [facility] ED Requesting Provider: Dr. Dorothyann Facility course: 89 year old female with history of hypertension, hyperlipidemia, CAD, CHF, osteoporosis, dementia presented after witnessed mechanical fall at her nursing facility just prior to arrival, striking her head as well as her right leg.  CT showing spiral minimally displaced periprosthetic fracture about the distal right femoral diaphysis, suspected chip fracture of the posterior medial epicondyle, and lipohemarthrosis.  CT head/C-spine negative for acute findings.  WBC count 11.3, hemoglobin 11.7 (previously 15.4 in February 2025).  Not hypotensive.  EDP discussed the case with orthopedic surgeon at [facility] who felt that the patient needed to be transferred to [facility].  Case was discussed with orthopedics at [facility] Dr. Georgina who will see the patient in consultation and requested admission by hospitalist service.  Plan of care: The patient is accepted for admission to Telemetry unit at [facility].  [facility] will assume care on arrival to accepting facility. Until arrival, care as per EDP. However, [facility] available 24/7 for questions and assistance.  Check [email or web address] for on-call coverage.  Nursing staff, please call [facility] Admits & Consults System-Wide number under Amion on patient's arrival so appropriate admitting provider can evaluate the pt.

## 2024-06-07 NOTE — ED Notes (Signed)
 [facility] called at this time- waiting on bed OR wants pt there at noon.

## 2024-06-07 NOTE — Transfer of Care (Signed)
 Immediate Anesthesia Transfer of Care Note  Patient: Henna Derderian  Procedure(s) Performed: OPEN REDUCTION INTERNAL FIXATION (ORIF) DISTAL FEMUR FRACTURE (Right)  Patient Location: PACU  Anesthesia Type:General  Level of Consciousness: sedated, drowsy, and responds to stimulation  Airway & Oxygen Therapy: Patient Spontanous Breathing  Post-op Assessment: Report given to RN and Post -op Vital signs reviewed and stable  Post vital signs: Reviewed and stable  Last Vitals:  Vitals Value Taken Time  BP 106/57 06/07/24 18:15  Temp    Pulse 65 06/07/24 18:19  Resp 21 06/07/24 18:19  SpO2 100 % 06/07/24 18:19  Vitals shown include unfiled device data.  Last Pain:  Vitals:   06/07/24 1209  TempSrc: Oral  PainSc:          Complications: No notable events documented.

## 2024-06-07 NOTE — Progress Notes (Addendum)
 1331 blood pressure 92/48. Pt. Sleepy,arousable.Earlier complained of nausea after receiving coreg , and then nausea passed. 1345 blood pressure 79/40, pulse 60 sat 97% on room air. Notified Dr. Cleotis. Stated to open up fluids. 1352: pressure 91/33 (52) pulse 56. Sat 97% ,resp.21. Pt. States she feels better. 1402 blood pressure 105/68 (77), pulse 60, sat 97.  Dr. Cleotis and Dr. Georgina in to see pt.

## 2024-06-07 NOTE — Brief Op Note (Signed)
 06/07/2024  6:08 PM  PATIENT:  Tanya Roy  89 y.o. female  PRE-OPERATIVE DIAGNOSIS:  RIGHT FEMUR FRACTURE  POST-OPERATIVE DIAGNOSIS:  RIGHT FEMUR FRACTURE  PROCEDURE:  Procedures: OPEN REDUCTION INTERNAL FIXATION (ORIF) DISTAL FEMUR FRACTURE (Right)  SURGEON:  Surgeons and Role:    DEWAINE Georgina Ozell DELENA, MD - Primary  PHYSICIAN ASSISTANT: none  ASSISTANTS: none   ANESTHESIA:   general  EBL:  100 mL   BLOOD ADMINISTERED:none  DRAINS: none   LOCAL MEDICATIONS USED:  NONE  SPECIMEN:  No Specimen  DISPOSITION OF SPECIMEN:  N/A  COUNTS:  YES  TOURNIQUET:  NONE  DICTATION: .Note written in EPIC  PLAN OF CARE: Admit to inpatient   PATIENT DISPOSITION:  PACU - hemodynamically stable.   Delay start of Pharmacological VTE agent (>24hrs) due to surgical blood loss or risk of bleeding: no

## 2024-06-07 NOTE — Anesthesia Postprocedure Evaluation (Signed)
 Anesthesia Post Note  Patient: Masyn Fullam  Procedure(s) Performed: OPEN REDUCTION INTERNAL FIXATION (ORIF) DISTAL FEMUR FRACTURE (Right)     Patient location during evaluation: PACU Anesthesia Type: General Level of consciousness: awake and alert Pain management: pain level controlled Vital Signs Assessment: post-procedure vital signs reviewed and stable Respiratory status: spontaneous breathing, nonlabored ventilation, respiratory function stable and patient connected to nasal cannula oxygen Cardiovascular status: blood pressure returned to baseline and stable Postop Assessment: no apparent nausea or vomiting Anesthetic complications: no   No notable events documented.  Last Vitals:  Vitals:   06/07/24 1930 06/07/24 1945  BP: 97/61 (!) 120/54  Pulse: 71 62  Resp: 14 13  Temp:  36.6 C  SpO2: 100% 100%    Last Pain:  Vitals:   06/07/24 1945  TempSrc:   PainSc: Asleep                 Cordella SQUIBB Zandria Woldt

## 2024-06-08 LAB — FERRITIN: Ferritin: 94 ng/mL (ref 11–307)

## 2024-06-08 LAB — IRON AND TIBC
Iron: 20 ug/dL — ABNORMAL LOW (ref 28–170)
Saturation Ratios: 11 % (ref 10.4–31.8)
TIBC: 183 ug/dL — ABNORMAL LOW (ref 250–450)
UIBC: 163 ug/dL

## 2024-06-08 LAB — BASIC METABOLIC PANEL WITH GFR
Anion gap: 8 (ref 5–15)
BUN: 30 mg/dL — ABNORMAL HIGH (ref 8–23)
CO2: 25 mmol/L (ref 22–32)
Calcium: 8.2 mg/dL — ABNORMAL LOW (ref 8.9–10.3)
Chloride: 104 mmol/L (ref 98–111)
Creatinine, Ser: 0.71 mg/dL (ref 0.44–1.00)
GFR, Estimated: >60 mL/min (ref >60)
Glucose, Bld: 158 mg/dL — ABNORMAL HIGH (ref 70–99)
Potassium: 4.2 mmol/L (ref 3.5–5.1)
Sodium: 137 mmol/L (ref 135–145)

## 2024-06-08 LAB — CBC
HCT: 25.7 % — ABNORMAL LOW (ref 36.0–46.0)
HCT: 23.1 % — ABNORMAL LOW (ref 36.0–46.0)
Hemoglobin: 8.6 g/dL — ABNORMAL LOW (ref 12.0–15.0)
Hemoglobin: 7.8 g/dL — ABNORMAL LOW (ref 12.0–15.0)
MCH: 33.7 pg (ref 26.0–34.0)
MCH: 34.1 pg — ABNORMAL HIGH (ref 26.0–34.0)
MCHC: 33.5 g/dL (ref 30.0–36.0)
MCHC: 33.8 g/dL (ref 30.0–36.0)
MCV: 100.8 fL — ABNORMAL HIGH (ref 80.0–100.0)
MCV: 100.9 fL — ABNORMAL HIGH (ref 80.0–100.0)
Platelets: 102 K/uL — ABNORMAL LOW (ref 150–400)
Platelets: 97 K/uL — ABNORMAL LOW (ref 150–400)
RBC: 2.55 MIL/uL — ABNORMAL LOW (ref 3.87–5.11)
RBC: 2.29 MIL/uL — ABNORMAL LOW (ref 3.87–5.11)
RDW: 13.5 % (ref 11.5–15.5)
RDW: 13.7 % (ref 11.5–15.5)
WBC: 17.9 K/uL — ABNORMAL HIGH (ref 4.0–10.5)
WBC: 17 K/uL — ABNORMAL HIGH (ref 4.0–10.5)
nRBC: 0 % (ref 0.0–0.2)
nRBC: 0 % (ref 0.0–0.2)

## 2024-06-08 LAB — VITAMIN B12: Vitamin B-12: 1139 pg/mL — ABNORMAL HIGH (ref 180–914)

## 2024-06-08 MED ORDER — TRANEXAMIC ACID-NACL 1000-0.7 MG/100ML-% IV SOLN
1000.0000 mg | Freq: Once | INTRAVENOUS | Status: AC
  Administered 2024-06-08: 1000 mg via INTRAVENOUS
  Filled 2024-06-08: qty 100

## 2024-06-08 MED ORDER — MIRTAZAPINE 15 MG PO TABS
7.5000 mg | ORAL_TABLET | Freq: Every day | ORAL | Status: DC
  Administered 2024-06-08 – 2024-06-10 (×3): 7.5 mg via ORAL
  Filled 2024-06-08 (×3): qty 1

## 2024-06-08 MED ORDER — MORPHINE SULFATE (PF) 2 MG/ML IV SOLN: 2.0000 mg | INTRAVENOUS | Status: DC | PRN

## 2024-06-08 MED ORDER — HALOPERIDOL LACTATE 5 MG/ML IJ SOLN
1.0000 mg | Freq: Once | INTRAMUSCULAR | Status: AC
  Administered 2024-06-08: 1 mg via INTRAVENOUS
  Filled 2024-06-08: qty 1

## 2024-06-08 MED ORDER — POLYETHYLENE GLYCOL 3350 17 G PO PACK
17.0000 g | PACK | Freq: Every day | ORAL | Status: DC
  Administered 2024-06-08 – 2024-06-11 (×4): 17 g via ORAL
  Filled 2024-06-08 (×4): qty 1

## 2024-06-08 MED ORDER — ENSURE SURGERY PO LIQD
237.0000 mL | Freq: Two times a day (BID) | ORAL | Status: DC
  Administered 2024-06-08 – 2024-06-11 (×6): 237 mL via ORAL
  Filled 2024-06-08 (×8): qty 237

## 2024-06-08 MED ORDER — ASPIRIN 81 MG PO CHEW
81.0000 mg | CHEWABLE_TABLET | Freq: Two times a day (BID) | ORAL | Status: DC
  Administered 2024-06-08 – 2024-06-11 (×7): 81 mg via ORAL
  Filled 2024-06-08 (×7): qty 1

## 2024-06-08 MED ORDER — OXYCODONE HCL 5 MG PO TABS: 2.5000 mg | ORAL_TABLET | ORAL | Status: DC | PRN

## 2024-06-08 MED ORDER — ACETAMINOPHEN 500 MG PO TABS
1000.0000 mg | ORAL_TABLET | Freq: Three times a day (TID) | ORAL | Status: DC
  Administered 2024-06-08 – 2024-06-11 (×11): 1000 mg via ORAL
  Filled 2024-06-08 (×11): qty 2

## 2024-06-08 MED ORDER — CEFAZOLIN SODIUM-DEXTROSE 2-4 GM/100ML-% IV SOLN
2.0000 g | Freq: Three times a day (TID) | INTRAVENOUS | Status: AC
  Administered 2024-06-08 (×3): 2 g via INTRAVENOUS
  Filled 2024-06-08 (×3): qty 100

## 2024-06-08 NOTE — Evaluation (Signed)
 Physical Therapy Evaluation Patient Details Name: Tanya Roy MRN: [id number] DOB: 1928-11-29 Today's Date: 06/08/2024  History of Present Illness  89 y.o. female presents to [facility] 06/07/24 from ALF after falling. Pt with R periprosthetic femur fx s/p ORIF on 12/13. Also with acute hypoxemic respiratory failure likely 2/2 anesthesia. PMHx:  hypertension, hyperlipidemia, CAD, CHF, osteoporosis, dementia, MI   Clinical Impression  Pt lives alone at [facility] with staff close by when ambulating short distances with a RW. Pt also would have assist for dressing and bathing. Educated pt/family on [facility] precautions with pt having difficulty with memory and adherence during eval (hx of dementia). Pt required MinA/CGA for bed mobility and CGA when seated on EOB. Pt was able to stand with RW and ModA to prop R foot and prevent full WB as pt would attempt to push down. Pt had a slight posterior lean when keeping to R LE TDWB. Once seated back on the EOB, pt was able to perform lateral scoots towards [facility] with MinA to prop R LE. Pt also able to demonstrate exercises in supine for strengthening. Recommending <3hrs post acute rehab to work towards independence with mobility. Acute PT to follow.       If plan is discharge home, recommend the following: A lot of help with walking and/or transfers;A lot of help with bathing/dressing/bathroom;Help with stairs or ramp for entrance;Assist for transportation   Can travel by private vehicle   No    Equipment Recommendations Wheelchair (measurements PT);Wheelchair cushion (measurements PT);BSC/3in1     Functional Status Assessment Patient has had a recent decline in their functional status and demonstrates the ability to make significant improvements in function in a reasonable and predictable amount of time.     Precautions / Restrictions Precautions Precautions: Fall Recall of Precautions/Restrictions: Impaired Restrictions Weight Bearing  Restrictions Per Provider Order: Yes RLE Weight Bearing Per Provider Order: Touchdown weight bearing      Mobility  Bed Mobility Overal bed mobility: Needs Assistance Bed Mobility: Supine to Sit, Sit to Supine    Supine to sit: Min assist, HOB elevated, Used rails Sit to supine: Contact guard assist   General bed mobility comments: able to bring LE's towards EOB and raise trunk with MinA for steadying assist. Increased time and effort to scoot forwards towards EOB. CGA for safety to return to supine    Transfers Overall transfer level: Needs assistance Equipment used: Rolling walker (2 wheels) Transfers: Sit to/from Stand, Bed to chair/wheelchair/BSC Sit to Stand: Mod assist     Lateral/Scoot Transfers: Min assist General transfer comment: Slight assist to boost-up with R LE propped on therapists ankle to prevent WB. Increased difficulty maintaining WB precautions as pt would attempt to push down. Posterior lean when standing. Able to scoot laterally towards [facility] with MinA to prop R LE and prevent full WB.    Ambulation/Gait  General Gait Details: unable this date    Balance Overall balance assessment: Needs assistance, Mild deficits observed, not formally tested, History of Falls Sitting-balance support: Bilateral upper extremity supported, Feet supported Sitting balance-Leahy Scale: Fair   Postural control: Posterior lean Standing balance support: Bilateral upper extremity supported, During functional activity, Reliant on assistive device for balance Standing balance-Leahy Scale: Poor Standing balance comment: reliant on RW and external support with posterior lean       Pertinent Vitals/Pain Pain Assessment Pain Assessment: No/denies pain    Home Living Family/patient expects to be discharged to:: Skilled nursing facility    Additional Comments: Home  Place in [place]    Prior Function Prior Level of Function : Needs assist;History of Falls (last six months)     Mobility Comments: Staff close by when ambulating with RW for short distances ADLs Comments: Assist for dressing, bathing. Goes to dining hall for meals     Extremity/Trunk Assessment   Upper Extremity Assessment Upper Extremity Assessment: Defer to OT evaluation    Lower Extremity Assessment Lower Extremity Assessment: RLE deficits/detail RLE Deficits / Details: At least 3/5, deferred formal MMT. Limited R DF AROM RLE Sensation: WNL    Cervical / Trunk Assessment Cervical / Trunk Assessment: Kyphotic  Communication   Communication Communication: No apparent difficulties    Cognition Arousal: Alert Behavior During Therapy: WFL for tasks assessed/performed   PT - Cognitive impairments: History of cognitive impairments, Awareness, Memory, Attention, Sequencing, Problem solving, Safety/Judgement    PT - Cognition Comments: Hx of dementia. Required frequent repetitions for instructions on WB precautions. Cueing for sequencing and technique Following commands: Impaired Following commands impaired: Follows one step commands inconsistently, Follows one step commands with increased time     Cueing Cueing Techniques: Verbal cues, Tactile cues, Visual cues        Exercises General Exercises - Lower Extremity Ankle Circles/Pumps: AROM, Both, 5 reps, Supine Quad Sets: AROM, Both, Supine (2 reps) Heel Slides: AROM, Right, Supine (2 reps) Hip ABduction/ADduction: AROM, Right, Supine (2 reps) Straight Leg Raises: AROM, Right, Supine (2 reps)   Assessment/Plan    PT Assessment Patient needs continued PT services  PT Problem List Decreased strength;Decreased range of motion;Decreased activity tolerance;Decreased balance;Decreased mobility;Decreased cognition;Decreased knowledge of use of DME;Decreased safety awareness;Decreased knowledge of precautions       PT Treatment Interventions DME instruction;Gait training;Functional mobility training;Therapeutic activities;Therapeutic  exercise;Balance training;Neuromuscular re-education;Patient/family education;Wheelchair mobility training    PT Goals (Current goals can be found in the Care Plan section)  Acute Rehab PT Goals Patient Stated Goal: to go home PT Goal Formulation: With patient/family Time For Goal Achievement: 06/22/24 Potential to Achieve Goals: Good    Frequency Min 2X/week        AM-PAC PT 6 Clicks Mobility  Outcome Measure Help needed turning from your back to your side while in a flat bed without using bedrails?: A Little Help needed moving from lying on your back to sitting on the side of a flat bed without using bedrails?: A Little Help needed moving to and from a bed to a chair (including a wheelchair)?: A Lot Help needed standing up from a chair using your arms (e.g., wheelchair or bedside chair)?: A Lot Help needed to walk in hospital room?: Total Help needed climbing 3-5 steps with a railing? : Total 6 Click Score: 12    End of Session Equipment Utilized During Treatment: Gait belt Activity Tolerance: Patient tolerated treatment well Patient left: in bed;with call bell/phone within reach;with bed alarm set;with family/visitor present Nurse Communication: Mobility status;Other (comment) (would benefit from drop arm recliner) PT Visit Diagnosis: Unsteadiness on feet (R26.81);Other abnormalities of gait and mobility (R26.89);Muscle weakness (generalized) (M62.81);History of falling (Z91.81)    Time: 8557-8485 PT Time Calculation (min) (ACUTE ONLY): 32 min   Charges:   PT Evaluation $PT Eval Low Complexity: 1 Low PT Treatments $Therapeutic Exercise: 8-22 mins PT General Charges $$ ACUTE PT VISIT: 1 Visit    Kate ORN, PT, DPT Secure Chat Preferred  Rehab Office [phone]  Kate BRAVO Wendolyn 06/08/2024, 4:50 PM

## 2024-06-08 NOTE — Progress Notes (Signed)
 PROGRESS NOTE    Tanya Roy  FMW:[id number] DOB: July 01, 1928 DOA: 06/07/2024 PCP: Diedra Lame, MD    Brief Narrative:  Tanya Roy is a 89 y.o. female with medical history significant of hypertension, hyperlipidemia, CAD, CHF, osteoporosis, dementia, prior history of intertrochanteric femur fracture that underwent cephalomedullary rodding in 2018 presented after a witnessed mechanical fall at the nursing facility prior to arrival.    Assessment and Plan: Right periprosthetic femur fracture: - Status post open reduction and internal fixation of distal femur fracture on 06/07/2024. - Monitor vitals closely and H&H closely -PT/OT eval    Status post fall: - CT head/cervical spine negative for any acute findings   Acute hypoxemic respiratory failure: - Weaned to room air   CAD status post PCI 02/15/2021 - On aspirin , statin, Coreg , Entresto , nitro as needed and Jardiance . - hold her home medications at this time since her BP is soft and she is NPO   HFrEF: - Euvolemic on exam.  Monitor signs for fluid overload. - Will hold Entresto , Jardiance , statin, Coreg  for now    Hyperlipidemia: - Hold statin   Leukocytosis: Likely reactive. - Afebrile -Recheck in the a.m.   Dementia: - At baseline   Acute blood loss anemia status post procedure - Baseline hemoglobin between 13-14.   -Recheck in the a.m., transfuse for less than 7 DVT prophylaxis: SCDs Start: 06/07/24 1901    Code Status: Limited: Do not attempt resuscitation (DNR) -DNR-LIMITED -Do Not Intubate/DNI    Disposition Plan:  Level of care: Telemetry Status is: Inpatient     Consultants:  Orthopedics    Subjective: Eating breakfast, no complaints  Objective: Vitals:   06/08/24 0403 06/08/24 0500 06/08/24 0601 06/08/24 0825  BP: (!) 95/50 100/60 (!) 113/56 (!) 111/36  Pulse:   76 71  Resp:   17 16  Temp:   (!) 97.4 F (36.3 C) 98 F (36.7 C)  TempSrc:   Oral Oral   SpO2:   94% 97%    Intake/Output Summary (Last 24 hours) at 06/08/2024 1252 Last data filed at 06/08/2024 0646 Gross per 24 hour  Intake 2100 ml  Output 550 ml  Net 1550 ml   There were no vitals filed for this visit.  Examination:   General: Appearance:    Thin female in no acute distress     Lungs:     respirations unlabored  Heart:    Normal heart rate   MS:   All extremities are intact.    Neurologic:   Awake, alert       Data Reviewed: I have personally reviewed following labs and imaging studies  CBC: Recent Labs  Lab 06/06/24 2151 06/08/24 0233 06/08/24 0811  WBC 11.3* 17.9* 17.0*  NEUTROABS 8.8*  --   --   HGB 11.7* 8.6* 7.8*  HCT 34.9* 25.7* 23.1*  MCV 100.3* 100.8* 100.9*  PLT 160 102* 97*   Basic Metabolic Panel: Recent Labs  Lab 06/06/24 2151 06/08/24 0233  NA 140 137  K 4.2 4.2  CL 106 104  CO2 28 25  GLUCOSE 130* 158*  BUN 29* 30*  CREATININE 0.84 0.71  CALCIUM  9.0 8.2*   GFR: CrCl cannot be calculated (Unknown ideal weight.). Liver Function Tests: No results for input(s): AST, ALT, ALKPHOS, BILITOT, PROT, ALBUMIN  in the last 168 hours. No results for input(s): LIPASE, AMYLASE in the last 168 hours. No results for input(s): AMMONIA in the last 168 hours. Coagulation Profile: No results for input(s): INR,  PROTIME in the last 168 hours. Cardiac Enzymes: No results for input(s): CKTOTAL, CKMB, CKMBINDEX, TROPONINI in the last 168 hours. BNP (last 3 results) No results for input(s): PROBNP in the last 8760 hours. HbA1C: No results for input(s): HGBA1C in the last 72 hours. CBG: No results for input(s): GLUCAP in the last 168 hours. Lipid Profile: No results for input(s): CHOL, HDL, LDLCALC, TRIG, CHOLHDL, LDLDIRECT in the last 72 hours. Thyroid Function Tests: No results for input(s): TSH, T4TOTAL, FREET4, T3FREE, THYROIDAB in the last 72 hours. Anemia Panel: Recent Labs     06/08/24 0233  VITAMINB12 1,139*  FERRITIN 94  TIBC 183*  IRON 20*   Sepsis Labs: No results for input(s): PROCALCITON, LATICACIDVEN in the last 168 hours.  No results found for this or any previous visit (from the past 240 hours).       Radiology Studies: DG FEMUR, MIN 2 VIEWS RIGHT Result Date: 06/07/2024 CLINICAL DATA:  [id number] Surgery, elective [id number] EXAM: RIGHT FEMUR 2 VIEWS COMPARISON:  June 06, 2024 FINDINGS: Spot fluoroscopy images were obtained for surgical planning purposes. This demonstrates placement of an additional screw and plate fixation along the distal femur with corresponding improved alignment of the distal femoral fracture. Status post intramedullary rod fixation of the RIGHT femur. Time: 120 second Dose: 9.42 mGy Please reference procedure report for further details. IMPRESSION: Spot fluoroscopy images obtained for surgical planning purposes. Electronically Signed   By: Corean Salter M.D.   On: 06/07/2024 18:11   DG C-Arm 1-60 Min-No Report Result Date: 06/07/2024 Fluoroscopy was utilized by the requesting physician.  No radiographic interpretation.   DG C-Arm 1-60 Min-No Report Result Date: 06/07/2024 Fluoroscopy was utilized by the requesting physician.  No radiographic interpretation.   DG C-Arm 1-60 Min-No Report Result Date: 06/07/2024 Fluoroscopy was utilized by the requesting physician.  No radiographic interpretation.   CT KNEE RIGHT WO CONTRAST Result Date: 06/06/2024 EXAM: CT RIGHT KNEE, WITHOUT IV CONTRAST 06/06/2024 11:11:22 PM TECHNIQUE: Axial images were acquired through the right knee without IV contrast. Reformatted images were reviewed. Automated exposure control, iterative reconstruction, and/or weight based adjustment of the mA/kV was utilized to reduce the radiation dose to as low as reasonably achievable. COMPARISON: Same day x-ray. CLINICAL HISTORY: Right perisprosthetic fracture. FINDINGS: BONES: Spiral minimally  displaced periprosthetic fracture about the distal right femoral diaphysis. The fracture line extends into the lateral epicondyle. Suspected chip fracture of the posterior medial epicondyle (series 10 image 71). JOINTS: No dislocation. The lipohemarthrosis suggests intraarticular extension though a discrete fracture line at the articular surface of the knee is not visualized. Moderate lipohemarthrosis. Chondrocalcinosis in the medial and lateral compartments. SOFT TISSUES: The soft tissues are unremarkable. IMPRESSION: 1. Spiral minimally displaced periprosthetic fracture about the distal right femoral diaphysis. 2. Suspected chip fracture of the posterior medial epicondyle. 3. Lipohemarthrosis suggests intraarticular extension, though a discrete fracture line at the articular surface is not visualized. Electronically signed by: Norman Gatlin MD 06/06/2024 11:33 PM EST RP Workstation: [id number]   CT Cervical Spine Wo Contrast Result Date: 06/06/2024 EXAM: CT CERVICAL SPINE WITHOUT CONTRAST 06/06/2024 09:21:13 PM TECHNIQUE: CT of the cervical spine was performed without the administration of intravenous contrast. Multiplanar reformatted images are provided for review. Automated exposure control, iterative reconstruction, and/or weight based adjustment of the mA/kV was utilized to reduce the radiation dose to as low as reasonably achievable. COMPARISON: None available. CLINICAL HISTORY: Neck trauma (Age >= 65y) Neck trauma (Age >= 65y) FINDINGS: BONES AND ALIGNMENT: No acute fracture  or traumatic malalignment. DEGENERATIVE CHANGES: No significant degenerative changes. SOFT TISSUES: No prevertebral soft tissue swelling. IMPRESSION: 1. No significant abnormality Electronically signed by: Oneil Devonshire MD 06/06/2024 09:27 PM EST RP Workstation: [id number]   CT Head Wo Contrast Result Date: 06/06/2024 EXAM: CT HEAD WITHOUT 06/06/2024 09:21:13 PM TECHNIQUE: CT of the head was performed without the administration of  intravenous contrast. Automated exposure control, iterative reconstruction, and/or weight based adjustment of the mA/kV was utilized to reduce the radiation dose to as low as reasonably achievable. COMPARISON: 08/23/2023 CLINICAL HISTORY: Head trauma, minor (Age >= 65y) FINDINGS: BRAIN AND VENTRICLES: No acute intracranial hemorrhage. No mass effect or midline shift. No extra-axial fluid collection. No evidence of acute infarct. ORBITS: Bilateral lens replacement noted. SINUSES AND MASTOIDS: No acute abnormality. SOFT TISSUES AND SKULL: No acute skull fracture. No acute soft tissue abnormality. IMPRESSION: 1. No acute intracranial abnormality. Electronically signed by: Oneil Devonshire MD 06/06/2024 09:26 PM EST RP Workstation: [id number]   DG Hip Unilat W or Wo Pelvis 2-3 Views Right Result Date: 06/06/2024 EXAM: 2 VIEW(S) XRAY OF THE PELVIS AND RIGHT HIP 06/06/2024 09:19:00 PM COMPARISON: None available. CLINICAL HISTORY: Fall FINDINGS: BONES AND JOINTS: SI joints are symmetric. Right femur: Intramedullary rod and hip screw are in place. There is an acute distal femoral fracture with 1 shaft width lateral displacement of the distal fracture fragment. Pelvis: Remote healed right superior and inferior pubic rami fractures are noted. Hips: Mild symmetrical bilateral hip osteoarthritis is present. Bilateral hips demonstrate normal alignment. SOFT TISSUES: The soft tissues are unremarkable. IMPRESSION: 1. Acute distal femoral fracture with one shaft width lateral displacement of the distal fragment. 2. Intramedullary rod and hip screw in the right femur. 3. Remote healed right superior and inferior pubic rami fractures. Electronically signed by: Oneil Devonshire MD 06/06/2024 09:25 PM EST RP Workstation: [id number]   DG Knee Complete 4 Views Right Result Date: 06/06/2024 EXAM: 4 VIEW(S) XRAY OF THE RIGHT KNEE 06/06/2024 09:19:00 PM COMPARISON: None available. CLINICAL HISTORY: Fall FINDINGS: BONES AND JOINTS:  Intramedullary rod in femur with distal interlocking screw. Oblique fracture through the mid to distal femur, at the level of the distal intramedullary rod. There is apex anterior angulation. No malalignment. Small joint effusion within the knee. SOFT TISSUES: The soft tissues are unremarkable. IMPRESSION: 1. Oblique fracture through the mid to distal femur at the level of the distal intramedullary rod with apex anterior angulation. 2. Small joint effusion within the knee. Electronically signed by: Greig Pique MD 06/06/2024 09:23 PM EST RP Workstation: [id number]        Scheduled Meds:  acetaminophen   1,000 mg Oral Q8H   aspirin   81 mg Oral BID   feeding supplement  237 mL Oral BID BM   Continuous Infusions:   ceFAZolin  (ANCEF ) IV 2 g (06/08/24 0926)     LOS: 1 day    Time spent: 45 minutes spent on chart review, discussion with nursing staff, consultants, updating family and interview/physical exam; more than 50% of that time was spent in counseling and/or coordination of care.    Harlene RAYMOND Bowl, DO [facility] Available via Epic secure chat 7am-7pm After these hours, please refer to coverage provider listed on [email or web address] 06/08/2024, 12:52 PM

## 2024-06-08 NOTE — Progress Notes (Signed)
 Orthopedic Surgery Progress Note   Assessment: Patient is a 89 y.o. female with right femur fracture status post ORIF   Plan: -Operative plans: complete -Diet: regular -DVT ppx: aspirin  81mg  BID -Antibiotics: ancef  x2 post-op doses -Weight bearing status: TDWB RLE -PT evaluate and treat -Pain control -Dispo: per primary  ___________________________________________________________________________  Subjective: No acute events overnight. Pain well controlled. Was able to get some sleep last night. No complaints this morning.    Physical Exam:  General: no acute distress, appears stated age Neurologic: sleeping but awakes to voice, answering questions appropriately, following commands Respiratory: unlabored breathing on room air, symmetric chest rise Psychiatric: appropriate affect, normal cadence to speech  MSK:   -Right lower extremity  Dressing over right thigh c/d/i EHL/TA/GSC intact Plantarflexes and dorsiflexes toes Sensation intact to light touch in sural, saphenous, tibial, deep peroneal, and superficial peroneal nerve distributions Foot warm and well perfused   Yesterday's total administered Morphine  Milligram Equivalents: 40   Patient name: Tanya Roy Patient MRN: [id number] Date: 06/08/2024

## 2024-06-08 NOTE — Plan of Care (Signed)
   Problem: Education: Goal: Knowledge of General Education information will improve Description Including pain rating scale, medication(s)/side effects and non-pharmacologic comfort measures Outcome: Progressing

## 2024-06-08 NOTE — Plan of Care (Signed)
  Problem: Education: Goal: Knowledge of General Education information will improve Description: Including pain rating scale, medication(s)/side effects and non-pharmacologic comfort measures Outcome: Progressing   Problem: Health Behavior/Discharge Planning: Goal: Ability to manage health-related needs will improve Outcome: Progressing   Problem: Activity: Goal: Risk for activity intolerance will decrease Outcome: Progressing   Problem: Nutrition: Goal: Adequate nutrition will be maintained Outcome: Completed/Met

## 2024-06-08 NOTE — Discharge Instructions (Addendum)
 Orthopedic Surgery Discharge Instructions  Patient name: Tanya Roy Fracture: right femur fracture Procedure Performed: right femur fracture open reduction internal fixation Date of Surgery: 06/07/2024 Surgeon: Ozell Ada, MD  Activity: You are allowed to rest your right leg on the ground, but should not put any weight through it. This is called touch down weight bearing. The plate and screws in your leg are not designed to bear full weight through them. They can break if you start weight bearing on them too early.   Incision Care: Your incisions sites have dressings over them. Those dressings should remain in place and dry at all times for a total of one week after surgery. After one week, you can remove the dressings. Underneath the dressings, you will find skin staples. You should leave these staples in place. They will be taken out in the office when the wound has healed. Do not pick, rub, or scrub at them. Do not put cream or lotion over the surgical area. After one week and once the dressing is off, it is okay to let soap and water run over your incision. Again, do not pick, scrub, or rub at the staples when bathing. Do not submerge (e.g., take a bath, swim, go in a hot tub, etc.) until six weeks after surgery. There may be some bloody drainage from the incision into the dressing after surgery. This is normal. You do not need to replace the dressing. Continue to leave it in place for the one week as instructed above. Should the dressing become saturated with blood or drainage, please call the office for further instructions.   Medications: You have been prescribed oxycodone . This is a narcotic pain medication and should only be taken as prescribed. You should not drink alcohol or operate heavy machinery (including driving) while taking this medication. The oxycodone  can cause constipation as a side effect. For that reason, you have been prescribed senna and miralax . These are both  laxatives. You do not need to take this medication if you develop diarrhea. Should you remain constipated even while taking the senna and miralax , please use the miralax  twice daily. Tylenol  has been prescribed to be taken every 8 hours, which will give you additional pain relief.   You have been prescribed aspirin  as a blood thinner. This medication is to be taken to prevent blood clots. Take 81 milligrams twice daily. You should refrain from using other blood thinners (warfarin, apixaban, plavix, xarelto, etc.) while using the aspirin . You will need to take this medication for a total of 6 weeks after your surgery.   You should not use over-the-counter NSAIDs (ibuprofen, Aleve, Celebrex, naproxen, meloxicam, etc.) for pain relief because aspirin  is a similar medication. There can be side effects including but not limited to kidney injury and ulcers if you take these type of medications with the aspirin .  In order to set expectations for opioid prescriptions, you will only be prescribed opioids for a total of six weeks after surgery and, at two-weeks after surgery, your opioid prescription will start to tapered (decreased dosage and number of pills). If you have ongoing need for opioid medication six weeks after surgery, you will be referred to pain management. If you are already established with a provider that is giving you opioid medications, you should schedule an appointment with them for six weeks after surgery if you feel you are going to need another prescription. State law only allows for opioid prescriptions one week at a time. If you are  running out of opioid medication near the end of the week, please call the office during business hours before running out so I can send you another prescription.   Diet: You are safe to resume your regular diet after surgery.   Reasons to Call the Office After Surgery: You should feel free to call the office with any concerns or questions you have in the  post-operative period, but you should definitely notify the office if you develop: -shortness of breath, chest pain, or trouble breathing -excessive bleeding, drainage, redness, or swelling around the surgical site -fevers, chills, or pain that is getting worse with each passing day -persistent nausea or vomiting -new weakness in the right lower extremity, new or worsening numbness or tingling in the right lower extremity -other concerns about your surgery  Follow Up Appointments: You have a follow up appointment scheduled for 06/30/2024 at 1:15pm. The office location and phone number are listed below. Please arrive on time to your appointment.   Office Information:  -Ozell Ada, MD -Phone number: [phone] -Address: [address]       [place], [place] [address]

## 2024-06-08 NOTE — Op Note (Signed)
 Orthopedic Surgery Operative Report   Procedure: Right femoral shaft fracture open reduction internal fixation   Modifier: none   Date of procedure: 06/07/2024   Patient name: Tanya Roy   MRN: [id number]  DOB: 1928/07/04   Surgeon: Ozell Ada, MD Assistant: none Pre-operative diagnosis: right displaced, spiral femoral shaft fracture adjacent to a prior placed antegrade femoral rod Post-operative diagnosis: same as above Findings: displaced, spiral femoral shaft fracture   Specimens: none Anesthesia: general EBL: 100cc Complications: none Pre-incision antibiotic: ancef  TXA was given prior to incision   Implants:  Implant Name Type Inv. Item Serial No. Manufacturer Lot No. LRB No. Used Action  PLATE DIST EVOS 3.5X297 14H RT - [id number] Plate PLATE DIST EVOS 3.5X297 14H RT  [employer]  Right 1 Implanted  SCREW S-T CORTEX 4.5X48 - [id number] Screw SCREW S-T CORTEX 4.5X48  [employer]  Right 1 Implanted  SCREW LOCK ST EVOS 4.5X64 - [id number] Screw SCREW LOCK ST EVOS 4.5X64  Oh [employer]  Right 1 Implanted  SCREW LOCK ST EVOS 4.5X76 - [id number] Screw SCREW LOCK ST EVOS 4.5X76  [place]  Right 1 Implanted  SCREW LOCK ST EVOS 4.5X74 - [id number] Screw SCREW LOCK ST EVOS 4.5X74  [place]ner [employer]  Right 1 Implanted  SCREW CORT 3.5X14 ST EVOS - [id number] Screw SCREW CORT 3.5X14 ST EVOS  Scheier [employer]  Right 1 Implanted  SCREW CORT 3.5X22 ST EVOS - [id number] Screw SCREW CORT 3.5X22 ST EVOS  Sandora [employer]  Right 3 Implanted  SCREW CORT 3.5X20 ST EVOS - [id number] Screw SCREW CORT 3.5X20 ST EVOS  Claar [employer]  Right 1 Implanted  SCREW CORT EVOS ST 3.5X12 - [id number] Screw SCREW CORT EVOS ST 3.5X12  [employer]  Right 1 Explanted  SCREW LOCK ST EVOS 4.5X78 STL - [id number] Screw SCREW LOCK ST EVOS 4.5X78 STL  [employer]  Right 1 Implanted  SCREW LOCK ST EVOS 4.5X36 STL - [id number] Screw SCREW LOCK ST EVOS 4.5X36 STL  [employer]izer [employer]  Right 1 Implanted  SCREW CTX 3.5X38MM EVOS - [id number] Screw SCREW CTX 3.5X38MM EVOS  [place]  Right 1 Explanted  CABLE CERLAGE W/CRIMP 1.8 - [id number] Cable CABLE CERLAGE W/CRIMP 1.8  [employer](ORTH,TRAU,BIO,SG) [id number] Right 1 Implanted       Indication for procedure: Patient is a 89 y.o. female who initially presented to [facility] after a ground-level fall with right thigh pain.  The patient was found to have a spiral, femoral shaft fracture around a prior antegrade femoral nail.  The on-call physician did not feel that he could manage this fracture so the patient was transferred to [facility], ER.  The patient was admitted to the medicine service. I met the patient and discussed the fracture. I recommended operative management in the form of open reduction internal fixation.  I covered the risks, benefits, and alternatives of the surgery with the patient and her sister.  All of their questions were answered to their satisfaction. After our discussion, the patient and her sister elected to proceed with surgery.    Procedure Description: The patient was met in the pre-operative holding area. The patient's identity and consent were verified. The operative site was marked by myself. The patient's remaining questions about the surgery were answered. The patient was brought back to the operating room. General anesthesia was induced and  an endotracheal tube was placed by the anesthesia staff. The patient was transferred to the [facility] table in the supine position. All bony prominences were well padded. The surgical area was cleansed with alcohol.  Ancef  and TXA were administered by anesthesia. The patient's skin was then prepped and draped in a standard, sterile fashion. A time out was performed that identified the  patient, the procedure, and the operative site. All team members agreed with what was stated in the time out.    A longitudinal incision was made over the lateral aspect of the distal femur.  The incision was taken sharply down through the skin, dermis, and IT band.  A key elevator was used to expose the distal and lateral aspect of the femur.  A lateral femoral plate was selected and and then slid submuscularly along the lateral femoral cortex.  The plate was applied over the distal femoral cortex in appropriate position.  A K wire was used to hold it in place.    AP and lateral fluoroscopic confirmed satisfactory length of the plate.  The plate was slightly anterior on the lateral view.  The fracture was also noted to be slightly displaced on the AP view. A 3 cm incision was made over the lateral femur at the site of the fracture.  A key elevator was used to elevate the soft tissue off of the femur both anteriorly and posteriorly.  A standard reduction clamp was then placed into this wound around the fracture and on the lateral aspect of the plate.  The plate was rotated so that it was sitting along the lateral cortex of the femur.  The clamp was then tightened and secured in place.  AP and lateral fluoroscopic images confirmed appropriate position of the plate and improved alignment of the fracture.  Using the targeting jig, a stab incision was made over the proximal aspect of the femur at the most proximal hole in the plate.  The incision was taken sharply down through the IT band.  The targeting guide was placed through the jig into one of the plates holes.  A drill was then advanced bicortically through this targeting guide and left in place.  Attention was then turned to the distal locking cluster.  A locking tower guide was placed into the plate.  The distal femur was drilled bicortically for all of the distal locking holes.  A depth gauge was used to estimate the length of the screw.  That length locking  screw was then inserted to the plate.  This process was repeated to insert a total of 5 distal locking screws.  Just distal to the fracture site, another screw was placed.  A screw guide was attached to the plate.  The femur was drilled bicortically.  A depth gauge was used to estimate the length of screw.  That length cortical screw was then inserted just distal to the fracture site.  Next, attention was turned to placing the screws proximal to the fracture.  To place the screws, the targeting guide was placed through the jig.  A stab skin incision was made and taken sharply down through the IT band.  The targeting guide was placed onto the plate and into one of the holes.  A drill was then used to drill the femur bicortically.  A depth gauge was used to estimate the screw length.  That length cortical screw was then placed around the existing nail.  All the screws that were placed were placed in  the off center holes to get around the nail.  A total of 5 screws were placed above the fracture.  The clamp was then removed.  A cable passer was placed a around the femur.  A cable was advanced through the cable passer.  The cable was then tightened down over the femur plate at the fracture site.  The screw associated with the cable locking device was tightened down.  The excess cable was then cut.   Final AP and lateral fluoroscopic images were then taken of the femur and knee showing satisfactory reduction and placement of the fixation.  The wounds were copiously irrigated with sterile saline.  Vancomycin  powder was placed into the wounds.  The IT band was closed with 0 vicryl. The deep dermal layer was closed with 2-0 vicryl. The skin was closed with staples. Dressings were applied. All counts were correct at the end of the case. Patient was transferred back to a hospital bed. The patient was awakened from anesthesia and brought back to the post-anesthesia care unit in stable condition.     Post-operative plan:  The patient will recover in the post-anesthesia care unit and then go to the floor on the medicine service. The patient will receive two post-operative doses of ancef .  She will get another dose of TXA.  She will be on ASA 81 mg twice daily for DVT prophylaxis.  The patient will be touchdown weightbearing on the right lower extremity. The patient will work with physical therapy. The patient's disposition will be determined by the medicine service.        Ozell Ada, MD Orthopedic Surgeon

## 2024-06-09 ENCOUNTER — Encounter (HOSPITAL_COMMUNITY): Payer: Self-pay | Admitting: Orthopedic Surgery

## 2024-06-09 LAB — CBC
HCT: 21.6 % — ABNORMAL LOW (ref 36.0–46.0)
Hemoglobin: 7.4 g/dL — ABNORMAL LOW (ref 12.0–15.0)
MCH: 33.9 pg (ref 26.0–34.0)
MCHC: 34.3 g/dL (ref 30.0–36.0)
MCV: 99.1 fL (ref 80.0–100.0)
Platelets: 112 K/uL — ABNORMAL LOW (ref 150–400)
RBC: 2.18 MIL/uL — ABNORMAL LOW (ref 3.87–5.11)
RDW: 14 % (ref 11.5–15.5)
WBC: 14.2 K/uL — ABNORMAL HIGH (ref 4.0–10.5)
nRBC: 0 % (ref 0.0–0.2)

## 2024-06-09 LAB — HEMOGLOBIN AND HEMATOCRIT, BLOOD
HCT: 26.9 % — ABNORMAL LOW (ref 36.0–46.0)
Hemoglobin: 9.3 g/dL — ABNORMAL LOW (ref 12.0–15.0)

## 2024-06-09 LAB — BASIC METABOLIC PANEL WITH GFR
Anion gap: 4 — ABNORMAL LOW (ref 5–15)
BUN: 37 mg/dL — ABNORMAL HIGH (ref 8–23)
CO2: 27 mmol/L (ref 22–32)
Calcium: 8.3 mg/dL — ABNORMAL LOW (ref 8.9–10.3)
Chloride: 107 mmol/L (ref 98–111)
Creatinine, Ser: 0.85 mg/dL (ref 0.44–1.00)
GFR, Estimated: >60 mL/min (ref >60)
Glucose, Bld: 95 mg/dL (ref 70–99)
Potassium: 4.1 mmol/L (ref 3.5–5.1)
Sodium: 138 mmol/L (ref 135–145)

## 2024-06-09 LAB — PREPARE RBC (CROSSMATCH)

## 2024-06-09 MED ORDER — SODIUM CHLORIDE 0.9% IV SOLUTION: Freq: Once | INTRAVENOUS | Status: DC

## 2024-06-09 MED ORDER — ORAL CARE MOUTH RINSE: 15.0000 mL | OROMUCOSAL | Status: DC | PRN

## 2024-06-09 NOTE — NC FL2 (Signed)
 Tanya Roy  MEDICAID FL2 LEVEL OF CARE FORM     IDENTIFICATION  Patient Name: Tanya Roy Birthdate: July 27, 1928 Sex: female Admission Date (Current Location): 06/07/2024  [facility] and [place] Number:  Producer, Television/film/video and Address:  [facility] [place], [address] [place], [place] [address]      Provider Number: [id number]  Attending Physician Name and Address:  Juvenal Harlene PENNER, DO  Relative Name and Phone Number:  Tanya Roy (Sister)  [phone]    Current Level of Care: Hospital Recommended Level of Care: Skilled Nursing Facility Prior Approval Number:    Date Approved/Denied:   PASRR Number: [id number]  Discharge Plan: SNF    Current Diagnoses: Patient Active Problem List   Diagnosis Date Noted   Closed displaced spiral fracture of shaft of right femur (HCC) 06/07/2024   Right femoral fracture (HCC) 06/07/2024   History of ST elevation myocardial infarction (STEMI) 03/15/2021   Chronic HFrEF (heart failure with reduced ejection fraction) (HCC) 03/15/2021   CAD in native artery 03/15/2021   Generalized weakness 03/15/2021   CAD S/P percutaneous coronary angioplasty 03/14/2021   Dilated cardiomyopathy (HCC) 02/17/2021   Essential hypertension 02/17/2021   Hyperlipidemia 02/17/2021   Delirium 02/17/2021   Acute HFrEF (heart failure with reduced ejection fraction) (HCC)    STEMI (ST elevation myocardial infarction) (HCC) 02/14/2021   Hip fracture (HCC) 08/29/2016   Melanoma of skin (HCC) 03/24/2015   OP (osteoporosis) 03/04/2015   Arthritis, degenerative 02/24/2014    Orientation RESPIRATION BLADDER Height & Weight     Self  Normal Continent Weight:   Height:     BEHAVIORAL SYMPTOMS/MOOD NEUROLOGICAL BOWEL NUTRITION STATUS      Continent Diet (See DC Summary)  AMBULATORY STATUS COMMUNICATION OF NEEDS Skin   Extensive Assist   Other (Comment) (Closed surgical incision on right thigh)                       Personal Care  Assistance Level of Assistance  Bathing, Feeding, Dressing Bathing Assistance: Maximum assistance Feeding assistance: Limited assistance Dressing Assistance: Maximum assistance     Functional Limitations Info  Sight, Hearing, Speech Sight Info: Adequate Hearing Info: Adequate Speech Info: Adequate    SPECIAL CARE FACTORS FREQUENCY  PT (By licensed PT), OT (By licensed OT)     PT Frequency: 5x/week OT Frequency: 5x/week            Contractures Contractures Info: Not present    Additional Factors Info  Code Status, Allergies Code Status Info: DNR-Limited Allergies Info: No known allergies           Current Medications (06/09/2024):  This is the current hospital active medication list Current Facility-Administered Medications  Medication Dose Route Frequency Provider Last Rate Last Admin   0.9 %  sodium chloride  infusion (Manually program via Guardrails IV Fluids)   Intravenous Once Vann, Jessica U, DO       acetaminophen  (TYLENOL ) tablet 1,000 mg  1,000 mg Oral Q8H Moore, Michael A, MD   1,000 mg at 06/09/24 9383   aspirin  chewable tablet 81 mg  81 mg Oral BID Moore, Michael A, MD   81 mg at 06/09/24 1004   feeding supplement (ENSURE SURGERY) liquid 237 mL  237 mL Oral BID BM Moore, Michael A, MD   237 mL at 06/08/24 1411   mirtazapine  (REMERON ) tablet 7.5 mg  7.5 mg Oral QHS Vann, Jessica U, DO   7.5 mg at 06/08/24 2036  ondansetron  (ZOFRAN ) tablet 4 mg  4 mg Oral Q6H PRN Pahwani, Rinka R, MD       Or   ondansetron  (ZOFRAN ) injection 4 mg  4 mg Intravenous Q6H PRN Pahwani, Rinka R, MD       oxyCODONE  (Oxy IR/ROXICODONE ) immediate release tablet 2.5-5 mg  2.5-5 mg Oral Q4H PRN Moore, Michael A, MD       polyethylene glycol (MIRALAX  / GLYCOLAX ) packet 17 g  17 g Oral Daily Vann, Jessica U, DO   17 g at 06/09/24 1004     Discharge Medications: Please see discharge summary for a list of discharge medications.  Relevant Imaging Results:  Relevant Lab  Results:   Additional Information SSN: [id number]  Jeoffrey LITTIE Moose, LCSWA

## 2024-06-09 NOTE — TOC Initial Note (Signed)
 Transition of Care [facility]) - Initial/Assessment Note    Patient Details  Name: Tanya Roy MRN: [id number] Date of Birth: 10/29/1928  [facility] CM/SW Contact:    Jeoffrey LITTIE [name] Phone Number: 06/09/2024, 10:08 AM  Clinical Narrative:                 Pt admitted from [facility] due to mechanical fall. Pt currently Ox1- CSW contacted pt sister, Lindajo, and completed SNF workup over the phone. Lindajo is agreeable to SNF following hospital DC and stated [facility] was her first facility choice. CSW completed Fl2 and sent out SNF referrals. CSW will follow up to provide bed offers.  Expected Discharge Plan: Skilled Nursing Facility Barriers to Discharge: Continued Medical Work up, SNF Pending bed offer   Patient Goals and CMS Choice Patient states their goals for this hospitalization and ongoing recovery are:: SNF          Expected Discharge Plan and Services       Living arrangements for the past 2 months: Assisted Living Facility                                      Prior Living Arrangements/Services Living arrangements for the past 2 months: Assisted Living Facility Lives with:: Facility Resident Patient language and need for interpreter reviewed:: Yes Do you feel safe going back to the place where you live?: Yes      Need for Family Participation in Patient Care: Yes (Comment) Care giver support system in place?: Yes (comment)   Criminal Activity/Legal Involvement Pertinent to Current Situation/Hospitalization: No - Comment as needed  Activities of Daily Living      Permission Sought/Granted Permission sought to share information with : Facility Medical Sales Representative, Family Supports    Share Information with NAME: Lindajo  Permission granted to share info w AGENCY: SNFs  Permission granted to share info w Relationship: Sister  Permission granted to share info w Contact Information: [phone]  Emotional  Assessment Appearance:: Appears stated age Attitude/Demeanor/Rapport: Unable to Assess Affect (typically observed): Unable to Assess Orientation: : Oriented to Self Alcohol / Substance Use: Not Applicable Psych Involvement: No (comment)  Admission diagnosis:  Right femoral fracture (HCC) [S72.91XA] Patient Active Problem List   Diagnosis Date Noted   Closed displaced spiral fracture of shaft of right femur (HCC) 06/07/2024   Right femoral fracture (HCC) 06/07/2024   History of ST elevation myocardial infarction (STEMI) 03/15/2021   Chronic HFrEF (heart failure with reduced ejection fraction) (HCC) 03/15/2021   CAD in native artery 03/15/2021   Generalized weakness 03/15/2021   CAD S/P percutaneous coronary angioplasty 03/14/2021   Dilated cardiomyopathy (HCC) 02/17/2021   Essential hypertension 02/17/2021   Hyperlipidemia 02/17/2021   Delirium 02/17/2021   Acute HFrEF (heart failure with reduced ejection fraction) (HCC)    STEMI (ST elevation myocardial infarction) (HCC) 02/14/2021   Hip fracture (HCC) 08/29/2016   Melanoma of skin (HCC) 03/24/2015   OP (osteoporosis) 03/04/2015   Arthritis, degenerative 02/24/2014   PCP:  Diedra Lame, MD Pharmacy:   [facility] [employer] - [address] [place] [address] Phone: [phone] Fax: [phone]  [facility] - [place], [place] - [address] [place] [address] Phone: [phone] Fax: [phone]     Social Drivers of Health (SDOH) Social History:  SDOH Screenings   Food Insecurity: No Food Insecurity (06/08/2024)  Housing: Low Risk (06/08/2024)  Transportation Needs: Patient Declined (06/08/2024)  Utilities: Not At Risk (06/08/2024)  Social Connections: Unknown (06/08/2024)  Tobacco Use: Low Risk (06/07/2024)   SDOH Interventions:     Readmission Risk Interventions     No data to display

## 2024-06-09 NOTE — Progress Notes (Signed)
 Sister Lindajo Piety signed consent for pt blood. Blood consent signed and placed in pt chart.

## 2024-06-09 NOTE — Evaluation (Signed)
 Occupational Therapy Evaluation Patient Details Name: Tanya Roy MRN: [id number] DOB: 14-Aug-1928 Today's Date: 06/09/2024   History of Present Illness   89 y.o. female presents to [facility] 06/07/24 from ALF after falling. Pt with R periprosthetic femur fx s/p ORIF on 12/13. Also with acute hypoxemic respiratory failure likely 2/2 anesthesia. PMHx:  hypertension, hyperlipidemia, CAD, CHF, osteoporosis, dementia, MI     Clinical Impressions PTA Pt resided at [facility] where she utilized RW for short distances with supervision and received assistance for ADLs. Pt currently requires up to Mod A +2 for functional mobility and up to total A for ADL engagement. Pt is primarily limited by decreased activity tolerance, generalized weakness, unsteadiness on feet, and decreased knowledge of precautions. OT to continue to follow Pt acutely to facilitate progress towards goals. Patient will benefit from continued inpatient follow up therapy, <3 hours/day.      If plan is discharge home, recommend the following:   Two people to help with walking and/or transfers;A lot of help with bathing/dressing/bathroom;Assistance with cooking/housework;Direct supervision/assist for medications management;Direct supervision/assist for financial management;Assist for transportation;Help with stairs or ramp for entrance;Supervision due to cognitive status     Functional Status Assessment   Patient has had a recent decline in their functional status and demonstrates the ability to make significant improvements in function in a reasonable and predictable amount of time.     Equipment Recommendations   Wheelchair (measurements OT);Wheelchair cushion (measurements OT)     Recommendations for Other Services         Precautions/Restrictions   Precautions Precautions: Fall Recall of Precautions/Restrictions: Impaired Restrictions Weight Bearing Restrictions Per Provider Order: Yes RLE Weight  Bearing Per Provider Order: Touchdown weight bearing     Mobility Bed Mobility Overal bed mobility: Needs Assistance Bed Mobility: Supine to Sit, Sit to Supine     Supine to sit: Min assist, HOB elevated, Used rails Sit to supine: Min assist   General bed mobility comments: Min A to come to EOB. Assist needed for management of RLE and HHA to facilitate full trunk elevation off of bed. Increased time and effort to scoot towards EOB. Min A to return to supine and reposition. Dense verbal cues for sequencing required.    Transfers Overall transfer level: Needs assistance Equipment used: Rolling walker (2 wheels) Transfers: Sit to/from Stand Sit to Stand: Mod assist, +2 physical assistance, +2 safety/equipment, From elevated surface           General transfer comment: Pt required Mod A +2 to stand from bed slightly elevated. Pt required dense multimodal cues to adhere to TDWB precautions. Pt unable to maintain precautions without physical assistance from therapist. Strong posterior lean in standing this date and pushing RW away from bed while flexing trunk. Pt unable to maintain grasp on RW with RUE despite hand-over-hand placement. Pt returned to supine in bed for posterior peri care.      Balance Overall balance assessment: Needs assistance Sitting-balance support: Bilateral upper extremity supported, Feet supported Sitting balance-Leahy Scale: Fair   Postural control: Posterior lean Standing balance support: Bilateral upper extremity supported, During functional activity, Reliant on assistive device for balance Standing balance-Leahy Scale: Poor Standing balance comment: Dependent on RW and external support                           ADL either performed or assessed with clinical judgement   ADL Overall ADL's : Needs assistance/impaired Eating/Feeding: Independent  Grooming: Supervision/safety;Sitting   Upper Body Bathing: Minimal assistance;Sitting   Lower  Body Bathing: Maximal assistance   Upper Body Dressing : Minimal assistance   Lower Body Dressing: Maximal assistance   Toilet Transfer: Moderate assistance;+2 for safety/equipment;+2 for physical assistance   Toileting- Clothing Manipulation and Hygiene: Total assistance               Vision Patient Visual Report: No change from baseline Vision Assessment?: No apparent visual deficits     Perception         Praxis         Pertinent Vitals/Pain Pain Assessment Pain Assessment: No/denies pain     Extremity/Trunk Assessment Upper Extremity Assessment Upper Extremity Assessment: Generalized weakness   Lower Extremity Assessment Lower Extremity Assessment: Defer to PT evaluation   Cervical / Trunk Assessment Cervical / Trunk Assessment: Kyphotic   Communication Communication Communication: No apparent difficulties   Cognition Arousal: Alert Behavior During Therapy: Anxious Cognition: History of cognitive impairments             OT - Cognition Comments: baseline dementia                 Following commands: Impaired Following commands impaired: Follows one step commands inconsistently, Follows one step commands with increased time     Cueing  General Comments   Cueing Techniques: Verbal cues;Tactile cues;Visual cues  Pt found soiled with bowel movement in bed. Posterior peri care completed in bed. Bed pad changed and purewick removed as it was soiled. Primary nurse notified.   Exercises     Shoulder Instructions      Home Living Family/patient expects to be discharged to:: Skilled nursing facility                                 Additional Comments: Home Place in [place]      Prior Functioning/Environment Prior Level of Function : Needs assist;History of Falls (last six months)             Mobility Comments: Per chart review, staff close by when ambulating with RW for short distances ADLs Comments: Per chart  review, assist for dressing, bathing. Goes to dining hall for meals    OT Problem List: Decreased strength;Decreased activity tolerance;Impaired balance (sitting and/or standing);Decreased safety awareness;Decreased knowledge of use of DME or AE;Decreased knowledge of precautions   OT Treatment/Interventions: Self-care/ADL training;Therapeutic exercise;Energy conservation;DME and/or AE instruction;Therapeutic activities;Patient/family education;Balance training      OT Goals(Current goals can be found in the care plan section)   Acute Rehab OT Goals Patient Stated Goal: to get better OT Goal Formulation: With patient Time For Goal Achievement: 06/23/24 Potential to Achieve Goals: Good ADL Goals Pt Will Perform Lower Body Dressing: with min assist;sitting/lateral leans Pt Will Transfer to Toilet: with contact guard assist;with transfer board;bedside commode Pt Will Perform Toileting - Clothing Manipulation and hygiene: with min assist;sitting/lateral leans Pt/caregiver will Perform Home Exercise Program: Increased strength;Both right and left upper extremity;With written HEP provided   OT Frequency:  Min 2X/week    Co-evaluation              AM-PAC OT 6 Clicks Daily Activity     Outcome Measure Help from another person eating meals?: None Help from another person taking care of personal grooming?: A Little Help from another person toileting, which includes using toliet, bedpan, or urinal?: Total Help from another person bathing (including washing,  rinsing, drying)?: A Lot Help from another person to put on and taking off regular upper body clothing?: A Little Help from another person to put on and taking off regular lower body clothing?: A Lot 6 Click Score: 15   End of Session Equipment Utilized During Treatment: Gait belt;Rolling walker (2 wheels) Nurse Communication: Mobility status;Other (comment) (Need for purewick)  Activity Tolerance: Patient tolerated treatment  well Patient left: in bed;with call bell/phone within reach;with bed alarm set  OT Visit Diagnosis: Unsteadiness on feet (R26.81);Muscle weakness (generalized) (M62.81);History of falling (Z91.81)                Time: 9084-9067 OT Time Calculation (min): 17 min Charges:  OT General Charges $OT Visit: 1 Visit OT Evaluation $OT Eval Low Complexity: 1 Low  Maurilio CROME, OTR/L.  [facility] Acute Rehabilitation  Office: [phone]   Maurilio PARAS Millianna Szymborski 06/09/2024, 9:53 AM

## 2024-06-09 NOTE — Plan of Care (Signed)
   Problem: Safety: Goal: Ability to remain free from injury will improve Outcome: Progressing

## 2024-06-09 NOTE — Progress Notes (Signed)
 PROGRESS NOTE    Tanya Roy  FMW:[id number] DOB: [birth year] DOA: 06/07/2024 PCP: Diedra Lame, MD    Brief Narrative:  Tanya Roy is a 89 y.o. female with medical history significant of hypertension, hyperlipidemia, CAD, CHF, osteoporosis, dementia, prior history of intertrochanteric femur fracture that underwent cephalomedullary rodding in 2018 presented after a witnessed mechanical fall at the nursing facility prior to arrival.  Hospital stay complicated by anemia, patient getting 1 unit PRBCs on 12/15   Assessment and Plan: Right periprosthetic femur fracture: - Status post open reduction and internal fixation of distal femur fracture on 06/07/2024. - Monitor vitals closely and H&H closely-transfuse 1 unit PRBC on 12/15 -PT/OT eval    Status post fall: - CT head/cervical spine negative for any acute findings   Acute hypoxemic respiratory failure: - Weaned to room air   CAD status post PCI 02/15/2021 - On aspirin , statin, Coreg , Entresto , nitro as needed and Jardiance . - hold her home medications at this time since her BP is soft   HFrEF: - Euvolemic on exam.  Monitor signs for fluid overload. - Will hold Entresto , Jardiance , statin, Coreg  for now    Hyperlipidemia: - Hold statin   Leukocytosis: Likely reactive. - Afebrile    Dementia: - At baseline -Delirium precautions   Acute blood loss anemia status post procedure - Baseline hemoglobin between 13-14.   -Recheck in the a.m., transfuse for less than 8  DVT prophylaxis: SCDs Start: 06/07/24 1901    Code Status: Limited: Do not attempt resuscitation (DNR) -DNR-LIMITED -Do Not Intubate/DNI  Updated niece on phone, agreeable to blood transfusion  Disposition Plan:  Level of care: Telemetry Status is: Inpatient     Consultants:  Orthopedics    Subjective: Denies pain  Objective: Vitals:   06/08/24 1729 06/08/24 2106 06/09/24 0512 06/09/24 0951  BP: (!) 148/68 (!)  112/55 135/78 (!) 101/47  Pulse: 92 71 77 63  Resp: 18   16  Temp:  (!) 97.5 F (36.4 C) 97.7 F (36.5 C)   TempSrc:  Oral Oral   SpO2: 97% 98% 97% 100%    Intake/Output Summary (Last 24 hours) at 06/09/2024 1027 Last data filed at 06/09/2024 0700 Gross per 24 hour  Intake 480 ml  Output 100 ml  Net 380 ml   There were no vitals filed for this visit.  Examination:   General: Appearance:    Thin pale female in no acute distress     Lungs:     respirations unlabored  Heart:    Normal heart rate   MS:   All extremities are intact.    Neurologic:   Awake, alert       Data Reviewed: I have personally reviewed following labs and imaging studies  CBC: Recent Labs  Lab 06/06/24 2151 06/08/24 0233 06/08/24 0811 06/09/24 0523  WBC 11.3* 17.9* 17.0* 14.2*  NEUTROABS 8.8*  --   --   --   HGB 11.7* 8.6* 7.8* 7.4*  HCT 34.9* 25.7* 23.1* 21.6*  MCV 100.3* 100.8* 100.9* 99.1  PLT 160 102* 97* 112*   Basic Metabolic Panel: Recent Labs  Lab 06/06/24 2151 06/08/24 0233 06/09/24 0523  NA 140 137 138  K 4.2 4.2 4.1  CL 106 104 107  CO2 28 25 27   GLUCOSE 130* 158* 95  BUN 29* 30* 37*  CREATININE 0.84 0.71 0.85  CALCIUM  9.0 8.2* 8.3*   GFR: CrCl cannot be calculated (Unknown ideal weight.). Liver Function Tests: No results for input(s):  AST, ALT, ALKPHOS, BILITOT, PROT, ALBUMIN  in the last 168 hours. No results for input(s): LIPASE, AMYLASE in the last 168 hours. No results for input(s): AMMONIA in the last 168 hours. Coagulation Profile: No results for input(s): INR, PROTIME in the last 168 hours. Cardiac Enzymes: No results for input(s): CKTOTAL, CKMB, CKMBINDEX, TROPONINI in the last 168 hours. BNP (last 3 results) No results for input(s): PROBNP in the last 8760 hours. HbA1C: No results for input(s): HGBA1C in the last 72 hours. CBG: No results for input(s): GLUCAP in the last 168 hours. Lipid Profile: No results for  input(s): CHOL, HDL, LDLCALC, TRIG, CHOLHDL, LDLDIRECT in the last 72 hours. Thyroid Function Tests: No results for input(s): TSH, T4TOTAL, FREET4, T3FREE, THYROIDAB in the last 72 hours. Anemia Panel: Recent Labs    06/08/24 0233  VITAMINB12 1,139*  FERRITIN 94  TIBC 183*  IRON 20*   Sepsis Labs: No results for input(s): PROCALCITON, LATICACIDVEN in the last 168 hours.  No results found for this or any previous visit (from the past 240 hours).       Radiology Studies: DG FEMUR, MIN 2 VIEWS RIGHT Result Date: 06/07/2024 CLINICAL DATA:  [id number] Surgery, elective [id number] EXAM: RIGHT FEMUR 2 VIEWS COMPARISON:  June 06, 2024 FINDINGS: Spot fluoroscopy images were obtained for surgical planning purposes. This demonstrates placement of an additional screw and plate fixation along the distal femur with corresponding improved alignment of the distal femoral fracture. Status post intramedullary rod fixation of the RIGHT femur. Time: 120 second Dose: 9.42 mGy Please reference procedure report for further details. IMPRESSION: Spot fluoroscopy images obtained for surgical planning purposes. Electronically Signed   By: Corean Salter M.D.   On: 06/07/2024 18:11   DG C-Arm 1-60 Min-No Report Result Date: 06/07/2024 Fluoroscopy was utilized by the requesting physician.  No radiographic interpretation.   DG C-Arm 1-60 Min-No Report Result Date: 06/07/2024 Fluoroscopy was utilized by the requesting physician.  No radiographic interpretation.   DG C-Arm 1-60 Min-No Report Result Date: 06/07/2024 Fluoroscopy was utilized by the requesting physician.  No radiographic interpretation.        Scheduled Meds:  sodium chloride    Intravenous Once   acetaminophen   1,000 mg Oral Q8H   aspirin   81 mg Oral BID   feeding supplement  237 mL Oral BID BM   mirtazapine   7.5 mg Oral QHS   polyethylene glycol  17 g Oral Daily   Continuous Infusions:     LOS: 2 days     Time spent: 45 minutes spent on chart review, discussion with nursing staff, consultants, updating family and interview/physical exam; more than 50% of that time was spent in counseling and/or coordination of care.    Harlene RAYMOND Bowl, DO [facility] Available via Epic secure chat 7am-7pm After these hours, please refer to coverage provider listed on [email or web address] 06/09/2024, 10:27 AM

## 2024-06-09 NOTE — Progress Notes (Signed)
 Post blood H&H placed for 1941

## 2024-06-10 LAB — BASIC METABOLIC PANEL WITH GFR
Anion gap: 4 — ABNORMAL LOW (ref 5–15)
BUN: 32 mg/dL — ABNORMAL HIGH (ref 8–23)
CO2: 28 mmol/L (ref 22–32)
Calcium: 8.3 mg/dL — ABNORMAL LOW (ref 8.9–10.3)
Chloride: 108 mmol/L (ref 98–111)
Creatinine, Ser: 0.69 mg/dL (ref 0.44–1.00)
GFR, Estimated: >60 mL/min (ref >60)
Glucose, Bld: 94 mg/dL (ref 70–99)
Potassium: 4 mmol/L (ref 3.5–5.1)
Sodium: 140 mmol/L (ref 135–145)

## 2024-06-10 LAB — TYPE AND SCREEN
ABO/RH(D): O POS
Antibody Screen: NEGATIVE
Unit division: 0

## 2024-06-10 LAB — CBC
HCT: 29.3 % — ABNORMAL LOW (ref 36.0–46.0)
Hemoglobin: 10 g/dL — ABNORMAL LOW (ref 12.0–15.0)
MCH: 33 pg (ref 26.0–34.0)
MCHC: 34.1 g/dL (ref 30.0–36.0)
MCV: 96.7 fL (ref 80.0–100.0)
Platelets: 118 K/uL — ABNORMAL LOW (ref 150–400)
RBC: 3.03 MIL/uL — ABNORMAL LOW (ref 3.87–5.11)
RDW: 17.4 % — ABNORMAL HIGH (ref 11.5–15.5)
WBC: 12.8 K/uL — ABNORMAL HIGH (ref 4.0–10.5)
nRBC: 0 % (ref 0.0–0.2)

## 2024-06-10 LAB — BPAM RBC
Blood Product Expiration Date: 202601112359
ISSUE DATE / TIME: 202512151423
Unit Type and Rh: 5100

## 2024-06-10 NOTE — Progress Notes (Signed)
°   06/10/24 0750  Vitals  BP 134/74  BP Location Left Arm  BP Method Automatic  Patient Position (if appropriate) Sitting  Pulse Rate 71  Pulse Rate Source Dinamap  Resp 17  MEWS COLOR  MEWS Score Color Green  Oxygen Therapy  SpO2 97 %  O2 Device Room Air  Height and Weight  Height 5' 6 (1.676 m)  Weight 51.3 kg  BSA (Calculated - sq m) 1.55 sq meters  BMI (Calculated) 18.26  Weight in (lb) to have BMI = 25 154.6  MEWS Score  MEWS Temp 0  MEWS Systolic 0  MEWS Pulse 0  MEWS RR 0  MEWS LOC 0  MEWS Score 0

## 2024-06-10 NOTE — TOC Progression Note (Addendum)
 Transition of Care [facility]) - Progression Note    Patient Details  Name: Tanya Roy MRN: [id number] Date of Birth: August 24, 1928  [facility] [address] CM/SW Contact  Daviyon Widmayer LITTIE Moose, [place] Phone Number: 06/10/2024, 12:53 PM  Clinical Narrative:    Pt currently Ox[facility]- CSW met with pt, pt niece Delsie), and pt sister Dimple) at bedside and provided SNF bed offers. [facility] stated they would like to go with [facility]. CSW reached out to facility to confirm bed availability and is awaiting response. CSW will continue to follow.  2:52 PM- CSW received a message from [facility] confirming bed availability, pt can admit when medically ready for DC.   Expected Discharge Plan: Skilled Nursing Facility Barriers to Discharge: Continued Medical Work up, SNF Pending bed offer               Expected Discharge Plan and Services       Living arrangements for the past 2 months: Assisted Living Facility                                       Social Drivers of Health (SDOH) Interventions SDOH Screenings   Food Insecurity: No Food Insecurity (06/08/2024)  Housing: Low Risk (06/08/2024)  Transportation Needs: Patient Declined (06/08/2024)  Utilities: Not At Risk (06/08/2024)  Social Connections: Unknown (06/08/2024)  Tobacco Use: Low Risk (06/07/2024)    Readmission Risk Interventions     No data to display

## 2024-06-10 NOTE — Plan of Care (Signed)

## 2024-06-10 NOTE — Care Management Important Message (Signed)
 Important Message  Patient Details  Name: Tanya Roy MRN: [id number] Date of Birth: [birth year]   Important Message Given:  Yes - Medicare IM     [name] 06/10/2024, 12:38 PM

## 2024-06-10 NOTE — Progress Notes (Signed)
 Physical Therapy Treatment Patient Details Name: Tanya Roy MRN: [id number] DOB: Sep 22, 1928 Today's Date: 06/10/2024   History of Present Illness 89 y.o. female presents to [facility] 06/07/24 from ALF after falling. Pt with R periprosthetic femur fx s/p ORIF on 12/13 now RLE TDWB. Also with acute hypoxemic respiratory failure likely 2/2 anesthesia. PMHx:  hypertension, hyperlipidemia, CAD, CHF, osteoporosis, dementia, MI    PT Comments  Pt received in supine, alert and confused, unable to recall location/situation or her RLE TDWB precs despite frequent reminders, pt given large letter handout taped to end of bed reminding her on call bell use, location/situation and that she can urinate via purewick in bed, as pt often c/o discomfort and asking to urinate. Pt needing up to modA for transfers, able to stand upright to RW today but safer when standing to [place]. Board updated with guidance for staff to use Stedy for OOB transfers and to include RLE TWB precs. Patient will benefit from continued inpatient follow up therapy, <3 hours/day.    If plan is discharge home, recommend the following: A lot of help with bathing/dressing/bathroom;Help with stairs or ramp for entrance;Assist for transportation;Two people to help with walking and/or transfers;Supervision due to cognitive status;Assistance with cooking/housework;Assistance with feeding;Direct supervision/assist for medications management;Direct supervision/assist for financial management   Can travel by private vehicle     No  Equipment Recommendations  Wheelchair (measurements PT);Wheelchair cushion (measurements PT);BSC/3in1 (drop arm BSC/WC, may need hoyer pending progress)    Recommendations for Other Services       Precautions / Restrictions Precautions Precautions: Fall Recall of Precautions/Restrictions: Impaired Precaution/Restrictions Comments: poor recall of precs, location, situation and use of call  bell Restrictions Weight Bearing Restrictions Per Provider Order: Yes RLE Weight Bearing Per Provider Order: Touchdown weight bearing Other Position/Activity Restrictions: needs reminders, fair carryover while standing today     Mobility  Bed Mobility Overal bed mobility: Needs Assistance Bed Mobility: Supine to Sit, Sit to Supine     Supine to sit: Min assist, HOB elevated, Used rails Sit to supine: Min assist   General bed mobility comments: Min A to come to EOB. Assist needed for management of RLE and HHA to facilitate full trunk elevation off of bed. Min A to return to supine and reposition with BLE assist. Dense verbal cues for sequencing required.    Transfers Overall transfer level: Needs assistance Equipment used: Rolling walker (2 wheels), Ambulation equipment used Transfers: Sit to/from Stand Sit to Stand: Mod assist, +2 physical assistance, From elevated surface, Min assist, Via lift equipment           General transfer comment: Pt required modA to stand from bed slightly elevated to [place]. Pt required dense multimodal cues to adhere to TDWB precautions, able to stand from elevated Stedy flaps with minA after seated rest break and scooting higher toward HOB in [place]. Pt then stood from EOB<>RW with modA +1 and able to maintain RLE TDWB while standing upright in RW this date. Pt then c/o fatigue and needing to urinate, so assisted to return to supine so she can use purewick.    Ambulation/Gait             Pre-gait activities: x1 small shuffled step/hop toward her L side with RW and +1 modA, pt with difficulty with RLE TDWB so unable to progress gait this date.     Stairs             Wheelchair Mobility     Tilt Bed  Modified Rankin (Stroke Patients Only)       Balance Overall balance assessment: Needs assistance Sitting-balance support: Bilateral upper extremity supported, Feet supported Sitting balance-Leahy Scale: Fair Sitting balance -  Comments: EOB   Standing balance support: Bilateral upper extremity supported, During functional activity, Reliant on assistive device for balance Standing balance-Leahy Scale: Poor Standing balance comment: Dependent on RW and external support                            Communication Communication Communication: No apparent difficulties  Cognition Arousal: Alert Behavior During Therapy: Anxious                             Following commands: Impaired Following commands impaired: Follows one step commands inconsistently, Follows one step commands with increased time    Cueing Cueing Techniques: Verbal cues, Tactile cues, Visual cues  Exercises General Exercises - Lower Extremity Ankle Circles/Pumps: AROM, Both, 5 reps, Supine Heel Slides: AROM, Right, Supine, AAROM, 5 reps    General Comments General comments (skin integrity, edema, etc.): Pt found soiled with bowel movement in bed. Posterior peri care completed in bed. Bed pad changed and purewick removed as it was soiled. New purewick placed as well as briefs/pad to ensure purewick stays in place while pt standing. Pt given paper taped to end of her bed that states Ms. Lib, you are at [facility]. You had surgery on your right hip. You have a urinary catheter so you can urinate in the bed when you need to. Press the call bell when you need any help as pt frequently asking PTA during session where she is, asking to urinate and is unable to recall RLE surgery.      Pertinent Vitals/Pain Pain Assessment Pain Assessment: PAINAD Breathing: normal    Home Living                          Prior Function            PT Goals (current goals can now be found in the care plan section) Acute Rehab PT Goals Patient Stated Goal: to go home PT Goal Formulation: With patient/family Time For Goal Achievement: 06/22/24 Progress towards PT goals: Progressing toward goals    Frequency    Min  2X/week      PT Plan      Co-evaluation              AM-PAC PT 6 Clicks Mobility   Outcome Measure  Help needed turning from your back to your side while in a flat bed without using bedrails?: A Little Help needed moving from lying on your back to sitting on the side of a flat bed without using bedrails?: A Lot (w/o rail) Help needed moving to and from a bed to a chair (including a wheelchair)?: A Lot Help needed standing up from a chair using your arms (e.g., wheelchair or bedside chair)?: A Lot Help needed to walk in hospital room?: Total Help needed climbing 3-5 steps with a railing? : Total 6 Click Score: 11    End of Session Equipment Utilized During Treatment: Gait belt Activity Tolerance: Patient tolerated treatment well Patient left: in bed;with call bell/phone within reach;with bed alarm set;Other (comment);with SCD's reapplied (bed in chair posture with legs straight, pt set up to eat her dinner) Nurse Communication: Mobility status;Need  for lift equipment;Precautions;Other (comment) (Board updated: use +2 and Stedy for OOB, pt RLE TDWB) PT Visit Diagnosis: Unsteadiness on feet (R26.81);Other abnormalities of gait and mobility (R26.89);Muscle weakness (generalized) (M62.81);History of falling (Z91.81)     Time: 8368-8282 PT Time Calculation (min) (ACUTE ONLY): 46 min  Charges:    $Therapeutic Activity: 38-52 mins PT General Charges $$ ACUTE PT VISIT: 1 Visit                     Camylle Whicker P., PTA Acute Rehabilitation Services Secure Chat Preferred 9a-5:30pm Office: [phone]    Connell HERO [facility] 06/10/2024, 6:25 PM

## 2024-06-10 NOTE — Progress Notes (Signed)
 PROGRESS NOTE    Fayrene Towner  FMW:[id number] DOB: [birth year] DOA: 06/07/2024 PCP: Diedra Lame, MD    Brief Narrative:  Geniece Akers is a 89 y.o. female with medical history significant of hypertension, hyperlipidemia, CAD, CHF, osteoporosis, dementia, prior history of intertrochanteric femur fracture that underwent cephalomedullary rodding in 2018 presented after a witnessed mechanical fall at the nursing facility prior to arrival.  Hospital stay complicated by anemia, patient getting 1 unit PRBCs on 12/15   Assessment and Plan: Right periprosthetic femur fracture: - Status post open reduction and internal fixation of distal femur fracture on 06/07/2024. - Monitor vitals closely and H&H closely-transfuse 1 unit PRBC on 12/15 -PT/OT eval-SNF    Status post fall: - CT head/cervical spine negative for any acute findings   Acute hypoxemic respiratory failure: - Weaned to room air   CAD status post PCI 02/15/2021 - On aspirin , statin, Coreg , Entresto , nitro as needed and Jardiance . - Resume medications over the next few days   HFrEF: - Euvolemic on exam.  Monitor signs for fluid overload. - Will hold Entresto , Jardiance , statin, Coreg  for now    Hyperlipidemia: - Hold statin   Leukocytosis: Likely reactive. - Afebrile    Dementia: - At baseline -Delirium precautions   Acute blood loss anemia status post procedure - Baseline hemoglobin between 13-14.   - Status post 1 unit PRBC on 12/15 with good results  DVT prophylaxis: SCDs Start: 06/07/24 1901    Code Status: Limited: Do not attempt resuscitation (DNR) -DNR-LIMITED -Do Not Intubate/DNI  Niece at bedside  Disposition Plan:  Level of care: Telemetry Status is: Inpatient     Consultants:  Orthopedics    Subjective: Visiting with her niece at bedside, no complaints  Objective: Vitals:   06/09/24 2008 06/10/24 0559 06/10/24 0727 06/10/24 0750  BP: 136/65 (!) 144/89  134/74   Pulse: 73 67  71  Resp: 16 17  17   Temp: (!) 97.5 F (36.4 C) (!) 97.5 F (36.4 C)    TempSrc: Oral Oral    SpO2: 97% 98%  97%  Weight:    51.3 kg  Height:   5' 6 (1.676 m) 5' 6 (1.676 m)    Intake/Output Summary (Last 24 hours) at 06/10/2024 1052 Last data filed at 06/10/2024 0750 Gross per 24 hour  Intake 1031.25 ml  Output --  Net 1031.25 ml   Filed Weights   06/10/24 0750  Weight: 51.3 kg    Examination:   General: Appearance:  Elderly female in no acute distress     Lungs:     respirations unlabored  Heart:    Normal heart rate   MS:   All extremities are intact.    Neurologic:   Awake, alert-pleasant and cooperative       Data Reviewed: I have personally reviewed following labs and imaging studies  CBC: Recent Labs  Lab 06/06/24 2151 06/08/24 0233 06/08/24 0811 06/09/24 0523 06/09/24 2034 06/10/24 0554  WBC 11.3* 17.9* 17.0* 14.2*  --  12.8*  NEUTROABS 8.8*  --   --   --   --   --   HGB 11.7* 8.6* 7.8* 7.4* 9.3* 10.0*  HCT 34.9* 25.7* 23.1* 21.6* 26.9* 29.3*  MCV 100.3* 100.8* 100.9* 99.1  --  96.7  PLT 160 102* 97* 112*  --  118*   Basic Metabolic Panel: Recent Labs  Lab 06/06/24 2151 06/08/24 0233 06/09/24 0523 06/10/24 0554  NA 140 137 138 140  K 4.2 4.2 4.1  4.0  CL 106 104 107 108  CO2 28 25 27 28   GLUCOSE 130* 158* 95 94  BUN 29* 30* 37* 32*  CREATININE 0.84 0.71 0.85 0.69  CALCIUM  9.0 8.2* 8.3* 8.3*   GFR: Estimated Creatinine Clearance: 36.3 mL/min (by C-G formula based on SCr of 0.69 mg/dL). Liver Function Tests: No results for input(s): AST, ALT, ALKPHOS, BILITOT, PROT, ALBUMIN  in the last 168 hours. No results for input(s): LIPASE, AMYLASE in the last 168 hours. No results for input(s): AMMONIA in the last 168 hours. Coagulation Profile: No results for input(s): INR, PROTIME in the last 168 hours. Cardiac Enzymes: No results for input(s): CKTOTAL, CKMB, CKMBINDEX, TROPONINI in the last  168 hours. BNP (last 3 results) No results for input(s): PROBNP in the last 8760 hours. HbA1C: No results for input(s): HGBA1C in the last 72 hours. CBG: No results for input(s): GLUCAP in the last 168 hours. Lipid Profile: No results for input(s): CHOL, HDL, LDLCALC, TRIG, CHOLHDL, LDLDIRECT in the last 72 hours. Thyroid Function Tests: No results for input(s): TSH, T4TOTAL, FREET4, T3FREE, THYROIDAB in the last 72 hours. Anemia Panel: Recent Labs    06/08/24 0233  VITAMINB12 1,139*  FERRITIN 94  TIBC 183*  IRON 20*   Sepsis Labs: No results for input(s): PROCALCITON, LATICACIDVEN in the last 168 hours.  No results found for this or any previous visit (from the past 240 hours).       Radiology Studies: No results found.       Scheduled Meds:  sodium chloride    Intravenous Once   acetaminophen   1,000 mg Oral Q8H   aspirin   81 mg Oral BID   feeding supplement  237 mL Oral BID BM   mirtazapine   7.5 mg Oral QHS   polyethylene glycol  17 g Oral Daily   Continuous Infusions:     LOS: 3 days    Time spent: 45 minutes spent on chart review, discussion with nursing staff, consultants, updating family and interview/physical exam; more than 50% of that time was spent in counseling and/or coordination of care.    Harlene RAYMOND Bowl, DO [facility] Available via Epic secure chat 7am-7pm After these hours, please refer to coverage provider listed on [email or web address] 06/10/2024, 10:52 AM

## 2024-06-11 LAB — CBC
HCT: 27 % — ABNORMAL LOW (ref 36.0–46.0)
Hemoglobin: 9.2 g/dL — ABNORMAL LOW (ref 12.0–15.0)
MCH: 32.6 pg (ref 26.0–34.0)
MCHC: 34.1 g/dL (ref 30.0–36.0)
MCV: 95.7 fL (ref 80.0–100.0)
Platelets: 129 K/uL — ABNORMAL LOW (ref 150–400)
RBC: 2.82 MIL/uL — ABNORMAL LOW (ref 3.87–5.11)
RDW: 16.5 % — ABNORMAL HIGH (ref 11.5–15.5)
WBC: 8.8 K/uL (ref 4.0–10.5)
nRBC: 0 % (ref 0.0–0.2)

## 2024-06-11 MED ORDER — ASPIRIN 81 MG PO CHEW: 81.0000 mg | CHEWABLE_TABLET | Freq: Two times a day (BID) | ORAL | Status: DC

## 2024-06-11 MED ORDER — ACETAMINOPHEN 500 MG PO TABS: 1000.0000 mg | ORAL_TABLET | Freq: Three times a day (TID) | ORAL | Status: DC

## 2024-06-11 MED ORDER — SACUBITRIL-VALSARTAN 24-26 MG PO TABS
1.0000 | ORAL_TABLET | Freq: Two times a day (BID) | ORAL | Status: DC
  Administered 2024-06-11: 09:00:00 1 via ORAL
  Filled 2024-06-11: qty 1

## 2024-06-11 MED ORDER — OXYCODONE HCL 5 MG PO TABS: 2.5000 mg | ORAL_TABLET | ORAL | 0 refills | Status: DC | PRN

## 2024-06-11 MED ORDER — CARVEDILOL 6.25 MG PO TABS: 6.2500 mg | ORAL_TABLET | Freq: Two times a day (BID) | ORAL | Status: DC

## 2024-06-11 NOTE — Progress Notes (Signed)
 Pt discharged to [facility] place via [place] with all belongings, paperwork, and printed prescriptions.

## 2024-06-11 NOTE — Discharge Summary (Signed)
 Physician Discharge Summary  Tanya Roy FMW:[id number] DOB: 08/15/1928 DOA: 06/07/2024  PCP: Diedra Lame, MD  Admit date: 06/07/2024 Discharge date: 06/11/2024  Admitted From:  Discharge disposition: SNF   Recommendations for Outpatient Follow-Up:   Has not required any pain meds Cbc 1 week Weight bearing status: TDWB RLE    Discharge Diagnosis:   Principal Problem:   Right femoral fracture (HCC) Active Problems:   Closed displaced spiral fracture of shaft of right femur (HCC)    Discharge Condition: Improved.  Diet recommendation: Low sodium, heart healthy.  Wound care: None.  Code status: DNR   History of Present Illness:   Tanya Roy is a 89 y.o. female with medical history significant of hypertension, hyperlipidemia, CAD, CHF, osteoporosis, dementia, prior history of intertrochanteric femur fracture that underwent cephalomedullary rodding in 2018 presented after a witnessed mechanical fall at the nursing facility prior to arrival.   Upon my evaluation: Patient in PACU-not arousable due to anesthesia.  History gathered from charts.  At baseline patient is oriented to person only.  She was brought by EMS to [facility] where CT showed spiral minimally displaced periprosthetic fracture about the distal right femoral diaphysis, suspected chip fracture of the posterior medial epicondyles, lipohemarthrosis.  CT head/C-spine negative for any acute findings.  EDP discussed the case with orthopedic surgery at [place] who felt that patient needed to be transferred to [facility]  Patient underwent open reduction internal fixation of distal right femur fracture by Dr. Georgina.   Patient is DNR/DNI   Hospital Course by Problem:   Right periprosthetic femur fracture: - Status post open reduction and internal fixation of distal femur fracture on 06/07/2024. - Monitor vitals closely and H&H closely-transfuse 1 unit PRBC on 12/15 -PT/OT  eval-SNF -per ortho- ASA BID x 30 days     Status post fall: - CT head/cervical spine negative for any acute findings   Acute hypoxemic respiratory failure: - Weaned to room air   CAD status post PCI 02/15/2021 - On aspirin , statin, Coreg , Entresto , nitro as needed and Jardiance . - Resume home meds  HFrEF: -resume home meds   Hyperlipidemia: - Hold statin   Leukocytosis: Likely reactive. - Afebrile -resolved     Dementia: - At baseline -Delirium precautions   Acute blood loss anemia status post procedure - Baseline hemoglobin between 13-14.   - Status post 1 unit PRBC on 12/15 with good results      Medical Consultants:   ortho   Discharge Exam:   Vitals:   06/10/24 2120 06/11/24 0536  BP: (!) 162/63 (!) 157/81  Pulse:  86  Resp: 17 16  Temp: 98.1 F (36.7 C) 98.4 F (36.9 C)  SpO2:  98%   Vitals:   06/10/24 0750 06/10/24 1802 06/10/24 2120 06/11/24 0536  BP: 134/74 (!) 139/58 (!) 162/63 (!) 157/81  Pulse: 71 87  86  Resp: 17 17 17 16   Temp:  (!) 97.4 F (36.3 C) 98.1 F (36.7 C) 98.4 F (36.9 C)  TempSrc:  Oral  Oral  SpO2: 97% 97%  98%  Weight: 51.3 kg     Height: 5' 6 (1.676 m)       General exam: Appears calm and comfortable.    The results of significant diagnostics from this hospitalization (including imaging, microbiology, ancillary and laboratory) are listed below for reference.     Procedures and Diagnostic Studies:   DG FEMUR, MIN 2 VIEWS RIGHT Result Date: 06/07/2024 CLINICAL DATA:  [id number] Surgery, elective [id number] EXAM: RIGHT FEMUR 2 VIEWS COMPARISON:  June 06, 2024 FINDINGS: Spot fluoroscopy images were obtained for surgical planning purposes. This demonstrates placement of an additional screw and plate fixation along the distal femur with corresponding improved alignment of the distal femoral fracture. Status post intramedullary rod fixation of the RIGHT femur. Time: 120 second Dose: 9.42 mGy Please reference procedure  report for further details. IMPRESSION: Spot fluoroscopy images obtained for surgical planning purposes. Electronically Signed   By: Corean Salter M.D.   On: 06/07/2024 18:11   DG C-Arm 1-60 Min-No Report Result Date: 06/07/2024 Fluoroscopy was utilized by the requesting physician.  No radiographic interpretation.   DG C-Arm 1-60 Min-No Report Result Date: 06/07/2024 Fluoroscopy was utilized by the requesting physician.  No radiographic interpretation.   DG C-Arm 1-60 Min-No Report Result Date: 06/07/2024 Fluoroscopy was utilized by the requesting physician.  No radiographic interpretation.     Labs:   Basic Metabolic Panel: Recent Labs  Lab 06/06/24 2151 06/08/24 0233 06/09/24 0523 06/10/24 0554  NA 140 137 138 140  K 4.2 4.2 4.1 4.0  CL 106 104 107 108  CO2 28 25 27 28   GLUCOSE 130* 158* 95 94  BUN 29* 30* 37* 32*  CREATININE 0.84 0.71 0.85 0.69  CALCIUM  9.0 8.2* 8.3* 8.3*   GFR Estimated Creatinine Clearance: 36.3 mL/min (by C-G formula based on SCr of 0.69 mg/dL). Liver Function Tests: No results for input(s): AST, ALT, ALKPHOS, BILITOT, PROT, ALBUMIN  in the last 168 hours. No results for input(s): LIPASE, AMYLASE in the last 168 hours. No results for input(s): AMMONIA in the last 168 hours. Coagulation profile No results for input(s): INR, PROTIME in the last 168 hours.  CBC: Recent Labs  Lab 06/06/24 2151 06/08/24 0233 06/08/24 0811 06/09/24 0523 06/09/24 2034 06/10/24 0554 06/11/24 0357  WBC 11.3* 17.9* 17.0* 14.2*  --  12.8* 8.8  NEUTROABS 8.8*  --   --   --   --   --   --   HGB 11.7* 8.6* 7.8* 7.4* 9.3* 10.0* 9.2*  HCT 34.9* 25.7* 23.1* 21.6* 26.9* 29.3* 27.0*  MCV 100.3* 100.8* 100.9* 99.1  --  96.7 95.7  PLT 160 102* 97* 112*  --  118* 129*   Cardiac Enzymes: No results for input(s): CKTOTAL, CKMB, CKMBINDEX, TROPONINI in the last 168 hours. BNP: Invalid input(s): POCBNP CBG: No results for input(s):  GLUCAP in the last 168 hours. D-Dimer No results for input(s): DDIMER in the last 72 hours. Hgb A1c No results for input(s): HGBA1C in the last 72 hours. Lipid Profile No results for input(s): CHOL, HDL, LDLCALC, TRIG, CHOLHDL, LDLDIRECT in the last 72 hours. Thyroid function studies No results for input(s): TSH, T4TOTAL, T3FREE, THYROIDAB in the last 72 hours.  Invalid input(s): FREET3 Anemia work up No results for input(s): VITAMINB12, FOLATE, FERRITIN, TIBC, IRON, RETICCTPCT in the last 72 hours. Microbiology No results found for this or any previous visit (from the past 240 hours).   Discharge Instructions:   Discharge Instructions     Diet general   Complete by: As directed    Increase activity slowly   Complete by: As directed    No wound care   Complete by: As directed       Allergies as of 06/11/2024   No Known Allergies      Medication List     STOP taking these medications    aspirin  EC 81 MG tablet Replaced by: aspirin  81 MG  chewable tablet   nitroGLYCERIN  0.4 MG SL tablet Commonly known as: NITROSTAT        TAKE these medications    acetaminophen  500 MG tablet Commonly known as: TYLENOL  Take 2 tablets (1,000 mg total) by mouth every 8 (eight) hours. What changed:  medication strength how much to take when to take this reasons to take this   aspirin  81 MG chewable tablet Chew 1 tablet (81 mg total) by mouth 2 (two) times daily. Replaces: aspirin  EC 81 MG tablet   atorvastatin  20 MG tablet Commonly known as: LIPITOR  Take 20 mg by mouth at bedtime.   B-12 1000 MCG Caps Take 1 capsule by mouth daily.   CALCIUM  600-D PO Take 1 tablet by mouth in the morning and at bedtime.   carvedilol  6.25 MG tablet Commonly known as: COREG  TAKE 1 TABLET BY MOUTH 2 TIMES PER DAY   Ensure Original Liqd Take 1 Bottle by mouth in the morning and at bedtime.   Entresto  24-26 MG Generic drug:  sacubitril -valsartan  TAKE 1 TABLE BY MOUTH 2 TIMES DAILY   mirtazapine  7.5 MG tablet Commonly known as: REMERON  Take 7.5 mg by mouth at bedtime.   oxyCODONE  5 MG immediate release tablet Commonly known as: Oxy IR/ROXICODONE  Take 0.5-1 tablets (2.5-5 mg total) by mouth every 4 (four) hours as needed for moderate pain (pain score 4-6) or severe pain (pain score 7-10).   polyethylene glycol 17 g packet Commonly known as: MIRALAX  / GLYCOLAX  Take 17 g by mouth daily.          Time coordinating discharge: 45 min  Signed:  Harlene RAYMOND Bowl DO  [facility] 06/11/2024, 8:37 AM

## 2024-06-11 NOTE — Progress Notes (Signed)
 Orthopedic Surgery Progress Note   Assessment: Patient is a 89 y.o. female with right femur fracture status post ORIF   Plan: -Operative plans: complete -Diet: Diabetic -DVT ppx: aspirin  81mg  BID -Antibiotics: ancef  x2 post-op doses -Weight bearing status: TDWB RLE -PT evaluate and treat -Pain control -Dispo: Per primary  ___________________________________________________________________________  Subjective: No acute events overnight.  Pain well-controlled.  Wanted to go to the bathroom this morning.   Physical Exam:  General: no acute distress, appears stated age Neurologic: alert, confused Respiratory: unlabored breathing on room air, symmetric chest rise Psychiatric: appropriate affect, normal cadence to speech  MSK:   -Right lower extremity  Most distal dressing with small amount of dried blood, dressings otherwise c/d/i EHL/TA/GSC intact Plantarflexes and dorsiflexes toes Sensation intact to light touch in sural, saphenous, tibial, deep peroneal, and superficial peroneal nerve distributions Foot warm and well perfused   Yesterday's total administered Morphine  Milligram Equivalents: 0   Patient name: Tanya Roy Patient MRN: [id number] Date: 06/11/2024

## 2024-06-11 NOTE — TOC Transition Note (Signed)
 Transition of Care [facility]) - Discharge Note   Patient Details  Name: Tanya Roy MRN: [id number] Date of Birth: [birth year]  [facility] of [facility] CM/SW Contact:  Jeoffrey LITTIE [name] Phone Number: 06/11/2024, 9:09 AM   Clinical Narrative:    Patient will DC to: [facility] Anticipated DC date: 06/11/24 Family notified: Yes Transport by: [facility]   Per MD patient ready for DC to [facility]. RN to call report prior to discharge [phone] room 207. RN, patient, patient's family, and facility notified of DC. Discharge Summary and FL2 sent to facility. DC packet on chart. Ambulance transport requested for patient.   CSW will sign off for now as social work intervention is no longer needed. Please consult us  again if new needs arise.     Final next level of care: Skilled Nursing Facility Barriers to Discharge: Barriers Resolved   Patient Goals and CMS Choice Patient states their goals for this hospitalization and ongoing recovery are:: SNF          Discharge Placement   Existing PASRR number confirmed : 06/11/24          Patient chooses bed at: [facility] Patient to be transferred to facility by: [facility] Name of family member notified: Debbie Patient and family notified of of transfer: 06/11/24  Discharge Plan and Services Additional resources added to the After Visit Summary for                                       Social Drivers of Health (SDOH) Interventions SDOH Screenings   Food Insecurity: No Food Insecurity (06/08/2024)  Housing: Low Risk (06/08/2024)  Transportation Needs: Patient Declined (06/08/2024)  Utilities: Not At Risk (06/08/2024)  Social Connections: Unknown (06/08/2024)  Tobacco Use: Low Risk (06/07/2024)     Readmission Risk Interventions     No data to display

## 2024-06-11 NOTE — Progress Notes (Signed)
 Attempted to call report to [facility] place this morning [phone]) spoke to nurse who will be taking care of pt in room 207 when she arrives. Nurse stated  she was about to start her med pass and asked could I call back in 3 hours. I let her know that the patient would be there by then. Nurse stated  that's fine she doesn't need report'.

## 2024-06-11 NOTE — Plan of Care (Signed)

## 2024-06-18 ENCOUNTER — Emergency Department (HOSPITAL_COMMUNITY)

## 2024-06-18 ENCOUNTER — Inpatient Hospital Stay (HOSPITAL_COMMUNITY): Admission: EM | Admit: 2024-06-18 | Discharge: 2024-06-26 | DRG: 951 | Disposition: E | Source: Skilled Nursing Facility

## 2024-06-18 ENCOUNTER — Other Ambulatory Visit: Payer: Self-pay

## 2024-06-18 ENCOUNTER — Encounter (HOSPITAL_COMMUNITY): Payer: Self-pay

## 2024-06-18 DIAGNOSIS — Z8249 Family history of ischemic heart disease and other diseases of the circulatory system: Secondary | ICD-10-CM

## 2024-06-18 DIAGNOSIS — R7989 Other specified abnormal findings of blood chemistry: Secondary | ICD-10-CM

## 2024-06-18 DIAGNOSIS — I110 Hypertensive heart disease with heart failure: Secondary | ICD-10-CM | POA: Diagnosis present

## 2024-06-18 DIAGNOSIS — Z66 Do not resuscitate: Secondary | ICD-10-CM | POA: Diagnosis present

## 2024-06-18 DIAGNOSIS — E874 Mixed disorder of acid-base balance: Secondary | ICD-10-CM | POA: Diagnosis present

## 2024-06-18 DIAGNOSIS — Z1152 Encounter for screening for COVID-19: Secondary | ICD-10-CM

## 2024-06-18 DIAGNOSIS — Z85820 Personal history of malignant melanoma of skin: Secondary | ICD-10-CM

## 2024-06-18 DIAGNOSIS — I5032 Chronic diastolic (congestive) heart failure: Secondary | ICD-10-CM | POA: Diagnosis present

## 2024-06-18 DIAGNOSIS — Z993 Dependence on wheelchair: Secondary | ICD-10-CM

## 2024-06-18 DIAGNOSIS — Z7982 Long term (current) use of aspirin: Secondary | ICD-10-CM

## 2024-06-18 DIAGNOSIS — J189 Pneumonia, unspecified organism: Secondary | ICD-10-CM | POA: Diagnosis present

## 2024-06-18 DIAGNOSIS — Z515 Encounter for palliative care: Principal | ICD-10-CM

## 2024-06-18 DIAGNOSIS — E785 Hyperlipidemia, unspecified: Secondary | ICD-10-CM | POA: Diagnosis present

## 2024-06-18 DIAGNOSIS — J9601 Acute respiratory failure with hypoxia: Secondary | ICD-10-CM | POA: Diagnosis present

## 2024-06-18 DIAGNOSIS — M810 Age-related osteoporosis without current pathological fracture: Secondary | ICD-10-CM | POA: Diagnosis present

## 2024-06-18 DIAGNOSIS — I252 Old myocardial infarction: Secondary | ICD-10-CM

## 2024-06-18 DIAGNOSIS — I2489 Other forms of acute ischemic heart disease: Secondary | ICD-10-CM | POA: Diagnosis present

## 2024-06-18 DIAGNOSIS — Z79899 Other long term (current) drug therapy: Secondary | ICD-10-CM

## 2024-06-18 DIAGNOSIS — N179 Acute kidney failure, unspecified: Secondary | ICD-10-CM | POA: Diagnosis present

## 2024-06-18 DIAGNOSIS — I2699 Other pulmonary embolism without acute cor pulmonale: Secondary | ICD-10-CM | POA: Diagnosis present

## 2024-06-18 DIAGNOSIS — I2693 Single subsegmental pulmonary embolism without acute cor pulmonale: Principal | ICD-10-CM | POA: Diagnosis present

## 2024-06-18 DIAGNOSIS — M1990 Unspecified osteoarthritis, unspecified site: Secondary | ICD-10-CM | POA: Diagnosis present

## 2024-06-18 DIAGNOSIS — Z9049 Acquired absence of other specified parts of digestive tract: Secondary | ICD-10-CM

## 2024-06-18 DIAGNOSIS — I2510 Atherosclerotic heart disease of native coronary artery without angina pectoris: Secondary | ICD-10-CM | POA: Diagnosis present

## 2024-06-18 DIAGNOSIS — J690 Pneumonitis due to inhalation of food and vomit: Secondary | ICD-10-CM | POA: Diagnosis present

## 2024-06-18 DIAGNOSIS — F0390 Unspecified dementia without behavioral disturbance: Secondary | ICD-10-CM | POA: Diagnosis present

## 2024-06-18 DIAGNOSIS — R0902 Hypoxemia: Principal | ICD-10-CM

## 2024-06-18 LAB — BASIC METABOLIC PANEL WITH GFR
Anion gap: 16 — ABNORMAL HIGH (ref 5–15)
BUN: 48 mg/dL — ABNORMAL HIGH (ref 8–23)
CO2: 16 mmol/L — ABNORMAL LOW (ref 22–32)
Calcium: 8.5 mg/dL — ABNORMAL LOW (ref 8.9–10.3)
Chloride: 111 mmol/L (ref 98–111)
Creatinine, Ser: 1.4 mg/dL — ABNORMAL HIGH (ref 0.44–1.00)
GFR, Estimated: 35 mL/min — ABNORMAL LOW
Glucose, Bld: 95 mg/dL (ref 70–99)
Potassium: 4 mmol/L (ref 3.5–5.1)
Sodium: 143 mmol/L (ref 135–145)

## 2024-06-18 LAB — I-STAT VENOUS BLOOD GAS, ED
Acid-base deficit: 7 mmol/L — ABNORMAL HIGH (ref 0.0–2.0)
Bicarbonate: 14.6 mmol/L — ABNORMAL LOW (ref 20.0–28.0)
Calcium, Ion: 1.07 mmol/L — ABNORMAL LOW (ref 1.15–1.40)
HCT: 33 % — ABNORMAL LOW (ref 36.0–46.0)
Hemoglobin: 11.2 g/dL — ABNORMAL LOW (ref 12.0–15.0)
O2 Saturation: 96 %
Potassium: 3.8 mmol/L (ref 3.5–5.1)
Sodium: 144 mmol/L (ref 135–145)
TCO2: 15 mmol/L — ABNORMAL LOW (ref 22–32)
pCO2, Ven: 18.8 mmHg — CL (ref 44–60)
pH, Ven: 7.5 — ABNORMAL HIGH (ref 7.25–7.43)
pO2, Ven: 69 mmHg — ABNORMAL HIGH (ref 32–45)

## 2024-06-18 LAB — CBC WITH DIFFERENTIAL/PLATELET
Abs Immature Granulocytes: 0.14 K/uL — ABNORMAL HIGH (ref 0.00–0.07)
Basophils Absolute: 0.1 K/uL (ref 0.0–0.1)
Basophils Relative: 0 %
Eosinophils Absolute: 0 K/uL (ref 0.0–0.5)
Eosinophils Relative: 0 %
HCT: 33 % — ABNORMAL LOW (ref 36.0–46.0)
Hemoglobin: 10.8 g/dL — ABNORMAL LOW (ref 12.0–15.0)
Immature Granulocytes: 1 %
Lymphocytes Relative: 2 %
Lymphs Abs: 0.4 K/uL — ABNORMAL LOW (ref 0.7–4.0)
MCH: 34.2 pg — ABNORMAL HIGH (ref 26.0–34.0)
MCHC: 32.7 g/dL (ref 30.0–36.0)
MCV: 104.4 fL — ABNORMAL HIGH (ref 80.0–100.0)
Monocytes Absolute: 0.7 K/uL (ref 0.1–1.0)
Monocytes Relative: 3 %
Neutro Abs: 24.6 K/uL — ABNORMAL HIGH (ref 1.7–7.7)
Neutrophils Relative %: 94 %
Platelets: 296 K/uL (ref 150–400)
RBC: 3.16 MIL/uL — ABNORMAL LOW (ref 3.87–5.11)
RDW: 19 % — ABNORMAL HIGH (ref 11.5–15.5)
Smear Review: NORMAL
WBC: 25.9 K/uL — ABNORMAL HIGH (ref 4.0–10.5)
nRBC: 0.2 % (ref 0.0–0.2)

## 2024-06-18 LAB — TROPONIN T, HIGH SENSITIVITY: Troponin T High Sensitivity: 491 ng/L (ref 0–19)

## 2024-06-18 LAB — RESP PANEL BY RT-PCR (RSV, FLU A&B, COVID)  RVPGX2
Influenza A by PCR: NEGATIVE
Influenza B by PCR: NEGATIVE
Resp Syncytial Virus by PCR: NEGATIVE
SARS Coronavirus 2 by RT PCR: NEGATIVE

## 2024-06-18 LAB — D-DIMER, QUANTITATIVE: D-Dimer, Quant: 5.48 ug{FEU}/mL — ABNORMAL HIGH (ref 0.00–0.50)

## 2024-06-18 LAB — GLUCOSE, CAPILLARY: Glucose-Capillary: 91 mg/dL (ref 70–99)

## 2024-06-18 MED ORDER — ACETAMINOPHEN 650 MG RE SUPP: 650.0000 mg | Freq: Four times a day (QID) | RECTAL | Status: DC | PRN

## 2024-06-18 MED ORDER — HEPARIN BOLUS VIA INFUSION
3000.0000 [IU] | Freq: Once | INTRAVENOUS | Status: DC
  Filled 2024-06-18: qty 3000

## 2024-06-18 MED ORDER — GLYCOPYRROLATE 1 MG PO TABS: 1.0000 mg | ORAL_TABLET | ORAL | Status: DC | PRN

## 2024-06-18 MED ORDER — BIOTENE DRY MOUTH MT LIQD: 15.0000 mL | OROMUCOSAL | Status: DC | PRN

## 2024-06-18 MED ORDER — KETOROLAC TROMETHAMINE 30 MG/ML IJ SOLN
30.0000 mg | Freq: Once | INTRAMUSCULAR | Status: AC
  Administered 2024-06-18: 30 mg via INTRAMUSCULAR
  Filled 2024-06-18: qty 1

## 2024-06-18 MED ORDER — IOHEXOL 350 MG/ML SOLN
75.0000 mL | Freq: Once | INTRAVENOUS | Status: AC | PRN
  Administered 2024-06-18: 75 mL via INTRAVENOUS

## 2024-06-18 MED ORDER — SODIUM CHLORIDE 0.9 % IV SOLN: 1.0000 g | Freq: Once | INTRAVENOUS | Status: DC

## 2024-06-18 MED ORDER — POLYVINYL ALCOHOL 1.4 % OP SOLN: 1.0000 [drp] | Freq: Four times a day (QID) | OPHTHALMIC | Status: DC | PRN

## 2024-06-18 MED ORDER — SODIUM CHLORIDE 0.9 % IV SOLN
500.0000 mg | Freq: Once | INTRAVENOUS | Status: AC
  Administered 2024-06-18: 500 mg via INTRAVENOUS
  Filled 2024-06-18: qty 5

## 2024-06-18 MED ORDER — ALBUTEROL SULFATE (2.5 MG/3ML) 0.083% IN NEBU: 2.5000 mg | INHALATION_SOLUTION | RESPIRATORY_TRACT | Status: DC | PRN

## 2024-06-18 MED ORDER — ONDANSETRON 4 MG PO TBDP: 4.0000 mg | ORAL_TABLET | Freq: Four times a day (QID) | ORAL | Status: DC | PRN

## 2024-06-18 MED ORDER — HYDROMORPHONE HCL 1 MG/ML IJ SOLN
0.5000 mg | INTRAMUSCULAR | Status: DC | PRN
  Administered 2024-06-18 – 2024-06-19 (×2): 0.5 mg via INTRAVENOUS
  Filled 2024-06-18: qty 2
  Filled 2024-06-18 (×2): qty 1

## 2024-06-18 MED ORDER — ONDANSETRON HCL 4 MG/2ML IJ SOLN: 4.0000 mg | Freq: Four times a day (QID) | INTRAMUSCULAR | Status: DC | PRN

## 2024-06-18 MED ORDER — LORAZEPAM 2 MG/ML IJ SOLN
1.0000 mg | INTRAMUSCULAR | Status: DC | PRN
  Administered 2024-06-19 (×2): 1 mg via INTRAVENOUS
  Filled 2024-06-18 (×2): qty 1

## 2024-06-18 MED ORDER — HALOPERIDOL LACTATE 2 MG/ML PO CONC: 0.5000 mg | ORAL | Status: DC | PRN

## 2024-06-18 MED ORDER — HEPARIN (PORCINE) 25000 UT/250ML-% IV SOLN
800.0000 [IU]/h | INTRAVENOUS | Status: DC
  Filled 2024-06-18: qty 250

## 2024-06-18 MED ORDER — LORAZEPAM 2 MG/ML PO CONC: 1.0000 mg | ORAL | Status: DC | PRN

## 2024-06-18 MED ORDER — SODIUM CHLORIDE 0.9 % IV SOLN
2.0000 g | Freq: Once | INTRAVENOUS | Status: AC
  Administered 2024-06-18: 2 g via INTRAVENOUS
  Filled 2024-06-18: qty 20

## 2024-06-18 MED ORDER — GLYCOPYRROLATE 0.2 MG/ML IJ SOLN: 0.2000 mg | INTRAMUSCULAR | Status: DC | PRN

## 2024-06-18 MED ORDER — ACETAMINOPHEN 325 MG PO TABS: 650.0000 mg | ORAL_TABLET | Freq: Four times a day (QID) | ORAL | Status: DC | PRN

## 2024-06-18 MED ORDER — DIPHENHYDRAMINE HCL 50 MG/ML IJ SOLN: 12.5000 mg | INTRAMUSCULAR | Status: DC | PRN

## 2024-06-18 MED ORDER — HALOPERIDOL LACTATE 5 MG/ML IJ SOLN: 0.5000 mg | INTRAMUSCULAR | Status: DC | PRN

## 2024-06-18 MED ORDER — SODIUM CHLORIDE 0.9 % IV BOLUS: 1000.0000 mL | Freq: Once | INTRAVENOUS | Status: DC

## 2024-06-18 MED ORDER — HALOPERIDOL 0.5 MG PO TABS: 0.5000 mg | ORAL_TABLET | ORAL | Status: DC | PRN

## 2024-06-18 MED ORDER — LORAZEPAM 1 MG PO TABS: 1.0000 mg | ORAL_TABLET | ORAL | Status: DC | PRN

## 2024-06-18 MED ORDER — SODIUM CHLORIDE 0.9 % IV BOLUS
1000.0000 mL | Freq: Once | INTRAVENOUS | Status: AC
  Administered 2024-06-18: 1000 mL via INTRAVENOUS

## 2024-06-18 NOTE — Progress Notes (Signed)
" °   06/18/24 1932  Spiritual Encounters  Type of Visit Initial  Care provided to: Pt and family  Conversation partners present during encounter Nurse  Referral source Family  Reason for visit Urgent spiritual support  OnCall Visit Yes   Responded to consult's urgent request for visit due to eol. Patient moved to comfort care. Family present (2 sisters and Niece) at bedside. Provided spiritual support.  "

## 2024-06-18 NOTE — ED Triage Notes (Signed)
 Pt arrived via [facility] from [facility] for hypoxia. Per EMS, pt has had low Sa02 all day and Sa02 was 86% on [place]. Per EMS, pt has crackles and rales in lower lobes bilat. Pt's speech is not understandable.

## 2024-06-18 NOTE — TOC CM/SW Note (Signed)
 TOC consult received for possible hospice placement. Per notes, patient arrived from [facility]. Message sent via Epic to confirm and determine if they can accept patient back with hospice services. Awaiting response. Follow-up to be completed as appropriate with patient/family.  Merilee Batty, MSN, RN Case Management [phone]

## 2024-06-18 NOTE — ED Provider Notes (Signed)
 "  EMERGENCY DEPARTMENT AT [facility] Provider Note   CSN: [id number] Arrival date & time: 06/18/24  1347     Patient presents with: Respiratory Distress   Tanya Roy is a 89 y.o. female presenting by EMS from [facility] with SOB and chest pain.  Patient is complaining of substernal chest pressure and pain that began within the past 24 hours.  Of note, she was recently hospitalized and discharged after distal right femur fracture undergoing open reduction on 06/07/24, requiring 1 unit pRBC on 12/5.  She was noted to have acute hypoxic respiratory failure while in the hospital, has a history noted for congestive heart failure, coronary disease, high blood pressure, dementia, hypertension. Per last hospitalization discharge patient is DNR/DNI  EMS reports the patient was persistently hypoxic despite nasal cannula requiring nonrebreather mask, and had improvement of her oxygen saturation afterward  Family (sisters) at bedside confirm the patient is DNR/DNI   HPI     Prior to Admission medications  Medication Sig Start Date End Date Taking? Authorizing Provider  acetaminophen  (TYLENOL ) 500 MG tablet Take 2 tablets (1,000 mg total) by mouth every 8 (eight) hours. 06/11/24   Vann, Jessica U, DO  aspirin  81 MG chewable tablet Chew 1 tablet (81 mg total) by mouth 2 (two) times daily. 06/11/24   Vann, Jessica U, DO  atorvastatin  (LIPITOR ) 20 MG tablet Take 20 mg by mouth at bedtime. 05/25/23   [provider]  Calcium  Carbonate-Vitamin D  (CALCIUM  600-D PO) Take 1 tablet by mouth in the morning and at bedtime.    [provider]  carvedilol  (COREG ) 6.25 MG tablet TAKE 1 TABLET BY MOUTH 2 TIMES PER DAY 04/02/23   Darron Deatrice LABOR, MD  Cyanocobalamin (B-12) 1000 MCG CAPS Take 1 capsule by mouth daily. 02/12/23   [provider]  ENTRESTO  24-26 MG TAKE 1 TABLE BY MOUTH 2 TIMES DAILY 08/11/22   Darron Deatrice LABOR, MD  mirtazapine  (REMERON )  7.5 MG tablet Take 7.5 mg by mouth at bedtime.    [provider]  Nutritional Supplements (ENSURE ORIGINAL) LIQD Take 1 Bottle by mouth in the morning and at bedtime.    [provider]  oxyCODONE  (OXY IR/ROXICODONE ) 5 MG immediate release tablet Take 0.5-1 tablets (2.5-5 mg total) by mouth every 4 (four) hours as needed for moderate pain (pain score 4-6) or severe pain (pain score 7-10). 06/11/24   Vann, Jessica U, DO  polyethylene glycol (MIRALAX  / GLYCOLAX ) 17 g packet Take 17 g by mouth daily.    [provider]    Allergies: Patient has no known allergies.    Review of Systems  Updated Vital Signs BP (!) 111/50   Pulse 81   Temp 99.5 F (37.5 C) (Axillary)   Resp (!) 31   SpO2 93%   Physical Exam Constitutional:      General: She is not in acute distress.    Comments: Thin, frail  HENT:     Head: Normocephalic and atraumatic.  Eyes:     Conjunctiva/sclera: Conjunctivae normal.     Pupils: Pupils are equal, round, and reactive to light.  Cardiovascular:     Rate and Rhythm: Normal rate and regular rhythm.  Pulmonary:     Effort: Pulmonary effort is normal. No respiratory distress.     Comments: NRB mask at 92% O2, no audible wheezing Abdominal:     General: There is no distension.     Tenderness: There is no abdominal  tenderness.  Musculoskeletal:     Comments: Surgical incision site intact, right leg  Skin:    General: Skin is warm and dry.  Neurological:     General: No focal deficit present.     Mental Status: She is alert. Mental status is at baseline.     (all labs ordered are listed, but only abnormal results are displayed) Labs Reviewed  CBC WITH DIFFERENTIAL/PLATELET - Abnormal; Notable for the following components:      Result Value   WBC 25.9 (*)    RBC 3.16 (*)    Hemoglobin 10.8 (*)    HCT 33.0 (*)    MCV 104.4 (*)    MCH 34.2 (*)    RDW 19.0 (*)    All other components within normal limits  I-STAT VENOUS BLOOD GAS,  ED - Abnormal; Notable for the following components:   pH, Ven 7.500 (*)    pCO2, Ven 18.8 (*)    pO2, Ven 69 (*)    Bicarbonate 14.6 (*)    TCO2 15 (*)    Acid-base deficit 7.0 (*)    Calcium , Ion 1.07 (*)    HCT 33.0 (*)    Hemoglobin 11.2 (*)    All other components within normal limits  RESP PANEL BY RT-PCR (RSV, FLU A&B, COVID)  RVPGX2  D-DIMER, QUANTITATIVE  BASIC METABOLIC PANEL WITH GFR  TROPONIN T, HIGH SENSITIVITY    EKG: EKG Interpretation Date/Time:  Wednesday June 18 2024 14:02:08 EST Ventricular Rate:  93 PR Interval:  129 QRS Duration:  101 QT Interval:    QTC Calculation:   R Axis:   111  Text Interpretation: Sinus rhythm Anterior infarct, old Minimal ST depression, inferior leads Confirmed by Cottie Cough [phone]) on 06/18/2024 2:05:19 PM  Radiology: DG Chest Portable 1 View Result Date: 06/18/2024 EXAM: 1 VIEW(S) XRAY OF THE CHEST 06/18/2024 02:20:43 PM COMPARISON: 08/23/2023 CLINICAL HISTORY: SOB FINDINGS: LUNGS AND PLEURA: Emphysematous lungs. New Bilateral patchy airspace disease, most dense in upper lobes, left worse than right. No pleural effusion. No pneumothorax. HEART AND MEDIASTINUM: Cardiomegaly. Calcified and tortuous aorta. Coronary artery stents noted. BONES AND SOFT TISSUES: Osteopenia, scoliosis and degenerative changes of spine. Multiple chronic compression fractures in midthoracic spine. IMPRESSION: 1. New bilateral patchy airspace disease, most dense in the upper lobes, left worse than right. 2. Emphysematous lungs. 3. Cardiomegaly. Electronically signed by: Dayne Hassell MD 06/18/2024 02:58 PM EST RP Workstation: [id number]     Procedures   Medications Ordered in the ED  azithromycin  (ZITHROMAX ) 500 mg in sodium chloride  0.9 % 250 mL IVPB (has no administration in time range)  cefTRIAXone  (ROCEPHIN ) 2 g in sodium chloride  0.9 % 100 mL IVPB (has no administration in time range)  ketorolac  (TORADOL ) 30 MG/ML injection 30 mg (has no  administration in time range)  sodium chloride  0.9 % bolus 1,000 mL (has no administration in time range)    Clinical Course as of 06/18/24 1528  Wed Jun 18, 2024  1406 EKG per my interpretation shows isolated ST elevation in V3, does not meet STEMI criteria [MT]  1412 No answer Debbie Deiner listed as PoA [MT]  1524 Family at bedside (sisters) CONFIRM DNR/DNI status [MT]    Clinical Course User Index [MT] Nil Xiong, Cough PARAS, MD                                 Medical Decision Making Amount and/or Complexity of  Data Reviewed Labs: ordered. Radiology: ordered. ECG/medicine tests: ordered.  Risk Prescription drug management.   This patient presents to the ED with concern for orders of breath, chest pain,. This involves an extensive number of treatment options, and is a complaint that carries with it a high risk of complications and morbidity.  The differential diagnosis includes PNA versus pneumothorax with pulmonary embolism versus anemia versus other  Co-morbidities that complicate the patient evaluation: History of immobilization and recent surgery at risk of thromboembolism  Additional history obtained from EMS, family members at bedside  External records from outside source obtained and reviewed including spittle discharge summary this week  I ordered and personally interpreted labs.  The pertinent results include: White blood cell count elevated at 25.9.  Remainder labs are pending at the time of signout  I ordered imaging studies including x-ray of chest, CT PE study I independently visualized and interpreted imaging which showed potential right-sided infiltrate, PE study pending I agree with the radiologist interpretation  The patient was maintained on a cardiac monitor.  I personally viewed and interpreted the cardiac monitored which showed an underlying rhythm of: Sinus tachycardia  Per my interpretation the patient's ECG shows sinus tachycardia without acute ischemic  findings  I ordered medication including fluid bolus for dehydration, hypotension, IM Toradol  for pain, IV antibiotics for potential community pneumonia  I have reviewed the patients home medicines and have made adjustments as needed  She is on 4L [place] with SPO2 > 90%.     Dispostion:  Patient is signed out to Dr Alm Cave EDP pending follow up on CT imaging and labs and anticipated admission.  She is DNR/DNI - not a candidate for ICU admission.        Final diagnoses:  Hypoxia  Pneumonia due to infectious organism, unspecified laterality, unspecified part of lung    ED Discharge Orders     None          Cottie Donnice PARAS, MD 06/18/24 1529  "

## 2024-06-18 NOTE — ED Notes (Signed)
 Called and made floor aware of PT arrival

## 2024-06-18 NOTE — ED Notes (Signed)
 Called and placed PT on monitor with CCMD

## 2024-06-18 NOTE — ED Provider Notes (Signed)
" °  Physical Exam  BP (!) 111/50   Pulse 81   Temp 99.5 F (37.5 C) (Axillary)   Resp (!) 31   SpO2 93%   Physical Exam  Procedures  Procedures  ED Course / MDM   Clinical Course as of 06/18/24 1722  Wed Jun 18, 2024  1406 EKG per my interpretation shows isolated ST elevation in V3, does not meet STEMI criteria [MT]  1412 No answer Debbie Deiner listed as PoA [MT]  1524 Family at bedside (sisters) CONFIRM DNR/DNI status [MT]    Clinical Course User Index [MT] Trifan, Donnice PARAS, MD   Medical Decision Making Care assumed at 3 PM.  Patient is here with shortness of breath.  Patient had recent right leg surgery.  Signed out pending CTA chest to rule out PE.  Patient does have a leukocytosis of 25 and chest x-ray showed pneumonia and antibiotics was given  5:23 PM I reviewed patient's CT scan and it showed subsegmental PE with no heart strain.  Patient does have pneumonia as well.  Patient's troponin is elevated likely demand ischemia from pneumonia and PE.  Patient started on heparin .  Patient will be admitted for pneumonia, PE  Amount and/or Complexity of Data Reviewed Labs: ordered. Radiology: ordered. ECG/medicine tests: ordered.  Risk Prescription drug management.        "

## 2024-06-18 NOTE — Hospital Course (Addendum)
 R femur x 12/13 > acute DOE  Hypoxia Hypotensive  Leukocytosis CXR > PNA CAT > PE - RH Tropnemin  Abx    #PE without Heart Strain  #AHRF  VBG showed respiratory alkalosis and metabolic acidosis  - PESI score   #ASP PNA  Leukocytosis  RSV negative   #AKI   #Macrocytic anemia   ================ Ida (sister at bedside) who is also her HACPOA - This morning, she was refusing her oral medications  - They could not get her out of bed - Baseline: conversing, able to hold conversation.She is active - no cough,congestion, no sick contacts  - No concerns of dysphagia  - No difficulty breathing in the past few days  - When [place] visited her, she was altered.    ALF in [place] > fall > [facility]   Medications > per facilit  No alcohol , tobacco.

## 2024-06-18 NOTE — H&P (Cosign Needed Addendum)
 " Date: 06/18/2024               Patient Name:  Tanya Roy MRN: [id number]  DOB: 10-06-1928 Age / Sex: 89 y.o., female   PCP: Diedra Lame, MD         Medical Service: Internal Medicine Teaching Service         Attending Physician: Dr. Jone Dauphin      First Contact: Rebecka Pion, DO}    Second Contact: Dr. Toma Edwards, DO         Pager Information: First Contact Pager: [phone]   Second Contact Pager: [phone]   SUBJECTIVE   Chief Complaint: Hypoxia  History of Present Illness: Tanya Roy is a 89 y.o. female with PMH of hypertension, hyperlipidemia, CAD, CHF, osteoporosis, dementia, prior history of intertrochanteric femur fracture that underwent cephalomedullary rodding in 2018, recently Right femoral fracture status post ORIF 06/07/2024, discharged to [facility] rehab, presents today for hypoxia and altered mental status.  History is obtained from patient's sister, Tanya Roy, who is also patient's healthcare power of attorney  Tanya Roy bedside reports that she received a call this morning from the [facility] facility about patient refusing medication, altered mental status.  Facility staff had reported that they were unable to get her out of bed (usually able to get her on a wheelchair) and she was altered. Tanya Roy reports that she saw the patient yesterday, her baseline is where she is able to hold conversation, chirpy and happy, participate in activities. Tanya Roy reports that facility denied any recent cough, congestion, flulike symptoms from the patient.  They denied any sick contacts.  They denied any concerns of dysphagia or difficulty breathing in the past few days.  Tanya Roy also shares that patient was living at assisted living facility in [place] prior to her fall couple of weeks ago, where she was discharged to [facility] rehab.  Tanya Roy shares that she does not want her sister to suffer any longer, she does not want heroic measures such as intubation, chest compressions,  escalation to ICU level of care.  Tanya Roy was teary on exam, reported that she does not want her sister to be poked and prodded anymore.  Tanya Roy reports that her sister has lived a good 92 years of her life, and if it is her time for her to go she wants her to go peacefully. Upon hearing about multiple small PE and concerns for PNA and patient's presentation of moaning and groaning, not following commands; responsive to painful stimuli. She wants to focus on comfort care where her sister (patient) is at peace, pain free and treated with compassion and dignity.  ED Course: Labs significant for bicarb 16, creatinine 1.40, BUN 48, D-dimer 5.48, troponin 491, WBC 25.9, hemoglobin 10.8, VBG showed pH 7.5, pCO2 18.8, bicarb 14.6, pO2 69. Imaging CT angio showed a couple of small segmental/subsegmental pulmonary emboli within the lateral right middle lobe, no right heart strain.  Also showed patchy ground glass opacities scattered throughout the lungs with more airspace consolidation in the superior segment of the right lower lobe and bibasilar segment of the left lower lobe worrisome for multifocal pneumonia or aspiration. Received 1 L NS, finish the second liter NS while in the patient room.  Received ceftriaxone , azithromycin , IM ketorolac , started on IV heparin  bolus plus infusion  ROS:  Unable to evaluate, patient is moaning and groaning, does not answer questions or follow commands.  Meds:  Medication list on 12/17 discharge; will need to confirm tomorrow from the  [facility] facility Tylenol  1000 mg every 8 hours Aspirin  81 mg daily Atorvastatin  20 mg daily B12 daily Calcium  600 oral twice daily Carvedilol  6.25 mg twice daily Ensures twice daily Entresto  24/26 mg twice daily Mirtazapine  7.5 mg at bedtime Oxycodone  5 mg as needed for pain MiraLAX   Active Medications[1]  Past Medical History Current Outpatient Medications  Medication Instructions   acetaminophen  (TYLENOL ) 1,000 mg, Oral, Every 8 hours    aspirin  81 mg, Oral, 2 times daily   atorvastatin  (LIPITOR ) 20 mg, Oral, Daily at bedtime   Calcium  Carbonate-Vitamin D  (CALCIUM  600-D PO) 1 tablet, Oral, 2 times daily   carvedilol  (COREG ) 6.25 MG tablet TAKE 1 TABLET BY MOUTH 2 TIMES PER DAY   Cyanocobalamin (B-12) 1000 MCG CAPS 1 capsule, Oral, Daily   ENTRESTO  24-26 MG TAKE 1 TABLE BY MOUTH 2 TIMES DAILY   mirtazapine  (REMERON ) 7.5 mg, Oral, Daily at bedtime   Nutritional Supplements (ENSURE ORIGINAL) LIQD 1 Bottle, Oral, 2 times daily   oxyCODONE  (OXY IR/ROXICODONE ) 2.5-5 mg, Oral, Every 4 hours PRN   polyethylene glycol (MIRALAX  / GLYCOLAX ) 17 g, Oral, Daily    Past Surgical History Past Surgical History:  Procedure Laterality Date   APPENDECTOMY     BREAST BIOPSY Right 06/27/2011   stereo, neg   CARDIAC CATHETERIZATION     CORONARY STENT INTERVENTION N/A 03/14/2021   Procedure: CORONARY STENT INTERVENTION;  Surgeon: Darron Deatrice LABOR, MD;  Location: [facility] INVASIVE CV LAB;  Service: Cardiovascular;  Laterality: N/A;   CORONARY/GRAFT ACUTE MI REVASCULARIZATION N/A 02/14/2021   Procedure: Coronary/Graft Acute MI Revascularization;  Surgeon: Darron Deatrice LABOR, MD;  Location: [facility] INVASIVE CV LAB;  Service: Cardiovascular;  Laterality: N/A;   FEMUR IM NAIL Right 08/29/2016   Procedure: INTRAMEDULLARY (IM) NAIL FEMORAL;  Surgeon: Ozell Flake, MD;  Location: [facility] ORS;  Service: Orthopedics;  Laterality: Right;   LEFT HEART CATH AND CORONARY ANGIOGRAPHY N/A 02/14/2021   Procedure: LEFT HEART CATH AND CORONARY ANGIOGRAPHY;  Surgeon: Darron Deatrice LABOR, MD;  Location: [facility] INVASIVE CV LAB;  Service: Cardiovascular;  Laterality: N/A;   MELANOMA EXCISION Right 03/26/2015   Procedure: MELANOMA EXCISION;  Surgeon: Carlin Pastel, MD;  Location: [facility] ORS;  Service: General;  Laterality: Right;   ORIF FEMUR FRACTURE Right 06/07/2024   Procedure: OPEN REDUCTION INTERNAL FIXATION (ORIF) DISTAL FEMUR FRACTURE;  Surgeon: Georgina Ozell LABOR, MD;   Location: [facility] OR;  Service: Orthopedics;  Laterality: Right;   ORIF WRIST FRACTURE Right 08/29/2016   Procedure: OPEN REDUCTION INTERNAL FIXATION (ORIF) WRIST FRACTURE;  Surgeon: Ozell Flake, MD;  Location: [facility] ORS;  Service: Orthopedics;  Laterality: Right;   SKIN BIOPSY  03/11/2015   Back     Social:  Presenting from [facility] Support: Sister, Tanya Roy who is also her healthcare power of attorney Level of Function: Wheelchair-bound presenting from [facility] facility PCP:  Diedra Lame, MD  Substances: -Tobacco: None -Alcohol : None -Recreational Drug: None none  Family History:  Family History  Problem Relation Age of Onset   Tuberculosis Mother    Heart disease Father    Breast cancer Neg Hx      Allergies: Allergies as of 06/18/2024   (No Known Allergies)    Review of Systems: A complete ROS was negative except as per HPI.   OBJECTIVE:   Physical Exam: Blood pressure (!) 88/39, pulse 91, temperature 98.1 F (36.7 C), temperature source Oral, resp. rate (!) 27, SpO2 93%.  Constitutional: Appears acutely ill, moaning and groaning, eyes closed, labored breathing.  HENT: AT/Laureles/pinpoint pupils reactive to light.  Eyes: conjunctiva non-erythematous Cardiovascular: Regular rate Pulmonary/Chest: Labored breathing, decreased breath sounds bibasilar lung fields Abdominal: Soft, nondistended MSK: Edematous right lower extremity (site of ORIF), with linear ecchymosis noted along the suture site; upon deep palpation, patient would withdraw to pain Neurological: Somnolent, moaning and groaning, does not follow commands, does not open eyes, withdraws to pain Skin: Extremities cold to touch Psych: Unable to evaluate  Labs: CBC    Component Value Date/Time   WBC 25.9 (H) 06/18/2024 1353   RBC 3.16 (L) 06/18/2024 1353   HGB 11.2 (L) 06/18/2024 1445   HGB 14.9 03/04/2021 1152   HCT 33.0 (L) 06/18/2024 1445   HCT 44.9 03/04/2021 1152   PLT 296 06/18/2024 1353   PLT 243  03/04/2021 1152   MCV 104.4 (H) 06/18/2024 1353   MCV 97 03/04/2021 1152   MCH 34.2 (H) 06/18/2024 1353   MCHC 32.7 06/18/2024 1353   RDW 19.0 (H) 06/18/2024 1353   RDW 12.7 03/04/2021 1152   LYMPHSABS 0.4 (L) 06/18/2024 1353   LYMPHSABS 1.2 03/04/2021 1152   MONOABS 0.7 06/18/2024 1353   EOSABS 0.0 06/18/2024 1353   EOSABS 0.2 03/04/2021 1152   BASOSABS 0.1 06/18/2024 1353   BASOSABS 0.1 03/04/2021 1152     CMP     Component Value Date/Time   NA 144 06/18/2024 1445   NA 142 05/02/2021 0959   K 3.8 06/18/2024 1445   CL 111 06/18/2024 1353   CO2 16 (L) 06/18/2024 1353   GLUCOSE 95 06/18/2024 1353   BUN 48 (H) 06/18/2024 1353   BUN 19 05/02/2021 0959   CREATININE 1.40 (H) 06/18/2024 1353   CALCIUM  8.5 (L) 06/18/2024 1353   PROT 5.9 (L) 08/23/2023 0850   ALBUMIN  2.8 (L) 08/23/2023 0850   AST 42 (H) 08/23/2023 0850   ALT 25 08/23/2023 0850   ALKPHOS 63 08/23/2023 0850   BILITOT 0.4 08/23/2023 0850   GFRNONAA 35 (L) 06/18/2024 1353   GFRAA >60 08/30/2016 0413    Imaging: CT Angio Chest PE W and/or Wo Contrast Result Date: 06/18/2024 EXAM: CTA CHEST 06/18/2024 04:26:39 PM TECHNIQUE: CTA of the chest was performed with and without the administration of intravenous contrast. Multiplanar reformatted images are provided for review. MIP images are provided for review. Automated exposure control, iterative reconstruction, and/or weight based adjustment of the mA/kV was utilized to reduce the radiation dose to as low as reasonably achievable. COMPARISON: 06/18/2024, 08/23/2023 CLINICAL HISTORY: Pulmonary embolism (PE) suspected, high probability. FINDINGS: Trifan who verbally acknowledged these results. PULMONARY ARTERIES: Pulmonary arteries are adequately opacified for evaluation. Segmental and subsegmental emboli within the lateral right middle lobe. Main pulmonary artery is normal in caliber. MEDIASTINUM: The heart and pericardium demonstrate no acute abnormality. No findings of right  heart strain. There is no acute abnormality of the thoracic aorta. LYMPH NODES: No mediastinal, hilar or axillary lymphadenopathy. LUNGS AND PLEURA: Patchy ground glass opacity scattered throughout the lungs with more dependent airspace consolidation in the superior segment of the right lower lobe and the basilar segment of the left lower lobe. Small left pleural effusion. No pneumothorax. UPPER ABDOMEN: Limited images of the upper abdomen are unremarkable. SOFT TISSUES AND BONES: Diffuse osteopenia. Multilevel degenerative disc disease of the spine. Remote, posttraumatic deformity of the right humeral neck. Chronic compression fractures in the mid thoracic spine. No acute soft tissue abnormality. IMPRESSION: 1. A couple of small segmental /subsegmental pulmonary emboli within the lateral right middle lobe. No findings of  right heart strain. 2. Patchy ground glass opacity scattered throughout the lungs with more dependent airspace consolidation in the superior segment of the right lower lobe and the basilar segment of the left lower lobe, worrisome for multifocal pneumonia or aspiration. 3. Small left pleural effusion. Critical value/emergent results were called by telephone at the time of interpretation on 06/18/2024 at 4:44 PM to provider Dr. Alm Cave, who verbally acknowledged receipt of these results. Electronically signed by: Rogelia Myers MD 06/18/2024 04:50 PM EST RP Workstation: [id number]   DG Chest Portable 1 View Result Date: 06/18/2024 EXAM: 1 VIEW(S) XRAY OF THE CHEST 06/18/2024 02:20:43 PM COMPARISON: 08/23/2023 CLINICAL HISTORY: SOB FINDINGS: LUNGS AND PLEURA: Emphysematous lungs. New Bilateral patchy airspace disease, most dense in upper lobes, left worse than right. No pleural effusion. No pneumothorax. HEART AND MEDIASTINUM: Cardiomegaly. Calcified and tortuous aorta. Coronary artery stents noted. BONES AND SOFT TISSUES: Osteopenia, scoliosis and degenerative changes of spine. Multiple  chronic compression fractures in midthoracic spine. IMPRESSION: 1. New bilateral patchy airspace disease, most dense in the upper lobes, left worse than right. 2. Emphysematous lungs. 3. Cardiomegaly. Electronically signed by: Dayne Hassell MD 06/18/2024 02:58 PM EST RP Workstation: [id number]     EKG: personally reviewed my interpretation is sinus rhythm, PR 129 similar to prior EKGs  ASSESSMENT & PLAN:   Assessment & Plan by Problem: Principal Problem:   Pulmonary embolism (HCC)   Tanya Roy is a 89 y.o. person living with a history of  hypertension, hyperlipidemia, CAD, CHF, osteoporosis, dementia, prior history of intertrochanteric femur fracture that underwent cephalomedullary rodding in 2018, recent.  Right femoral fracture status post ORIF 06/07/2024, discharged to [facility], presents today for hypoxia and altered mental status.  Acute provoked segmental/subsegmental pulmonary emboli's right middle lobe Multifocal pneumonia versus aspiration pneumonia Respiratory alkalosis and metabolic acidosis Acute kidney injury Leukocytosis Elevated troponin levels, demand ischemia Altered mental status NOW COMFORT CARE > PENDING inpatient Hospice  This is a 89 year old female with pertinent past medical history of osteoporosis, diastolic heart failure, prior history of intertrochanteric femur fracture that underwent cephalomedullary rodding in 2018, most recently Right femoral fracture status post ORIF 06/07/2024, discharged to [facility] rehab, presents today for hypoxia and altered mental status.  Labs were pertinent for Creatinine 1.40, BUN 48, D-dimer 5.48, troponin 491, WBC 25.9, hemoglobin 10.8, VBG showed pH 7.5, pCO2 18.8, bicarb 14.6, pO2 69.  Patient's D-dimer level 5.48 > CTA findings couple of small pulmonary emboli in the right middle lobe in addition to multifocal pneumonia versus aspiration pneumonia.  Initial high-sensitivity troponin 491, likely demand ischemia.  CTA  did not show evidence of right heart strain.  Patient was appropriately started on heparin  bolus and drip, received ceftriaxone , azithromycin  and IM ketorolac  for pain management.  Upon evaluation, patient was somnolent, moaning and groaning, with labored breathing, did not follow commands, withdrew to pain.  Patient's extremities were cold to touch. Patient had completed 2 L NS. During evaluation, her SBP 80s and MAPs low 60s - 50s. Patient 's sister Tanya Roy, who reports that she is the HPOA, reports that she does not want her Tanya Roy (patient) to suffer any longer. Tanya Roy was teary on exam states that she can not see her get poked and prodded for labs, she wanted to stop those. She reports that her sister has lived a good life among friends and family. Tanya Roy said that she wants Tanya Roy (patient) to be comfortable and pain free. Tanya Roy says that if its her time to pass away,  she would like her to pass away comfortably, pain free and peacefully. Tanya Roy states that she does not want heroic measures such as chest compressions, intubation, escalating level of care to ICU, pressors.  Tanya Roy states that she wants her sister to be comfortable without repeated sticks for labs and other interventions; states that she wanted her sister to be comfort care and transition to hospice facility near to [place] where family is close by.  She understands that if we will be stopping antibiotics and blood thinners.  She reports that she wants her sister (patient) to get IV pain medications for pain control and keep her comfortable.   Plan: - Comfort care with comfort measures order set  - Pain modality: IV Dilaudid  every 30 minutes as needed for severe pain  - As needed Robinul  for excessive secretions  - As needed Haldol  for agitation  - As needed Ativan  for anxiety  - As needed albuterol  and supplemental oxygen  - As needed artificial tears  - As needed Zofran  for nausea and vomiting - Stop IV heparin , no antibiotics - TOC for hospice -  Palliative care consulted - Chaplain consulted  Best practice: Diet: NPO VTE: None  IVF: None Code: Further care  Disposition planning: Prior to Admission Living Arrangement: SNF, [facility] Anticipated Discharge Location: Hospice near [place]  Dispo: Admit patient to Observation with expected length of stay less than 2 midnights.  Signed: Heddy Barren, DO Internal Medicine Resident  06/18/2024, 7:41 PM  On Call pager: [phone]      [1]  No outpatient medications have been marked as taking for the 06/18/24 encounter [facility] Encounter).   "

## 2024-06-18 NOTE — Progress Notes (Signed)
 PHARMACY - ANTICOAGULATION CONSULT NOTE  Pharmacy Consult for Heparin  Indication: pulmonary embolus  Allergies[1]  Patient Measurements:    Vital Signs: Temp: 99.5 F (37.5 C) (12/24 1358) Temp Source: Axillary (12/24 1358) BP: 111/50 (12/24 1445) Pulse Rate: 81 (12/24 1445)  Labs: Recent Labs    06/18/24 1353 06/18/24 1445  HGB 10.8* 11.2*  HCT 33.0* 33.0*  PLT 296  --   CREATININE 1.40*  --     Estimated Creatinine Clearance: 20.8 mL/min (A) (by C-G formula based on SCr of 1.4 mg/dL (H)).   Medical History: Past Medical History:  Diagnosis Date   Abnormal mammogram of right breast 06/26/2010   Microcalcifications   Arthritis    Confusion 02/14/2021   start before STEMI acc. to sister [place]   Myocardial infarction [facility])    Osteoporosis 2020    Medications:  Infusions:   azithromycin  (ZITHROMAX ) 500 mg in sodium chloride  0.9 % 250 mL IVPB 500 mg (06/18/24 1706)   cefTRIAXone  (ROCEPHIN )  IV 2 g (06/18/24 1707)    Assessment: 89 yof with chest pressure and pain. Pharmacy consulted for heparin  infusion for PE. Patient does not appear to be on blood thinners prior to admission.  Goal of Therapy:  Heparin  level 0.3-0.7 units/ml Monitor platelets by anticoagulation protocol: Yes   Plan:  Give 3000 units bolus x 1 Start heparin  infusion at 800 units/hr Check anti-Xa level in 8 hours and daily while on heparin  Continue to monitor H&H and platelets  Kadon Andrus M Billee Balcerzak 06/18/2024,5:20 PM      [1] No Known Allergies

## 2024-06-18 NOTE — ED Notes (Signed)
 I tried to obtain pt's blood and get an IV in, but was unsuccessful

## 2024-06-19 DIAGNOSIS — I252 Old myocardial infarction: Secondary | ICD-10-CM | POA: Diagnosis not present

## 2024-06-19 DIAGNOSIS — Z85820 Personal history of malignant melanoma of skin: Secondary | ICD-10-CM | POA: Diagnosis not present

## 2024-06-19 DIAGNOSIS — Z1152 Encounter for screening for COVID-19: Secondary | ICD-10-CM | POA: Diagnosis not present

## 2024-06-19 DIAGNOSIS — R0902 Hypoxemia: Secondary | ICD-10-CM | POA: Diagnosis present

## 2024-06-19 DIAGNOSIS — I5032 Chronic diastolic (congestive) heart failure: Secondary | ICD-10-CM | POA: Diagnosis present

## 2024-06-19 DIAGNOSIS — Z66 Do not resuscitate: Secondary | ICD-10-CM | POA: Diagnosis present

## 2024-06-19 DIAGNOSIS — I2699 Other pulmonary embolism without acute cor pulmonale: Secondary | ICD-10-CM

## 2024-06-19 DIAGNOSIS — Z79899 Other long term (current) drug therapy: Secondary | ICD-10-CM | POA: Diagnosis not present

## 2024-06-19 DIAGNOSIS — E785 Hyperlipidemia, unspecified: Secondary | ICD-10-CM | POA: Diagnosis present

## 2024-06-19 DIAGNOSIS — Z993 Dependence on wheelchair: Secondary | ICD-10-CM | POA: Diagnosis not present

## 2024-06-19 DIAGNOSIS — Z7982 Long term (current) use of aspirin: Secondary | ICD-10-CM | POA: Diagnosis not present

## 2024-06-19 DIAGNOSIS — E874 Mixed disorder of acid-base balance: Secondary | ICD-10-CM | POA: Diagnosis present

## 2024-06-19 DIAGNOSIS — I2693 Single subsegmental pulmonary embolism without acute cor pulmonale: Secondary | ICD-10-CM | POA: Diagnosis present

## 2024-06-19 DIAGNOSIS — J9601 Acute respiratory failure with hypoxia: Secondary | ICD-10-CM | POA: Diagnosis present

## 2024-06-19 DIAGNOSIS — I2489 Other forms of acute ischemic heart disease: Secondary | ICD-10-CM | POA: Diagnosis present

## 2024-06-19 DIAGNOSIS — M1990 Unspecified osteoarthritis, unspecified site: Secondary | ICD-10-CM | POA: Diagnosis present

## 2024-06-19 DIAGNOSIS — I2510 Atherosclerotic heart disease of native coronary artery without angina pectoris: Secondary | ICD-10-CM | POA: Diagnosis present

## 2024-06-19 DIAGNOSIS — I110 Hypertensive heart disease with heart failure: Secondary | ICD-10-CM | POA: Diagnosis present

## 2024-06-19 DIAGNOSIS — Z9049 Acquired absence of other specified parts of digestive tract: Secondary | ICD-10-CM | POA: Diagnosis not present

## 2024-06-19 DIAGNOSIS — J189 Pneumonia, unspecified organism: Secondary | ICD-10-CM

## 2024-06-19 DIAGNOSIS — Z8249 Family history of ischemic heart disease and other diseases of the circulatory system: Secondary | ICD-10-CM | POA: Diagnosis not present

## 2024-06-19 DIAGNOSIS — N179 Acute kidney failure, unspecified: Secondary | ICD-10-CM | POA: Diagnosis present

## 2024-06-19 DIAGNOSIS — Z515 Encounter for palliative care: Principal | ICD-10-CM

## 2024-06-19 DIAGNOSIS — F0390 Unspecified dementia without behavioral disturbance: Secondary | ICD-10-CM | POA: Diagnosis present

## 2024-06-19 DIAGNOSIS — M810 Age-related osteoporosis without current pathological fracture: Secondary | ICD-10-CM | POA: Diagnosis present

## 2024-06-19 DIAGNOSIS — J690 Pneumonitis due to inhalation of food and vomit: Secondary | ICD-10-CM | POA: Diagnosis present

## 2024-06-19 MED ORDER — HYDROMORPHONE HCL 1 MG/ML IJ SOLN
0.5000 mg | INTRAMUSCULAR | Status: DC | PRN
  Administered 2024-06-19 (×2): 2 mg via INTRAVENOUS
  Administered 2024-06-19 (×2): 1 mg via INTRAVENOUS
  Filled 2024-06-19: qty 2
  Filled 2024-06-19: qty 1
  Filled 2024-06-19: qty 2
  Filled 2024-06-19: qty 1

## 2024-06-26 NOTE — Progress Notes (Signed)
 Patient's time of death pronounced by Michelina, RN at [phone] Family is bedside. [facility] has been called. Post Mortem flowsheet is complete. Will perform post mortem care with two nurse techs present to assist

## 2024-06-26 NOTE — Progress Notes (Signed)
 "  HD#0 SUBJECTIVE:  Patient Summary: Tanya Roy is a 89 y.o. female with PMH of hypertension, hyperlipidemia, CAD, CHF, osteoporosis, dementia, prior history of intertrochanteric femur fracture that underwent cephalomedullary rodding in 2018, recently Right femoral fracture status post ORIF 06/07/2024, discharged to [facility] rehab, presents with hypoxia and AMS and admitted for PE and CAP .   Overnight Events: None   Interim History:  Saw patient at bedside this a.m.  Sister, Tanya Roy, at bedside.  Patient was sleeping comfortably in bed with no apparent discomfort.  Sister reports patient was in NAD currently.  OBJECTIVE:  Vital Signs: Vitals:   06/18/24 2030 06/18/24 2100 2024/07/13 0436 2024/07/13 0624  BP: (!) 85/43 (!) 114/52 (!) 113/45   Pulse: 83 (!) 101 85 90  Resp: (!) 21 20  (!) 25  Temp:  98.6 F (37 C)    TempSrc:  Axillary    SpO2: 97% 95%  (!) 78%   Supplemental O2: Room Air SpO2: (!) 78 % O2 Flow Rate (L/min): 2 L/min  There were no vitals filed for this visit.   Intake/Output Summary (Last 24 hours) at Jul 13, 2024 1040 Last data filed at 06/18/2024 1847 Gross per 24 hour  Intake 1342.65 ml  Output --  Net 1342.65 ml   Net IO Since Admission: 1,342.65 mL [13-Jul-2024 1040]  Physical Exam: Physical Exam Constitutional:      Comments: Patient was comfortably sleeping in bed.  In NAD.     Patient Lines/Drains/Airways Status     Active Line/Drains/Airways     Name Placement date Placement time Site Days   Peripheral IV 06/18/24 20 G 1.88 Right Antecubital 06/18/24  1555  Antecubital  1   Wound 06/07/24 1646 Surgical Closed Surgical Incision Thigh Right 06/07/24  1646  Thigh  12   Wound 06/10/24 2217  Arm Lower;Proximal;Right;Posterior 06/10/24  2217  Arm  9   Wound 06/10/24 2300 Other (Comment) Sacrum 06/10/24  2300  Sacrum  9   Wound 06/18/24 2100 Surgical Closed Surgical Incision Thigh Anterior;Proximal;Right;Lateral 06/18/24  2100  Thigh  1             Pertinent labs and imaging:      Latest Ref Rng & Units 06/18/2024    2:45 PM 06/18/2024    1:53 PM 06/11/2024    3:57 AM  CBC  WBC 4.0 - 10.5 K/uL  25.9  8.8   Hemoglobin 12.0 - 15.0 g/dL 88.7  89.1  9.2   Hematocrit 36.0 - 46.0 % 33.0  33.0  27.0   Platelets 150 - 400 K/uL  296  129        Latest Ref Rng & Units 06/18/2024    2:45 PM 06/18/2024    1:53 PM 06/10/2024    5:54 AM  CMP  Glucose 70 - 99 mg/dL  95  94   BUN 8 - 23 mg/dL  48  32   Creatinine 9.55 - 1.00 mg/dL  8.59  9.30   Sodium 864 - 145 mmol/L 144  143  140   Potassium 3.5 - 5.1 mmol/L 3.8  4.0  4.0   Chloride 98 - 111 mmol/L  111  108   CO2 22 - 32 mmol/L  16  28   Calcium  8.9 - 10.3 mg/dL  8.5  8.3     CT Angio Chest PE W and/or Wo Contrast Result Date: 06/18/2024 EXAM: CTA CHEST 06/18/2024 04:26:39 PM TECHNIQUE: CTA of the chest was performed with and without the  administration of intravenous contrast. Multiplanar reformatted images are provided for review. MIP images are provided for review. Automated exposure control, iterative reconstruction, and/or weight based adjustment of the mA/kV was utilized to reduce the radiation dose to as low as reasonably achievable. COMPARISON: 06/18/2024, 08/23/2023 CLINICAL HISTORY: Pulmonary embolism (PE) suspected, high probability. FINDINGS: Trifan who verbally acknowledged these results. PULMONARY ARTERIES: Pulmonary arteries are adequately opacified for evaluation. Segmental and subsegmental emboli within the lateral right middle lobe. Main pulmonary artery is normal in caliber. MEDIASTINUM: The heart and pericardium demonstrate no acute abnormality. No findings of right heart strain. There is no acute abnormality of the thoracic aorta. LYMPH NODES: No mediastinal, hilar or axillary lymphadenopathy. LUNGS AND PLEURA: Patchy ground glass opacity scattered throughout the lungs with more dependent airspace consolidation in the superior segment of the right lower lobe  and the basilar segment of the left lower lobe. Small left pleural effusion. No pneumothorax. UPPER ABDOMEN: Limited images of the upper abdomen are unremarkable. SOFT TISSUES AND BONES: Diffuse osteopenia. Multilevel degenerative disc disease of the spine. Remote, posttraumatic deformity of the right humeral neck. Chronic compression fractures in the mid thoracic spine. No acute soft tissue abnormality. IMPRESSION: 1. A couple of small segmental /subsegmental pulmonary emboli within the lateral right middle lobe. No findings of right heart strain. 2. Patchy ground glass opacity scattered throughout the lungs with more dependent airspace consolidation in the superior segment of the right lower lobe and the basilar segment of the left lower lobe, worrisome for multifocal pneumonia or aspiration. 3. Small left pleural effusion. Critical value/emergent results were called by telephone at the time of interpretation on 06/18/2024 at 4:44 PM to provider Dr. Alm Cave, who verbally acknowledged receipt of these results. Electronically signed by: Rogelia Myers MD 06/18/2024 04:50 PM EST RP Workstation: [id number]   DG Chest Portable 1 View Result Date: 06/18/2024 EXAM: 1 VIEW(S) XRAY OF THE CHEST 06/18/2024 02:20:43 PM COMPARISON: 08/23/2023 CLINICAL HISTORY: SOB FINDINGS: LUNGS AND PLEURA: Emphysematous lungs. New Bilateral patchy airspace disease, most dense in upper lobes, left worse than right. No pleural effusion. No pneumothorax. HEART AND MEDIASTINUM: Cardiomegaly. Calcified and tortuous aorta. Coronary artery stents noted. BONES AND SOFT TISSUES: Osteopenia, scoliosis and degenerative changes of spine. Multiple chronic compression fractures in midthoracic spine. IMPRESSION: 1. New bilateral patchy airspace disease, most dense in the upper lobes, left worse than right. 2. Emphysematous lungs. 3. Cardiomegaly. Electronically signed by: Katheleen Faes MD 06/18/2024 02:58 PM EST RP Workstation: [id number]     ASSESSMENT/PLAN:  Assessment: Principal Problem:   Pulmonary embolism (HCC)   Plan: Acute PE Multifocal pneumonia vs aspiration pneumonia Family elected comfort care. Anticipate hospital death. HCPOA elected inpatient EOL care.  Plan: - Comfort care with full comfort measures  order set:             - Dilaudid  as needed pain/dyspnea/increased work of breathing/RR>25 Tylenol  PRN pain/fever Albuterol  as needed wheezing Biotin as needed dry mouth Benadryl  PRN itching Robinul  PRN secretions Haldol  PRN agitation/delirium Ativan  PRN anxiety/seizure/sleep/distress Zofran  PRN nausea/vomiting Liquifilm Tears PRN dry eye - Palliative care on board, appreciate - Inpatient EOL care   Best Practice: Diet: None IVF: Fluids: None, Rate: None VTE: None Code: DNR/DNI  Disposition planning: Prior to Admission Living Arrangement: SNF, [facility] Anticipated Discharge Location: Anticipate hospital day   Dispo: Admit patient to Observation with expected length of stay less than 2 midnights.  Signature:  Rebecka Edgardo [facility]  10:40 AM, 2024/06/20  On Call  pager [phone]  "

## 2024-06-26 NOTE — Death Summary Note (Signed)
 "  DEATH SUMMARY   Patient Details  Name: Tanya Roy MRN: [id number] DOB: 1929/02/10 ERE:Ajajnqq, Jerona, MD  Admission/Discharge Information   Admit Date:  2024/07/01  Date of Death: Date of Death: 02-Jul-2024  Time of Death: Time of Death: 1721/07/05  Length of Stay: 1   Principle Cause of death: AHRF 2/2 right sided subsegmental PE and CAP  Hospital Diagnoses: Principal Problem:   Pulmonary embolism (HCC) Active Problems:   Acute pulmonary embolism [facility])  Hospital Course: Tanya Roy is a 89 y.o. female with PMH of hypertension, hyperlipidemia, CAD, CHF, osteoporosis, dementia, prior history of intertrochanteric femur fracture that underwent cephalomedullary rodding in 05-Jul-2017, recently Right femoral fracture status post ORIF 06/07/2024, discharged to [facility] rehab, presents today for hypoxia and altered mental status.   Acute provoked right-sided segmental/subsegmental PE Multifocal pneumonia versus aspiration pneumonia Respiratory alkalosis and metabolic acidosis AKI Leukocytosis Elevated troponin levels, demand ischemia AMS Patient presented with hypoxia and AMS.  She was accompanied by her sister, Lindajo, who was also her POA.  CTA showed small pulmonary emboli in the right middle lobe in addition to multifocal pneumonia versus aspiration pneumonia.  CTA did not show any evidence of right strain. troponin levels were elevated at 491 which was likely due to demand ischemia.  Patient was started on heparin  drip, CTX, azithromycin  and IM ketorolac  for acute condition treatment and pain management.  Upon our evaluation, patient was somnolent , was moaning groaning, had labored breathing, and was not following commands.  Systolic BP was in 80s and maps lower 60s to 50s.  Explained to sister that we can treat her for acute conditions, however, her outlook from medical standpoint looks very poor.  Sister who is also her POA decided that she wanted to go ahead with comfort  care for the patient and wanted her to transition to hospice care.  Consulted palliative care who was able to discuss further goals of care with patient's family.   HCPOA elected inpatient EOL care.  Patient continued to receive comfort care with comfort measures.  Per charting, she passed away on 07/02/2024 at 1722 with family at bedside.         Procedures: None   Consultations: Palliative care, TOC  The results of significant diagnostics from this hospitalization (including imaging, microbiology, ancillary and laboratory) are listed below for reference.   Significant Diagnostic Studies: CT Angio Chest PE W and/or Wo Contrast Result Date: Jul 01, 2024 EXAM: CTA CHEST 07/01/2024 04:26:39 PM TECHNIQUE: CTA of the chest was performed with and without the administration of intravenous contrast. Multiplanar reformatted images are provided for review. MIP images are provided for review. Automated exposure control, iterative reconstruction, and/or weight based adjustment of the mA/kV was utilized to reduce the radiation dose to as low as reasonably achievable. COMPARISON: 07-01-24, 08/23/2023 CLINICAL HISTORY: Pulmonary embolism (PE) suspected, high probability. FINDINGS: Trifan who verbally acknowledged these results. PULMONARY ARTERIES: Pulmonary arteries are adequately opacified for evaluation. Segmental and subsegmental emboli within the lateral right middle lobe. Main pulmonary artery is normal in caliber. MEDIASTINUM: The heart and pericardium demonstrate no acute abnormality. No findings of right heart strain. There is no acute abnormality of the thoracic aorta. LYMPH NODES: No mediastinal, hilar or axillary lymphadenopathy. LUNGS AND PLEURA: Patchy ground glass opacity scattered throughout the lungs with more dependent airspace consolidation in the superior segment of the right lower lobe and the basilar segment of the left lower lobe. Small left pleural effusion. No pneumothorax. UPPER ABDOMEN:  Limited images of  the upper abdomen are unremarkable. SOFT TISSUES AND BONES: Diffuse osteopenia. Multilevel degenerative disc disease of the spine. Remote, posttraumatic deformity of the right humeral neck. Chronic compression fractures in the mid thoracic spine. No acute soft tissue abnormality. IMPRESSION: 1. A couple of small segmental /subsegmental pulmonary emboli within the lateral right middle lobe. No findings of right heart strain. 2. Patchy ground glass opacity scattered throughout the lungs with more dependent airspace consolidation in the superior segment of the right lower lobe and the basilar segment of the left lower lobe, worrisome for multifocal pneumonia or aspiration. 3. Small left pleural effusion. Critical value/emergent results were called by telephone at the time of interpretation on 06/18/2024 at 4:44 PM to provider Dr. Alm Cave, who verbally acknowledged receipt of these results. Electronically signed by: Rogelia Myers MD 06/18/2024 04:50 PM EST RP Workstation: [id number]   DG Chest Portable 1 View Result Date: 06/18/2024 EXAM: 1 VIEW(S) XRAY OF THE CHEST 06/18/2024 02:20:43 PM COMPARISON: 08/23/2023 CLINICAL HISTORY: SOB FINDINGS: LUNGS AND PLEURA: Emphysematous lungs. New Bilateral patchy airspace disease, most dense in upper lobes, left worse than right. No pleural effusion. No pneumothorax. HEART AND MEDIASTINUM: Cardiomegaly. Calcified and tortuous aorta. Coronary artery stents noted. BONES AND SOFT TISSUES: Osteopenia, scoliosis and degenerative changes of spine. Multiple chronic compression fractures in midthoracic spine. IMPRESSION: 1. New bilateral patchy airspace disease, most dense in the upper lobes, left worse than right. 2. Emphysematous lungs. 3. Cardiomegaly. Electronically signed by: Dayne Hassell MD 06/18/2024 02:58 PM EST RP Workstation: [id number]   DG FEMUR, MIN 2 VIEWS RIGHT Result Date: 06/07/2024 CLINICAL DATA:  [id number] Surgery, elective [id number] EXAM: RIGHT  FEMUR 2 VIEWS COMPARISON:  June 06, 2024 FINDINGS: Spot fluoroscopy images were obtained for surgical planning purposes. This demonstrates placement of an additional screw and plate fixation along the distal femur with corresponding improved alignment of the distal femoral fracture. Status post intramedullary rod fixation of the RIGHT femur. Time: 120 second Dose: 9.42 mGy Please reference procedure report for further details. IMPRESSION: Spot fluoroscopy images obtained for surgical planning purposes. Electronically Signed   By: Corean Salter M.D.   On: 06/07/2024 18:11   DG C-Arm 1-60 Min-No Report Result Date: 06/07/2024 Fluoroscopy was utilized by the requesting physician.  No radiographic interpretation.   DG C-Arm 1-60 Min-No Report Result Date: 06/07/2024 Fluoroscopy was utilized by the requesting physician.  No radiographic interpretation.   DG C-Arm 1-60 Min-No Report Result Date: 06/07/2024 Fluoroscopy was utilized by the requesting physician.  No radiographic interpretation.   CT KNEE RIGHT WO CONTRAST Result Date: 06/06/2024 EXAM: CT RIGHT KNEE, WITHOUT IV CONTRAST 06/06/2024 11:11:22 PM TECHNIQUE: Axial images were acquired through the right knee without IV contrast. Reformatted images were reviewed. Automated exposure control, iterative reconstruction, and/or weight based adjustment of the mA/kV was utilized to reduce the radiation dose to as low as reasonably achievable. COMPARISON: Same day x-ray. CLINICAL HISTORY: Right perisprosthetic fracture. FINDINGS: BONES: Spiral minimally displaced periprosthetic fracture about the distal right femoral diaphysis. The fracture line extends into the lateral epicondyle. Suspected chip fracture of the posterior medial epicondyle (series 10 image 71). JOINTS: No dislocation. The lipohemarthrosis suggests intraarticular extension though a discrete fracture line at the articular surface of the knee is not visualized. Moderate  lipohemarthrosis. Chondrocalcinosis in the medial and lateral compartments. SOFT TISSUES: The soft tissues are unremarkable. IMPRESSION: 1. Spiral minimally displaced periprosthetic fracture about the distal right femoral diaphysis. 2. Suspected chip fracture of the posterior medial epicondyle. 3. Lipohemarthrosis suggests intraarticular  extension, though a discrete fracture line at the articular surface is not visualized. Electronically signed by: Norman Gatlin MD 06/06/2024 11:33 PM EST RP Workstation: [id number]   CT Cervical Spine Wo Contrast Result Date: 06/06/2024 EXAM: CT CERVICAL SPINE WITHOUT CONTRAST 06/06/2024 09:21:13 PM TECHNIQUE: CT of the cervical spine was performed without the administration of intravenous contrast. Multiplanar reformatted images are provided for review. Automated exposure control, iterative reconstruction, and/or weight based adjustment of the mA/kV was utilized to reduce the radiation dose to as low as reasonably achievable. COMPARISON: None available. CLINICAL HISTORY: Neck trauma (Age >= 65y) Neck trauma (Age >= 65y) FINDINGS: BONES AND ALIGNMENT: No acute fracture or traumatic malalignment. DEGENERATIVE CHANGES: No significant degenerative changes. SOFT TISSUES: No prevertebral soft tissue swelling. IMPRESSION: 1. No significant abnormality Electronically signed by: Oneil Devonshire MD 06/06/2024 09:27 PM EST RP Workstation: [id number]   CT Head Wo Contrast Result Date: 06/06/2024 EXAM: CT HEAD WITHOUT 06/06/2024 09:21:13 PM TECHNIQUE: CT of the head was performed without the administration of intravenous contrast. Automated exposure control, iterative reconstruction, and/or weight based adjustment of the mA/kV was utilized to reduce the radiation dose to as low as reasonably achievable. COMPARISON: 08/23/2023 CLINICAL HISTORY: Head trauma, minor (Age >= 65y) FINDINGS: BRAIN AND VENTRICLES: No acute intracranial hemorrhage. No mass effect or midline shift. No extra-axial  fluid collection. No evidence of acute infarct. ORBITS: Bilateral lens replacement noted. SINUSES AND MASTOIDS: No acute abnormality. SOFT TISSUES AND SKULL: No acute skull fracture. No acute soft tissue abnormality. IMPRESSION: 1. No acute intracranial abnormality. Electronically signed by: Oneil Devonshire MD 06/06/2024 09:26 PM EST RP Workstation: [id number]   DG Hip Unilat W or Wo Pelvis 2-3 Views Right Result Date: 06/06/2024 EXAM: 2 VIEW(S) XRAY OF THE PELVIS AND RIGHT HIP 06/06/2024 09:19:00 PM COMPARISON: None available. CLINICAL HISTORY: Fall FINDINGS: BONES AND JOINTS: SI joints are symmetric. Right femur: Intramedullary rod and hip screw are in place. There is an acute distal femoral fracture with 1 shaft width lateral displacement of the distal fracture fragment. Pelvis: Remote healed right superior and inferior pubic rami fractures are noted. Hips: Mild symmetrical bilateral hip osteoarthritis is present. Bilateral hips demonstrate normal alignment. SOFT TISSUES: The soft tissues are unremarkable. IMPRESSION: 1. Acute distal femoral fracture with one shaft width lateral displacement of the distal fragment. 2. Intramedullary rod and hip screw in the right femur. 3. Remote healed right superior and inferior pubic rami fractures. Electronically signed by: Oneil Devonshire MD 06/06/2024 09:25 PM EST RP Workstation: [id number]   DG Knee Complete 4 Views Right Result Date: 06/06/2024 EXAM: 4 VIEW(S) XRAY OF THE RIGHT KNEE 06/06/2024 09:19:00 PM COMPARISON: None available. CLINICAL HISTORY: Fall FINDINGS: BONES AND JOINTS: Intramedullary rod in femur with distal interlocking screw. Oblique fracture through the mid to distal femur, at the level of the distal intramedullary rod. There is apex anterior angulation. No malalignment. Small joint effusion within the knee. SOFT TISSUES: The soft tissues are unremarkable. IMPRESSION: 1. Oblique fracture through the mid to distal femur at the level of the distal  intramedullary rod with apex anterior angulation. 2. Small joint effusion within the knee. Electronically signed by: Greig Pique MD 06/06/2024 09:23 PM EST RP Workstation: [id number]    Microbiology: Recent Results (from the past 240 hours)  Resp panel by RT-PCR (RSV, Flu A&B, Covid) Anterior Nasal Swab     Status: None   Collection Time: 06/18/24  2:25 PM   Specimen: Anterior Nasal Swab  Result Value Ref Range Status   SARS  Coronavirus 2 by RT PCR NEGATIVE NEGATIVE Final   Influenza A by PCR NEGATIVE NEGATIVE Final   Influenza B by PCR NEGATIVE NEGATIVE Final    Comment: (NOTE) The Xpert Xpress SARS-CoV-2/FLU/RSV plus assay is intended as an aid in the diagnosis of influenza from Nasopharyngeal swab specimens and should not be used as a sole basis for treatment. Nasal washings and aspirates are unacceptable for Xpert Xpress SARS-CoV-2/FLU/RSV testing.  Fact Sheet for Patients: bloggercourse.com  Fact Sheet for Healthcare Providers: seriousbroker.it  This test is not yet approved or cleared by the [place]  FDA and has been authorized for detection and/or diagnosis of SARS-CoV-2 by FDA under an Emergency Use Authorization (EUA). This EUA will remain in effect (meaning this test can be used) for the duration of the COVID-19 declaration under Section 564(b)(1) of the Act, 21 U.S.C. section 360bbb-3(b)(1), unless the authorization is terminated or revoked.     Resp Syncytial Virus by PCR NEGATIVE NEGATIVE Final    Comment: (NOTE) Fact Sheet for Patients: bloggercourse.com  Fact Sheet for Healthcare Providers: seriousbroker.it  This test is not yet approved or cleared by the [place]  FDA and has been authorized for detection and/or diagnosis of SARS-CoV-2 by FDA under an Emergency Use Authorization (EUA). This EUA will remain in effect (meaning this test can be used)  for the duration of the COVID-19 declaration under Section 564(b)(1) of the Act, 21 U.S.C. section 360bbb-3(b)(1), unless the authorization is terminated or revoked.  Performed at [facility], [address] [place], [place] [address]     Time spent: 30 minutes  Signed: Rebecka Pion, DO    "

## 2024-06-26 NOTE — Consult Note (Signed)
 "                                                                                Consultation Note Date: 07-14-2024   Patient Name: Tanya Roy  DOB: 11/22/1928  MRN: [id number]  Age / Sex: 89 y.o., female  PCP: Tanya Lame, MD Referring Physician: Shawn Sick, MD  Reason for Consultation: Terminal Care, hospice  HPI/Patient Profile: 89 y.o. female  with past medical history of hypertension, hyperlipidemia, CAD, CHF, osteoporosis, dementia, prior history of intertrochanteric femur fracture that underwent cephalomedullary rodding in 2018, recently Right femoral fracture status post ORIF 06/07/2024 and discharged to [facility] rehab presented to ED on 06/14/2024 with hypoxia and altered mental status.  Patient was found to have pulmonary embolism, pneumonia, AKI, respiratory alkalosis and metabolic acidosis, leukocytosis, altered mental status.  After discussions with admitting provider, family opted for patient's transition to full comfort care and admission for end-of-life care.    Of note patient has had 2 admissions and 1 ED visit in the last 6 months.  She is a 7-day readmission.  Clinical Assessment and Goals of Care: I have reviewed medical records including: EPIC notes: EDP, nursing, internal medicine, chaplain.  Patient transition to full comfort care on 12/24. MAR: Comfort medications as ordered per MAR.  As needed medications administered in the last 24 hours -Dilaudid  x 2, Ativan  x 1 Available advanced directives: In ACP-HCPOA indicating Tanya Roy, B. Tanya Roy KYM [facility] Labs: Elevated creatinine 1.40 assessed for opioid prescribing and prognostication, elevated WBC 25.9, low albumin  2.8 assessed for overall health, nutritional status, and disease severity, helping predict prognosis and guide care   Received report from primary RN -no acute concerns.  Per RN patient was restless this morning and Ativan  was administered.  Went to visit  patient at bedside -sister/HCPOA/Tanya present. Patient was lying in bed unresponsive. No signs or non-verbal gestures of pain or discomfort noted. No respiratory distress, increased work of breathing, or secretions noted.  She is on 2 L O2 nasal cannula  --------------------------------------------------------------------------------------------  Advance Care Planning Conversation   Pertinent diagnoses: Pulmonary embolism, AKI, pneumonia, altered mental status, leukocytosis  The HCPOA consented to a voluntary Advance Care Planning conversation in person. Individuals present for the conversation: Tanya Roy/sister/HCPOA   Summary of the conversation:   Met with Tanya  to discuss diagnosis, prognosis, GOC, EOL wishes, disposition, and options.  I introduced Palliative Medicine as specialized medical care for people living with serious illness. It focuses on providing relief from the symptoms and stress of a serious illness. The goal is to improve quality of life for both the patient and the family.  We discussed a brief life review of the patient as well as functional and nutritional status.  Therapeutic listening provided as Tanya reflects on patient's decline and recent hospitalization.  Tanya confirms goals for full comfort focused care for end of life.  Today is patient's birthday and Tanya feels it would be full circle if patient passed away on her birthday.  Tanya tells me patient has been seeing deceased relatives. Tanya has also noted patient's skin is now more pale than usual and also noticed breathing changes.  Education provided on the natural trajectory at end-of-life.  Discussed prognosis -likely hours to less than 24 hours.  We discussed previous goals for patient's discharge to hospice home in [place].  After discussing prognosis Tanya would prefer patient remain in house for end-of-life care -she does not want to take the risk of patient passing away during transport.  Discussed oxygen  can be considered a life-prolonging intervention -discussed removing oxygen today and allowing nature to take its course, while supporting any uncomfortable symptoms with comfort medications. Tanya opts to wean patient to room air today.  2 L O2 nasal cannula removed.   Encouraged any family/friends that wish to visit, sooner than later. Tanya states they have already been notified.  Visit also consisted of discussions dealing with the complex and emotionally intense issues of symptom management and palliative care in the setting of serious and life-threatening illness.  Discussed with Tanya the importance of continued conversation with the medical providers regarding overall plan of care and treatment options, ensuring decisions are within the context of the patients values and GOCs.    Questions and concerns were addressed. The patient/family was encouraged to call with questions and/or concerns. PMT card was provided.   Outcome of the conversation: -continue full comfort care -anticipate hospital death. Family not interested in hospice transfer -oxygen removed to room air   I spent 30 minutes providing separately identifiable ACP services with the patient and/or surrogate decision maker in a voluntary, in-person conversation discussing the patient's wishes and goals as detailed in the above note.   Primary Decision Maker: HCPOA/Tanya Roy - sister    SUMMARY OF RECOMMENDATIONS   Continue full comfort care Continue DNR/DNI as previously documented Anticipate hospital death - HCPOA not interested in patient's transfer to hospice facility Continue current comfort focused medication regimen as noted below - no changes Added orders to reflect full comfort measures, as well as discontinued orders that were not focused on comfort Unrestricted visitation orders were placed per current [facility] EOL visitation policy  PMT will continue to follow and support holistically  Symptom  Management Dilaudid  as needed pain/dyspnea/increased work of breathing/RR>25 Tylenol  PRN pain/fever Albuterol  as needed wheezing Biotin as needed dry mouth Benadryl  PRN itching Robinul  PRN secretions Haldol  PRN agitation/delirium Ativan  PRN anxiety/seizure/sleep/distress Zofran  PRN nausea/vomiting Liquifilm Tears PRN dry eye    Code Status/Advance Care Planning: DNR  Palliative Prophylaxis:  Aspiration, Delirium Protocol, Frequent Pain Assessment, Oral Care, and Turn Reposition  Additional Recommendations (Limitations, Scope, Preferences): Full Comfort Care  Psycho-social/Spiritual:  Desire for further Chaplaincy support:no Created space and opportunity for patient and family to express thoughts and feelings regarding patient's current medical situation.  Emotional support and therapeutic listening provided.  Prognosis:  Hours - <24 hours  Discharge Planning: Anticipated Hospital Death      Primary Diagnoses: Present on Admission:  Pulmonary embolism (HCC)   I have reviewed the medical record, interviewed the patient and family, and examined the patient. The following aspects are pertinent.  Past Medical History:  Diagnosis Date   Abnormal mammogram of right breast 06/26/2010   Microcalcifications   Arthritis    Confusion 02/14/2021   start before STEMI acc. to sister Tanya   Myocardial infarction [facility])    Osteoporosis 2020   Social History   Socioeconomic History   Marital status: Widowed    Spouse name: Not on file   Number of children: Not on file   Years of education: Not on file   Highest education level: Not  on file  Occupational History   Not on file  Tobacco Use   Smoking status: Never   Smokeless tobacco: Never  Vaping Use   Vaping status: Never Used  Substance and Sexual Activity   Alcohol  use: No    Alcohol /week: 0.0 standard drinks of alcohol    Drug use: No   Sexual activity: Not Currently  Other Topics Concern   Not on file  Social  History Narrative   [facility].    Social Drivers of Health   Tobacco Use: Low Risk (06/18/2024)   Patient History    Smoking Tobacco Use: Never    Smokeless Tobacco Use: Never    Passive Exposure: Not on file  Financial Resource Strain: Not on file  Food Insecurity: No Food Insecurity (06/18/2024)   Epic    Worried About Programme Researcher, Broadcasting/film/video in the Last Year: Never true    Ran Out of Food in the Last Year: Never true  Transportation Needs: No Transportation Needs (06/18/2024)   Epic    Lack of Transportation (Medical): No    Lack of Transportation (Non-Medical): No  Physical Activity: Not on file  Stress: Not on file  Social Connections: Socially Isolated (06/18/2024)   Social Connection and Isolation Panel    Frequency of Communication with Friends and Family: More than three times a week    Frequency of Social Gatherings with Friends and Family: Twice a week    Attends Religious Services: Patient unable to answer    Active Member of Clubs or Organizations: No    Attends Banker Meetings: Never    Marital Status: Widowed  Depression (PHQ2-9): Not on file  Alcohol  Screen: Not on file  Housing: Low Risk (06/18/2024)   Epic    Unable to Pay for Housing in the Last Year: No    Number of Times Moved in the Last Year: 0    Homeless in the Last Year: No  Utilities: Not At Risk (06/18/2024)   Epic    Threatened with loss of utilities: No  Health Literacy: Not on file   Family History  Problem Relation Age of Onset   Tuberculosis Mother    Heart disease Father    Breast cancer Neg Hx    Scheduled Meds: Continuous Infusions:  sodium chloride      PRN Meds:.acetaminophen  **OR** acetaminophen , albuterol , antiseptic oral rinse, artificial tears, diphenhydrAMINE , glycopyrrolate  **OR** glycopyrrolate  **OR** glycopyrrolate , haloperidol  **OR** haloperidol  **OR** haloperidol  lactate, HYDROmorphone  (DILAUDID ) injection, LORazepam   **OR** LORazepam  **OR** LORazepam , ondansetron  **OR** ondansetron  (ZOFRAN ) IV Medications Prior to Admission:  Prior to Admission medications  Medication Sig Start Date End Date Taking? Authorizing Provider  acetaminophen  (TYLENOL ) 500 MG tablet Take 2 tablets (1,000 mg total) by mouth every 8 (eight) hours. 06/11/24   Vann, Jessica U, DO  aspirin  81 MG chewable tablet Chew 1 tablet (81 mg total) by mouth 2 (two) times daily. 06/11/24   Vann, Jessica U, DO  atorvastatin  (LIPITOR ) 20 MG tablet Take 20 mg by mouth at bedtime. 05/25/23   [provider]  Calcium  Carbonate-Vitamin D  (CALCIUM  600-D PO) Take 1 tablet by mouth in the morning and at bedtime.    [provider]  carvedilol  (COREG ) 6.25 MG tablet TAKE 1 TABLET BY MOUTH 2 TIMES PER DAY 04/02/23   Darron Deatrice LABOR, MD  Cyanocobalamin (B-12) 1000 MCG CAPS Take 1 capsule by mouth daily. 02/12/23   [provider]  ENTRESTO  24-26 MG TAKE 1 TABLE  BY MOUTH 2 TIMES DAILY 08/11/22   Darron Deatrice LABOR, MD  mirtazapine  (REMERON ) 7.5 MG tablet Take 7.5 mg by mouth at bedtime.    [provider]  Nutritional Supplements (ENSURE ORIGINAL) LIQD Take 1 Bottle by mouth in the morning and at bedtime.    [provider]  oxyCODONE  (OXY IR/ROXICODONE ) 5 MG immediate release tablet Take 0.5-1 tablets (2.5-5 mg total) by mouth every 4 (four) hours as needed for moderate pain (pain score 4-6) or severe pain (pain score 7-10). 06/11/24   Vann, Jessica U, DO  polyethylene glycol (MIRALAX  / GLYCOLAX ) 17 g packet Take 17 g by mouth daily.    [provider]   Allergies[1] Review of Systems  Unable to perform ROS: Acuity of condition    Physical Exam Vitals and nursing note reviewed.  Constitutional:      General: She is not in acute distress.    Appearance: She is ill-appearing.  Pulmonary:     Effort: Pulmonary effort is normal. No respiratory distress.  Skin:    General: Skin is warm and dry.   Neurological:     Mental Status: She is unresponsive.     Vital Signs: BP (!) 113/45   Pulse 90   Temp 98.6 F (37 C) (Axillary)   Resp (!) 25   SpO2 (!) 78%  Pain Scale: PAINAD       SpO2: SpO2: (!) 78 % O2 Device:SpO2: (!) 78 % O2 Flow Rate: .O2 Flow Rate (L/min): 2 L/min  IO: Intake/output summary:  Intake/Output Summary (Last 24 hours) at 06-27-24 0732 Last data filed at 06/18/2024 1847 Gross per 24 hour  Intake 1342.65 ml  Output --  Net 1342.65 ml    LBM: Last BM Date : 06/18/24 Baseline Weight:   Most recent weight:       Palliative Assessment/Data: PPS 10%     Billing based on MDM: High  Problems Addressed: One acute or chronic illness or injury that poses a threat to life or bodily function  Amount and/or Complexity of Data: Category 1:Review of prior external note(s) from each unique source, Review of the result(s) of each unique test, and Assessment requiring an independent historian(s) and Category 3:Discussion of management or test interpretation with external physician/other qualified health care professional/appropriate source (not separately reported)  Risks: Parenteral controlled substances   Signed by: Jeoffrey CHRISTELLA Sharps, NP   Please contact Palliative Medicine Team phone at [phone] for questions and concerns.  For individual provider: See Amion  *Portions of this note are a verbal dictation therefore any spelling and/or grammatical errors are due to the Dragon Medical One system interpretation.      [1] No Known Allergies  "

## 2024-06-26 NOTE — TOC Initial Note (Addendum)
 Transition of Care [facility]) - Initial/Assessment Note    Patient Details  Name: Tanya Roy MRN: [id number] Date of Birth: [birth year]  Transition [facility] CM/SW Contact:    Lauraine FORBES Saa, LCSWA Phone Number: 24-Jun-2024, 9:23 AM  Clinical Narrative:                  9:23 AM Per chart review, patient was admitted from [facility] where she was admitted at for approximately one week. TOC consult was placed for residential hospice placement. Medical team informed CSW that patient's family no longer wants patient to transfer out of hospital, and that patient is anticipated to have an in hospital death. TOC will continue to follow.  Expected Discharge Plan: Hospice Medical Facility Barriers to Discharge: Continued Medical Work up   Patient Goals and CMS Choice            Expected Discharge Plan and Services In-house Referral: Clinical Social Work   Post Acute Care Choice: Hospice Living arrangements for the past 2 months: Assisted Living Facility, Skilled Nursing Facility                                      Prior Living Arrangements/Services Living arrangements for the past 2 months: Assisted Press Photographer, Skilled Nursing Facility Lives with:: Facility Resident Patient language and need for interpreter reviewed:: Yes        Need for Family Participation in Patient Care: Yes (Comment)     Criminal Activity/Legal Involvement Pertinent to Current Situation/Hospitalization: No - Comment as needed  Activities of Daily Living   ADL Screening (condition at time of admission) Independently performs ADLs?: No Does the patient have a NEW difficulty with bathing/dressing/toileting/self-feeding that is expected to last >3 days?: Yes (Initiates electronic notice to provider for possible OT consult) Does the patient have a NEW difficulty with getting in/out of bed, walking, or climbing stairs that is expected to last >3 days?: Yes (Initiates  electronic notice to provider for possible PT consult) Does the patient have a NEW difficulty with communication that is expected to last >3 days?: Yes (Initiates electronic notice to provider for possible SLP consult) Is the patient deaf or have difficulty hearing?: No Does the patient have difficulty seeing, even when wearing glasses/contacts?: No Does the patient have difficulty concentrating, remembering, or making decisions?: Yes  Permission Sought/Granted Permission sought to share information with : Family Supports Permission granted to share information with : No (Contact information on chart)  Share Information with NAME: Lindajo Lenon     Permission granted to share info w Relationship: Sister  Permission granted to share info w Contact Information: [phone]  Emotional Assessment   Attitude/Demeanor/Rapport: Unable to Assess Affect (typically observed): Unable to Assess Orientation: : Oriented to Self Alcohol  / Substance Use: Not Applicable Psych Involvement: No (comment)  Admission diagnosis:  Pulmonary embolism (HCC) [I26.99] Hypoxia [R09.02] Elevated troponin [R79.89] Pneumonia due to infectious organism, unspecified laterality, unspecified part of lung [J18.9] Other acute pulmonary embolism, unspecified whether acute cor pulmonale present (HCC) [I26.99] Patient Active Problem List   Diagnosis Date Noted   Pulmonary embolism (HCC) 06/18/2024   Closed displaced spiral fracture of shaft of right femur (HCC) 06/07/2024   Right femoral fracture (HCC) 06/07/2024   History of ST elevation myocardial infarction (STEMI) 03/15/2021   Chronic HFrEF (heart failure with reduced ejection fraction) (HCC) 03/15/2021   CAD in native artery  03/15/2021   Generalized weakness 03/15/2021   CAD S/P percutaneous coronary angioplasty 03/14/2021   Dilated cardiomyopathy (HCC) 02/17/2021   Essential hypertension 02/17/2021   Hyperlipidemia 02/17/2021   Delirium 02/17/2021   Acute  HFrEF (heart failure with reduced ejection fraction) (HCC)    STEMI (ST elevation myocardial infarction) (HCC) 02/14/2021   Hip fracture (HCC) 08/29/2016   Melanoma of skin (HCC) 03/24/2015   OP (osteoporosis) 03/04/2015   Arthritis, degenerative 02/24/2014   PCP:  Diedra Lame, MD Pharmacy:   [facility] [place] - [address] [place] [place] [address] Phone: [phone] Fax: [phone]  [facility] - [place], [place] - [address] [place] [address] Phone: [phone] Fax: [phone]     Social Drivers of Health (SDOH) Social History: SDOH Screenings   Food Insecurity: No Food Insecurity (06/18/2024)  Housing: Low Risk (06/18/2024)  Transportation Needs: No Transportation Needs (06/18/2024)  Utilities: Not At Risk (06/18/2024)  Social Connections: Socially Isolated (06/18/2024)  Tobacco Use: Low Risk (06/18/2024)   SDOH Interventions:     Readmission Risk Interventions     No data to display

## 2024-06-26 NOTE — Plan of Care (Signed)

## 2024-06-26 DEATH — deceased

## 2024-06-30 ENCOUNTER — Encounter: Admitting: Orthopedic Surgery

## 2024-06-30 ENCOUNTER — Telehealth: Payer: Self-pay | Admitting: *Deleted

## 2024-06-30 NOTE — Telephone Encounter (Signed)
 Copied from CRM #[id number]. Topic: General - Deceased Patient >> 07-03-24 11:02 AM Miquel SAILOR wrote: Name of caller: [name]   Date of death: 22-Jun-2024   Name of funeral home: [employer] [facility]    Phone number of funeral home: [phone]  Provider that needs to sign form: Roya Tawkaliyar  Timeline for signing: 5 days

## 2024-07-01 NOTE — Telephone Encounter (Signed)
 The funeral home stated they need the death certificate signed.

## 2024-07-01 NOTE — Telephone Encounter (Unsigned)
 Copied from CRM #[id number]. Topic: General - Deceased Patient >> Jul 09, 2024 11:02 AM Miquel SAILOR wrote: Name of caller: [name]   Date of death: 06/28/2024   Name of funeral home: [employer] [facility]    Phone number of funeral home: [phone]  Provider that needs to sign form: Roya Heddy  Timeline for signing: 5 days >> Jul 01, 2024  2:21 PM Miquel SAILOR wrote: [name] from [phone]:Calling on update due to PT Deceased. Called office and transferred to [name]
